# Patient Record
Sex: Female | Born: 1945 | Race: White | Hispanic: No | Marital: Married | Smoking: Never smoker
Health system: Southern US, Community
[De-identification: ages and names within clinical notes are randomized; demographics above are authoritative.]

## PROBLEM LIST (undated history)

## (undated) DIAGNOSIS — Z923 Personal history of irradiation: Secondary | ICD-10-CM

## (undated) DIAGNOSIS — Z9221 Personal history of antineoplastic chemotherapy: Secondary | ICD-10-CM

## (undated) DIAGNOSIS — Z978 Presence of other specified devices: Secondary | ICD-10-CM

## (undated) DIAGNOSIS — K219 Gastro-esophageal reflux disease without esophagitis: Secondary | ICD-10-CM

## (undated) DIAGNOSIS — T884XXA Failed or difficult intubation, initial encounter: Secondary | ICD-10-CM

## (undated) DIAGNOSIS — Z931 Gastrostomy status: Secondary | ICD-10-CM

## (undated) DIAGNOSIS — T7840XA Allergy, unspecified, initial encounter: Secondary | ICD-10-CM

## (undated) DIAGNOSIS — R42 Dizziness and giddiness: Secondary | ICD-10-CM

## (undated) DIAGNOSIS — C801 Malignant (primary) neoplasm, unspecified: Secondary | ICD-10-CM

## (undated) DIAGNOSIS — E079 Disorder of thyroid, unspecified: Secondary | ICD-10-CM

## (undated) DIAGNOSIS — I10 Essential (primary) hypertension: Secondary | ICD-10-CM

## (undated) HISTORY — PX: ABDOMINAL HYSTERECTOMY: SHX81

## (undated) HISTORY — PX: BREAST BIOPSY: SHX20

## (undated) HISTORY — DX: Allergy, unspecified, initial encounter: T78.40XA

## (undated) HISTORY — PX: OTHER SURGICAL HISTORY: SHX169

---

## 1996-07-22 DIAGNOSIS — C801 Malignant (primary) neoplasm, unspecified: Secondary | ICD-10-CM

## 1996-07-22 HISTORY — PX: RADICAL NECK DISSECTION: SHX2284

## 1996-07-22 HISTORY — PX: OTHER SURGICAL HISTORY: SHX169

## 1996-07-22 HISTORY — DX: Malignant (primary) neoplasm, unspecified: C80.1

## 1997-07-22 DIAGNOSIS — Z9221 Personal history of antineoplastic chemotherapy: Secondary | ICD-10-CM

## 1997-07-22 HISTORY — DX: Personal history of antineoplastic chemotherapy: Z92.21

## 2005-09-05 ENCOUNTER — Ambulatory Visit: Payer: Self-pay | Admitting: Family Medicine

## 2007-07-06 ENCOUNTER — Ambulatory Visit: Payer: Self-pay | Admitting: Family Medicine

## 2008-05-13 ENCOUNTER — Ambulatory Visit: Payer: Self-pay | Admitting: Family Medicine

## 2009-01-18 ENCOUNTER — Ambulatory Visit: Payer: Self-pay | Admitting: Family Medicine

## 2009-03-10 ENCOUNTER — Ambulatory Visit: Payer: Self-pay | Admitting: Otolaryngology

## 2009-03-13 ENCOUNTER — Ambulatory Visit: Payer: Self-pay | Admitting: Otolaryngology

## 2009-03-20 ENCOUNTER — Ambulatory Visit: Payer: Self-pay | Admitting: Otolaryngology

## 2009-04-04 ENCOUNTER — Ambulatory Visit: Payer: Self-pay | Admitting: Oncology

## 2009-04-21 ENCOUNTER — Ambulatory Visit: Payer: Self-pay | Admitting: Oncology

## 2009-04-25 ENCOUNTER — Ambulatory Visit: Payer: Self-pay | Admitting: Oncology

## 2009-05-01 ENCOUNTER — Ambulatory Visit: Payer: Self-pay | Admitting: Oncology

## 2009-05-22 ENCOUNTER — Ambulatory Visit: Payer: Self-pay | Admitting: Oncology

## 2009-10-20 ENCOUNTER — Ambulatory Visit: Payer: Self-pay

## 2009-11-07 ENCOUNTER — Ambulatory Visit: Payer: Self-pay

## 2009-11-19 ENCOUNTER — Ambulatory Visit: Payer: Self-pay

## 2010-04-21 ENCOUNTER — Ambulatory Visit: Payer: Self-pay | Admitting: Oncology

## 2010-05-09 ENCOUNTER — Ambulatory Visit: Payer: Self-pay | Admitting: Oncology

## 2010-05-22 ENCOUNTER — Ambulatory Visit: Payer: Self-pay | Admitting: Oncology

## 2011-03-03 ENCOUNTER — Ambulatory Visit: Payer: Self-pay

## 2011-07-17 DIAGNOSIS — M810 Age-related osteoporosis without current pathological fracture: Secondary | ICD-10-CM | POA: Insufficient documentation

## 2011-10-09 DIAGNOSIS — C050 Malignant neoplasm of hard palate: Secondary | ICD-10-CM | POA: Insufficient documentation

## 2011-10-09 DIAGNOSIS — C01 Malignant neoplasm of base of tongue: Secondary | ICD-10-CM | POA: Insufficient documentation

## 2012-10-01 ENCOUNTER — Ambulatory Visit: Payer: Self-pay | Admitting: Family Medicine

## 2014-04-27 DIAGNOSIS — E039 Hypothyroidism, unspecified: Secondary | ICD-10-CM | POA: Insufficient documentation

## 2014-05-23 DIAGNOSIS — R1312 Dysphagia, oropharyngeal phase: Secondary | ICD-10-CM | POA: Insufficient documentation

## 2014-07-12 ENCOUNTER — Ambulatory Visit: Payer: Self-pay | Admitting: Otolaryngology

## 2014-09-27 ENCOUNTER — Ambulatory Visit: Payer: Self-pay | Admitting: Physician Assistant

## 2014-12-14 DIAGNOSIS — H9190 Unspecified hearing loss, unspecified ear: Secondary | ICD-10-CM | POA: Insufficient documentation

## 2014-12-14 DIAGNOSIS — H6980 Other specified disorders of Eustachian tube, unspecified ear: Secondary | ICD-10-CM | POA: Insufficient documentation

## 2014-12-14 DIAGNOSIS — K1370 Unspecified lesions of oral mucosa: Secondary | ICD-10-CM | POA: Insufficient documentation

## 2015-02-08 ENCOUNTER — Other Ambulatory Visit: Payer: Self-pay | Admitting: Pediatrics

## 2015-02-08 DIAGNOSIS — Z1231 Encounter for screening mammogram for malignant neoplasm of breast: Secondary | ICD-10-CM

## 2015-02-09 ENCOUNTER — Ambulatory Visit
Admission: RE | Admit: 2015-02-09 | Discharge: 2015-02-09 | Disposition: A | Discharge status: Home / Self Care | Payer: Medicare PPO | Source: Ambulatory Visit | Attending: Pediatrics | Admitting: Pediatrics

## 2015-02-09 DIAGNOSIS — Z1231 Encounter for screening mammogram for malignant neoplasm of breast: Secondary | ICD-10-CM | POA: Diagnosis not present

## 2015-02-09 HISTORY — DX: Malignant (primary) neoplasm, unspecified: C80.1

## 2015-04-18 DIAGNOSIS — E079 Disorder of thyroid, unspecified: Secondary | ICD-10-CM | POA: Insufficient documentation

## 2016-02-13 DIAGNOSIS — J324 Chronic pansinusitis: Secondary | ICD-10-CM | POA: Insufficient documentation

## 2016-04-16 ENCOUNTER — Ambulatory Visit
Admission: RE | Admit: 2016-04-16 | Discharge: 2016-04-16 | Disposition: A | Discharge status: Home / Self Care | Payer: Medicare PPO | Source: Ambulatory Visit | Attending: Specialist | Admitting: Specialist

## 2016-04-16 ENCOUNTER — Other Ambulatory Visit: Payer: Self-pay | Admitting: Specialist

## 2016-04-16 DIAGNOSIS — R05 Cough: Secondary | ICD-10-CM

## 2016-04-16 DIAGNOSIS — R059 Cough, unspecified: Secondary | ICD-10-CM

## 2016-04-30 ENCOUNTER — Other Ambulatory Visit: Payer: Self-pay | Admitting: Specialist

## 2016-04-30 DIAGNOSIS — R05 Cough: Secondary | ICD-10-CM

## 2016-04-30 DIAGNOSIS — R9389 Abnormal findings on diagnostic imaging of other specified body structures: Secondary | ICD-10-CM

## 2016-04-30 DIAGNOSIS — R059 Cough, unspecified: Secondary | ICD-10-CM

## 2016-05-02 DIAGNOSIS — Z431 Encounter for attention to gastrostomy: Secondary | ICD-10-CM | POA: Insufficient documentation

## 2016-05-02 DIAGNOSIS — Z931 Gastrostomy status: Secondary | ICD-10-CM | POA: Insufficient documentation

## 2016-06-17 DIAGNOSIS — Z431 Encounter for attention to gastrostomy: Secondary | ICD-10-CM | POA: Insufficient documentation

## 2016-06-28 ENCOUNTER — Encounter: Payer: Self-pay | Admitting: Family Medicine

## 2016-06-28 ENCOUNTER — Ambulatory Visit (INDEPENDENT_AMBULATORY_CARE_PROVIDER_SITE_OTHER): Payer: Medicare PPO | Admitting: Family Medicine

## 2016-06-28 ENCOUNTER — Other Ambulatory Visit: Payer: Self-pay

## 2016-06-28 VITALS — BP 98/78 | HR 72 | Resp 16 | Ht 59.0 in | Wt 104.0 lb

## 2016-06-28 DIAGNOSIS — E039 Hypothyroidism, unspecified: Secondary | ICD-10-CM | POA: Diagnosis not present

## 2016-06-28 DIAGNOSIS — M7582 Other shoulder lesions, left shoulder: Secondary | ICD-10-CM | POA: Diagnosis not present

## 2016-06-28 DIAGNOSIS — C050 Malignant neoplasm of hard palate: Secondary | ICD-10-CM | POA: Diagnosis not present

## 2016-06-28 DIAGNOSIS — Z79899 Other long term (current) drug therapy: Secondary | ICD-10-CM | POA: Diagnosis not present

## 2016-06-28 DIAGNOSIS — C801 Malignant (primary) neoplasm, unspecified: Secondary | ICD-10-CM | POA: Insufficient documentation

## 2016-06-28 DIAGNOSIS — J309 Allergic rhinitis, unspecified: Secondary | ICD-10-CM

## 2016-06-28 DIAGNOSIS — Z931 Gastrostomy status: Secondary | ICD-10-CM | POA: Diagnosis not present

## 2016-06-28 DIAGNOSIS — I10 Essential (primary) hypertension: Secondary | ICD-10-CM | POA: Insufficient documentation

## 2016-06-28 DIAGNOSIS — E079 Disorder of thyroid, unspecified: Secondary | ICD-10-CM | POA: Insufficient documentation

## 2016-06-28 NOTE — Progress Notes (Addendum)
Date:  06/28/2016   Name:  Crystal Kent   DOB:  10/05/1945   MRN:  [id number]  PCP:  Adline Potter, MD    Chief Complaint: Establish Care and Arm Pain (left arm pain off and on x few weeks. No injury involved. ROM is different )   History of Present Illness:  This is a 70 y.o. female seen for initial visit, was seeing [facility] primary care. Sees [facility] for hx multiple oral cancers and [facility] surgery for PEG tube maintenance, on tube feedings for years. BP usually elevated at MD's office, 157/89 last month at [facility]. Uses Flonase prn allergies. TSH last month normal. On Prilosec since PEG placed. Has had flu, pneumo, and zoster imms, colonoscopy < 3 yrs ago, last mammo 01/2015. C/o anterior L shoulder pain x 3 wks after playing golf for first time in a while, slowly improving. No other new concerns.  Review of Systems:  Review of Systems  Constitutional: Negative for activity change, appetite change, fever and unexpected weight change.  Eyes: Negative for pain.  Respiratory: Negative for cough and shortness of breath.   Cardiovascular: Negative for chest pain, palpitations and leg swelling.  Gastrointestinal: Negative for abdominal pain.  Endocrine: Negative for polyuria.  Genitourinary: Negative for difficulty urinating, flank pain and pelvic pain.  Musculoskeletal: Negative for back pain and neck pain.  Neurological: Negative for dizziness, tremors and syncope.  Hematological: Negative for adenopathy.  Psychiatric/Behavioral: Negative for agitation and confusion.    Patient Active Problem List   Diagnosis Date Noted  . 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 Hypertension 06/28/2016  . Allergic rhinitis 06/28/2016  . Current use of proton pump inhibitor 06/28/2016  . Attention to G-tube ([place]) 06/17/2016  . Attention to gastrostomy tube ([place]) 05/02/2016  . 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 Chronic pansinusitis 02/13/2016  . Eustachian tube dysfunction 12/14/2014  .  Hearing loss 12/14/2014  . Lesion of buccal mucosa 12/14/2014  . Dysphagia, oropharyngeal 05/23/2014  . Hypothyroidism 04/27/2014  . 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 Post-menopausal osteoporosis 07/17/2011    Prior to Admission medications   Medication Sig Start Date End Date Taking? Authorizing Provider  amLODipine (NORVASC) 5 MG tablet TAKE ONE TABLET BY MOUTH EVERY DAY 06/25/12  Yes Historical Provider, MD  fluticasone (FLONASE) 50 MCG/ACT nasal spray 2 sprays per nostril every morning 04/27/13  Yes Historical Provider, MD  levothyroxine (SYNTHROID, LEVOTHROID) 75 MCG tablet TAKE ONE TABLET BY MOUTH EVERY DAY 06/25/12  Yes Historical Provider, MD  nystatin cream (MYCOSTATIN) once daily as needed.  05/02/16  Yes Historical Provider, MD  omeprazole (PRILOSEC) 20 MG capsule TAKE ONE CAPSULE BY MOUTH EVERY DAY 10/25/11  Yes Historical Provider, MD    No Known Allergies  Past Surgical History:  Procedure Laterality Date  . BREAST BIOPSY Left    bx/clip-neg    Social History  Substance Use Topics  . Smoking status: Never Smoker  . Smokeless tobacco: Never Used  . Alcohol use No    Family History  Problem Relation Age of Onset  . Alzheimer's disease Mother   . Heart attack Father     Medication list has been reviewed and updated.  Physical Examination: BP 98/78   Pulse 72   Resp 16   Ht 4\' 11"  (1.499 m)   Wt 104 lb (47.2 kg)   BMI 21.01 kg/m  Physical Exam  Constitutional: She is oriented to person, place, and time. She appears well-developed and well-nourished.  HENT:  S/p multiple oral surgeries B TM's with extensive scarring  Eyes: Conjunctivae and EOM are normal. Pupils are equal, round, and reactive to light. No scleral icterus.  Neck: Neck supple. No thyromegaly present.  Cardiovascular: Normal rate, regular rhythm and normal heart sounds.   Exam reveals no gallop.   No murmur heard. Pulmonary/Chest: Effort normal and breath sounds normal. She has no wheezes. She has no rales.  Abdominal: Soft. Bowel sounds are normal. She exhibits no distension and no mass. There is no tenderness.  PEG tube in place, site clean  Musculoskeletal: She exhibits no edema.  L anterior rotator cuff tenderness, increased pain with abduction > 90 degrees  Lymphadenopathy:    She has no cervical adenopathy.  Neurological: She is alert and oriented to person, place, and time. Coordination normal.  Skin: Skin is warm and dry. No rash noted.  Psychiatric: She has a normal mood and affect. Her behavior is normal.  Nursing note and vitals reviewed.   Assessment and Plan:  1. Essential hypertension Overcontrolled today but poorly controlled recently, cont current dose amlodipine - Comprehensive Metabolic Panel (CMET) - CBC  2. Acquired hypothyroidism Well controlled on current dose Synthroid - Vitamin D (25 hydroxy)  3. Malignant neoplasm of hard palate [facility]) S/p multiple resections, followed by [facility] ENT  4. Gastrostomy in place [facility]) In place for years, followed by [facility] surgery  5. Chronic allergic rhinitis, unspecified seasonality, unspecified trigger Well controlled on prn Flonase  6. Current use of proton pump inhibitor Continue Prilosec given LT PEG use - B12  7. L rotator cuff tendonitis Expect resolution as no golf over winter, consider PT referral if persists  Return in about 6 months (around 12/27/2016).  [name]. [facility]  06/28/2016

## 2016-06-29 LAB — VITAMIN D 25 HYDROXY (VIT D DEFICIENCY, FRACTURES): VIT D 25 HYDROXY: 36.8 ng/mL (ref 30.0–100.0)

## 2016-06-29 LAB — COMPREHENSIVE METABOLIC PANEL
ALBUMIN: 4.8 g/dL (ref 3.5–4.8)
ALK PHOS: 118 IU/L — AB (ref 39–117)
ALT: 27 IU/L (ref 0–32)
AST: 36 IU/L (ref 0–40)
Albumin/Globulin Ratio: 2.1 (ref 1.2–2.2)
BILIRUBIN TOTAL: 0.6 mg/dL (ref 0.0–1.2)
BUN / CREAT RATIO: 26 (ref 12–28)
BUN: 18 mg/dL (ref 8–27)
CHLORIDE: 95 mmol/L — AB (ref 96–106)
CO2: 26 mmol/L (ref 18–29)
CREATININE: 0.7 mg/dL (ref 0.57–1.00)
Calcium: 9.5 mg/dL (ref 8.7–10.3)
GFR calc non Af Amer: 87 mL/min/{1.73_m2} (ref >59)
GFR, EST AFRICAN AMERICAN: 101 mL/min/{1.73_m2} (ref >59)
GLOBULIN, TOTAL: 2.3 g/dL (ref 1.5–4.5)
Glucose: 85 mg/dL (ref 65–99)
Potassium: 4.2 mmol/L (ref 3.5–5.2)
SODIUM: 136 mmol/L (ref 134–144)
Total Protein: 7.1 g/dL (ref 6.0–8.5)

## 2016-06-29 LAB — CBC
HEMATOCRIT: 35.1 % (ref 34.0–46.6)
Hemoglobin: 12 g/dL (ref 11.1–15.9)
MCH: 30.7 pg (ref 26.6–33.0)
MCHC: 34.2 g/dL (ref 31.5–35.7)
MCV: 90 fL (ref 79–97)
Platelets: 194 10*3/uL (ref 150–379)
RBC: 3.91 x10E6/uL (ref 3.77–5.28)
RDW: 13.6 % (ref 12.3–15.4)
WBC: 3.7 10*3/uL (ref 3.4–10.8)

## 2016-06-29 LAB — VITAMIN B12: VITAMIN B 12: 1096 pg/mL (ref 232–1245)

## 2016-07-25 ENCOUNTER — Ambulatory Visit: Payer: Self-pay | Admitting: Family Medicine

## 2016-07-26 ENCOUNTER — Emergency Department: Payer: Medicare HMO

## 2016-07-26 ENCOUNTER — Encounter: Payer: Self-pay | Admitting: Emergency Medicine

## 2016-07-26 ENCOUNTER — Inpatient Hospital Stay
Admission: EM | Admit: 2016-07-26 | Discharge: 2016-07-27 | DRG: 535 | Disposition: A | Discharge status: Home / Self Care | Payer: Medicare HMO | Attending: Internal Medicine | Admitting: Internal Medicine

## 2016-07-26 DIAGNOSIS — S32602A Unspecified fracture of left ischium, initial encounter for closed fracture: Secondary | ICD-10-CM

## 2016-07-26 DIAGNOSIS — I959 Hypotension, unspecified: Secondary | ICD-10-CM | POA: Diagnosis not present

## 2016-07-26 DIAGNOSIS — J189 Pneumonia, unspecified organism: Secondary | ICD-10-CM | POA: Diagnosis present

## 2016-07-26 DIAGNOSIS — Z79899 Other long term (current) drug therapy: Secondary | ICD-10-CM

## 2016-07-26 DIAGNOSIS — E079 Disorder of thyroid, unspecified: Secondary | ICD-10-CM | POA: Diagnosis not present

## 2016-07-26 DIAGNOSIS — Z931 Gastrostomy status: Secondary | ICD-10-CM

## 2016-07-26 DIAGNOSIS — K219 Gastro-esophageal reflux disease without esophagitis: Secondary | ICD-10-CM | POA: Diagnosis present

## 2016-07-26 DIAGNOSIS — Y92009 Unspecified place in unspecified non-institutional (private) residence as the place of occurrence of the external cause: Secondary | ICD-10-CM | POA: Diagnosis not present

## 2016-07-26 DIAGNOSIS — J159 Unspecified bacterial pneumonia: Secondary | ICD-10-CM | POA: Diagnosis not present

## 2016-07-26 DIAGNOSIS — W010XXA Fall on same level from slipping, tripping and stumbling without subsequent striking against object, initial encounter: Secondary | ICD-10-CM | POA: Diagnosis present

## 2016-07-26 DIAGNOSIS — Z9221 Personal history of antineoplastic chemotherapy: Secondary | ICD-10-CM | POA: Diagnosis not present

## 2016-07-26 DIAGNOSIS — Z85810 Personal history of malignant neoplasm of tongue: Secondary | ICD-10-CM | POA: Diagnosis not present

## 2016-07-26 DIAGNOSIS — R2681 Unsteadiness on feet: Secondary | ICD-10-CM

## 2016-07-26 DIAGNOSIS — T461X5A Adverse effect of calcium-channel blockers, initial encounter: Secondary | ICD-10-CM | POA: Diagnosis present

## 2016-07-26 DIAGNOSIS — C069 Malignant neoplasm of mouth, unspecified: Secondary | ICD-10-CM | POA: Diagnosis not present

## 2016-07-26 DIAGNOSIS — Z923 Personal history of irradiation: Secondary | ICD-10-CM

## 2016-07-26 DIAGNOSIS — I10 Essential (primary) hypertension: Secondary | ICD-10-CM | POA: Diagnosis present

## 2016-07-26 DIAGNOSIS — M25552 Pain in left hip: Secondary | ICD-10-CM | POA: Diagnosis present

## 2016-07-26 DIAGNOSIS — S32512A Fracture of superior rim of left pubis, initial encounter for closed fracture: Secondary | ICD-10-CM | POA: Diagnosis not present

## 2016-07-26 DIAGNOSIS — R55 Syncope and collapse: Secondary | ICD-10-CM | POA: Diagnosis not present

## 2016-07-26 DIAGNOSIS — S32599A Other specified fracture of unspecified pubis, initial encounter for closed fracture: Secondary | ICD-10-CM | POA: Diagnosis not present

## 2016-07-26 HISTORY — DX: Disorder of thyroid, unspecified: E07.9

## 2016-07-26 HISTORY — DX: Essential (primary) hypertension: I10

## 2016-07-26 HISTORY — DX: Gastro-esophageal reflux disease without esophagitis: K21.9

## 2016-07-26 LAB — CBC
HEMATOCRIT: 35.5 % (ref 35.0–47.0)
HEMOGLOBIN: 12.3 g/dL (ref 12.0–16.0)
MCH: 31.5 pg (ref 26.0–34.0)
MCHC: 34.6 g/dL (ref 32.0–36.0)
MCV: 91.1 fL (ref 80.0–100.0)
Platelets: 249 10*3/uL (ref 150–440)
RBC: 3.9 MIL/uL (ref 3.80–5.20)
RDW: 13 % (ref 11.5–14.5)
WBC: 20.5 10*3/uL — AB (ref 3.6–11.0)

## 2016-07-26 LAB — URINALYSIS, COMPLETE (UACMP) WITH MICROSCOPIC
BACTERIA UA: NONE SEEN
BILIRUBIN URINE: NEGATIVE
Glucose, UA: NEGATIVE mg/dL
HGB URINE DIPSTICK: NEGATIVE
KETONES UR: 5 mg/dL — AB
LEUKOCYTES UA: NEGATIVE
NITRITE: NEGATIVE
Protein, ur: 30 mg/dL — AB
SPECIFIC GRAVITY, URINE: 1.019 (ref 1.005–1.030)
pH: 7 (ref 5.0–8.0)

## 2016-07-26 LAB — BASIC METABOLIC PANEL
ANION GAP: 6 (ref 5–15)
BUN: 25 mg/dL — ABNORMAL HIGH (ref 6–20)
CHLORIDE: 100 mmol/L — AB (ref 101–111)
CO2: 27 mmol/L (ref 22–32)
Calcium: 9.7 mg/dL (ref 8.9–10.3)
Creatinine, Ser: 0.96 mg/dL (ref 0.44–1.00)
GFR calc Af Amer: >60 mL/min (ref >60)
GFR, EST NON AFRICAN AMERICAN: 58 mL/min — AB (ref >60)
Glucose, Bld: 159 mg/dL — ABNORMAL HIGH (ref 65–99)
POTASSIUM: 4.5 mmol/L (ref 3.5–5.1)
SODIUM: 133 mmol/L — AB (ref 135–145)

## 2016-07-26 MED ORDER — HYDROMORPHONE HCL 1 MG/ML IJ SOLN
1.0000 mg | Freq: Once | INTRAMUSCULAR | Status: AC
  Administered 2016-07-26: 1 mg via INTRAVENOUS
  Filled 2016-07-26: qty 1

## 2016-07-26 MED ORDER — DEXTROSE 5 % IV SOLN
500.0000 mg | INTRAVENOUS | Status: DC
  Administered 2016-07-27: 500 mg via INTRAVENOUS
  Filled 2016-07-26: qty 500

## 2016-07-26 MED ORDER — LEVOTHYROXINE SODIUM 75 MCG PO TABS
75.0000 ug | ORAL_TABLET | Freq: Every day | ORAL | Status: DC
  Administered 2016-07-27: 75 ug via ORAL
  Filled 2016-07-26: qty 1

## 2016-07-26 MED ORDER — DEXTROSE 5 % IV SOLN: 1.0000 g | INTRAVENOUS | Status: DC

## 2016-07-26 MED ORDER — PANTOPRAZOLE SODIUM 40 MG PO TBEC
40.0000 mg | DELAYED_RELEASE_TABLET | Freq: Every day | ORAL | Status: DC
  Administered 2016-07-27: 40 mg via ORAL
  Filled 2016-07-26: qty 1

## 2016-07-26 MED ORDER — DOCUSATE SODIUM 100 MG PO CAPS
100.0000 mg | ORAL_CAPSULE | Freq: Two times a day (BID) | ORAL | Status: DC
  Filled 2016-07-26 (×2): qty 1

## 2016-07-26 MED ORDER — AMLODIPINE BESYLATE 5 MG PO TABS
5.0000 mg | ORAL_TABLET | Freq: Every day | ORAL | Status: DC
  Filled 2016-07-26: qty 1

## 2016-07-26 MED ORDER — ONDANSETRON HCL 4 MG/2ML IJ SOLN: 4.0000 mg | Freq: Four times a day (QID) | INTRAMUSCULAR | Status: DC | PRN

## 2016-07-26 MED ORDER — HYDROCODONE-ACETAMINOPHEN 5-325 MG PO TABS
1.0000 | ORAL_TABLET | ORAL | Status: DC | PRN
  Administered 2016-07-27 (×2): 1
  Filled 2016-07-26 (×2): qty 1

## 2016-07-26 MED ORDER — ENOXAPARIN SODIUM 40 MG/0.4ML ~~LOC~~ SOLN
40.0000 mg | SUBCUTANEOUS | Status: DC
  Administered 2016-07-26: 40 mg via SUBCUTANEOUS
  Filled 2016-07-26: qty 0.4

## 2016-07-26 MED ORDER — BISACODYL 5 MG PO TBEC: 5.0000 mg | DELAYED_RELEASE_TABLET | Freq: Every day | ORAL | Status: DC | PRN

## 2016-07-26 MED ORDER — LIDOCAINE 5 % EX PTCH
1.0000 | MEDICATED_PATCH | CUTANEOUS | Status: DC
  Administered 2016-07-26: 1 via TRANSDERMAL
  Filled 2016-07-26 (×2): qty 1

## 2016-07-26 MED ORDER — CEFTRIAXONE SODIUM-DEXTROSE 1-3.74 GM-% IV SOLR
1.0000 g | INTRAVENOUS | Status: DC
  Filled 2016-07-26: qty 50

## 2016-07-26 MED ORDER — ONDANSETRON HCL 4 MG/2ML IJ SOLN
INTRAMUSCULAR | Status: AC
  Filled 2016-07-26: qty 2

## 2016-07-26 MED ORDER — DEXTROSE 5 % IV SOLN: 1.0000 g | Freq: Once | INTRAVENOUS | Status: DC

## 2016-07-26 MED ORDER — ACETAMINOPHEN 650 MG RE SUPP: 650.0000 mg | Freq: Four times a day (QID) | RECTAL | Status: DC | PRN

## 2016-07-26 MED ORDER — ONDANSETRON HCL 4 MG PO TABS: 4.0000 mg | ORAL_TABLET | Freq: Four times a day (QID) | ORAL | Status: DC | PRN

## 2016-07-26 MED ORDER — ONDANSETRON HCL 4 MG/2ML IJ SOLN
4.0000 mg | Freq: Once | INTRAMUSCULAR | Status: AC
  Administered 2016-07-26: 4 mg via INTRAVENOUS
  Filled 2016-07-26: qty 2

## 2016-07-26 MED ORDER — ACETAMINOPHEN 325 MG PO TABS: 650.0000 mg | ORAL_TABLET | Freq: Four times a day (QID) | ORAL | Status: DC | PRN

## 2016-07-26 MED ORDER — BOOST PLUS PO LIQD
237.0000 mL | Freq: Three times a day (TID) | ORAL | Status: DC
  Administered 2016-07-26: 237 mL via ORAL
  Filled 2016-07-26 (×4): qty 237

## 2016-07-26 MED ORDER — FLUTICASONE PROPIONATE 50 MCG/ACT NA SUSP
1.0000 | Freq: Every day | NASAL | Status: DC | PRN
  Filled 2016-07-26: qty 16

## 2016-07-26 MED ORDER — CEFTRIAXONE SODIUM-DEXTROSE 1-3.74 GM-% IV SOLR
1.0000 g | Freq: Once | INTRAVENOUS | Status: AC
  Administered 2016-07-26: 1 g via INTRAVENOUS
  Filled 2016-07-26: qty 50

## 2016-07-26 MED ORDER — HYDROCODONE-ACETAMINOPHEN 5-325 MG PO TABS: 1.0000 | ORAL_TABLET | ORAL | Status: DC | PRN

## 2016-07-26 MED ORDER — ONDANSETRON HCL 4 MG/2ML IJ SOLN
4.0000 mg | Freq: Once | INTRAMUSCULAR | Status: AC
  Administered 2016-07-26: 4 mg via INTRAVENOUS

## 2016-07-26 NOTE — ED Notes (Signed)
Comfort measures given.  Pt states "i dont hurt as long as im sitting still."  Charge ns aware, will bed pt when available.

## 2016-07-26 NOTE — ED Notes (Signed)
Pt is able to move bilateral leg and feet however is not able to stand and put pressure to left leg and foot. Pulses intact to bilateral pedal pulses.

## 2016-07-26 NOTE — ED Provider Notes (Signed)
Time Seen: Approximately 1554 I have reviewed the triage notes  Chief Complaint: Loss of Consciousness and Hip Pain   History of Present Illness: Crystal Kent is a 71 y.o. female who presents after a fall this morning. Patient was on her way to the bathroom when she states she lost her balance and fell landing primarily on her left side. She denies any head trauma or loss of consciousness at that time. Her husband who is with her states that he went to get her up off the floor and she may have passed out for a brief period of time and complained of some nausea at the time. No persistent vomiting. Patient's been unable to bear weight on her left side. She denies any left shoulder pain. She denies any neck, thoracic or lumbar spine pain. She has a significant past history of malignant neoplasm of the hard palate and base of the tongue.   Past Medical History:  Diagnosis Date  . Acid reflux   . 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 Hypertension   . Thyroid disease     Patient Active Problem List   Diagnosis Date Noted  . 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 Hypertension 06/28/2016  . Allergic rhinitis 06/28/2016  . Current use of proton pump inhibitor 06/28/2016  . Attention to G-tube ([place]) 06/17/2016  . Attention to gastrostomy tube ([place]) 05/02/2016  . Gastrostomy in place [facility]) 05/02/2016  . Chronic pansinusitis 02/13/2016  . Eustachian tube dysfunction 12/14/2014  . Hearing loss 12/14/2014  . Lesion of buccal mucosa 12/14/2014  . Dysphagia, oropharyngeal 05/23/2014  . Hypothyroidism 04/27/2014  . 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 Post-menopausal osteoporosis 07/17/2011    Past Surgical History:  Procedure Laterality Date  . ABDOMINAL HYSTERECTOMY    . BREAST BIOPSY Left    bx/clip-neg  . MOUTH SURGERY     surgery after cancer     Past Surgical History:  Procedure Laterality Date  . ABDOMINAL HYSTERECTOMY    . BREAST BIOPSY Left    bx/clip-neg  . MOUTH SURGERY     surgery after cancer    Current Outpatient Rx  . Order #: [id number] Class: Historical Med  . Order #: [id number] Class: Historical Med  . Order #: [id number] Class: Historical Med  . Order #: [id number] Class: Historical Med  . Order #: [id number] Class: Historical Med    Allergies:  Patient has no known allergies.  Family History: Family History  Problem Relation Age of Onset  . Alzheimer's disease Mother   . Heart attack Father     Social History: Social History  Substance Use Topics  . Smoking status: Never Smoker  . Smokeless tobacco: Never Used  . Alcohol use No     Review of Systems:   10 point review of systems was performed and was otherwise negative:  Constitutional: No fever Eyes: No visual disturbances ENT: No sore throat, ear pain Cardiac: No chest pain Respiratory: No shortness of breath, wheezing, or stridor Abdomen: No abdominal pain, no vomiting, No diarrhea Endocrine: No weight loss, No night sweats Extremities: Pain primarily in the left hip region. Skin: No rashes, easy bruising Neurologic: No focal weakness, trouble with speech or swollowing Urologic: No dysuria, Hematuria, or urinary frequency   Physical Exam:  ED Triage Vitals  Enc Vitals Group     BP 07/26/16 1229 (!) 140/96  Pulse Rate 07/26/16 1229 (!) 110     Resp 07/26/16 1229 18     Temp 07/26/16 1229 97.4 F (36.3 C)     Temp Source 07/26/16 1229 Oral     SpO2 07/26/16 1229 92 %     Weight 07/26/16 1230 104 lb (47.2 kg)     Height 07/26/16 1230 5' (1.524 m)     Head Circumference --      Peak Flow --      Pain Score 07/26/16 1539 7     Pain Loc --      Pain Edu? --      Excl. in [place]? --     General: Awake , Alert , and Oriented times 3; GCS 15 Postoperative findings of the face and neck Head: Normal cephalic , atraumatic Eyes:  Pupils equal , round, reactive to light Nose/Throat: No nasal drainage, patent upper airway without erythema or exudate.  Neck: Supple, Full range of motion, No anterior adenopathy or palpable thyroid masses Lungs: Clear to ascultation without wheezes , rhonchi, or rales Heart: Regular rate, regular rhythm without murmurs , gallops , or rubs Abdomen: Soft, non tender without rebound, guarding , or rigidity; bowel sounds positive and symmetric in all 4 quadrants. No organomegaly .        Extremities: 2 plus symmetric pulses. No edema, clubbing or cyanosis. No shoulder, left knee or left ankle pain. Neurologic: normal ambulation, Motor symmetric without deficits, sensory intact Skin: warm, dry, no rashes Tenderness over the left pelvic region without obvious crepitus or step-off noted.  Labs:   All laboratory work was reviewed including any pertinent negatives or positives listed below:  Labs Reviewed  BASIC METABOLIC PANEL - Abnormal; Notable for the following:       Result Value   Sodium 133 (*)    Chloride 100 (*)    Glucose, Bld 159 (*)    BUN 25 (*)    GFR calc non Af Amer 58 (*)    All other components within normal limits  CBC - Abnormal; Notable for the following:    WBC 20.5 (*)    All other components within normal limits  URINALYSIS, COMPLETE (UACMP) WITH MICROSCOPIC - Abnormal; Notable for the following:    Color, Urine YELLOW (*)    APPearance CLEAR (*)    Ketones, ur 5 (*)    Protein, ur 30 (*)    Squamous Epithelial / LPF 0-5 (*)    All other components within normal limits    EKG: ED ECG REPORT I, Daymon Larsen, the attending physician, personally viewed and interpreted this ECG.  Date: 07/26/2016 EKG Time: 1228 Rate: *104 Rhythm: Sinus tachycardia with occasional PVCs QRS Axis: normal Intervals: normal ST/T Wave abnormalities: normal Conduction Disturbances: none Narrative Interpretation: unremarkable No acute ischemic changes noted   Radiology:  * "Dg Chest 2 View  Result Date: 07/26/2016 CLINICAL DATA:  Syncope EXAM: CHEST  2 VIEW COMPARISON:  04/16/2016 FINDINGS: There is hazy airspace disease in the right upper lobe. Right apical pleural thickening is stable. No pneumothorax. Normal heart size. No pleural effusion. IMPRESSION: There is airspace disease in the right upper lobe. Given the history of syncope, pulmonary contusion is not excluded. Findings may represent underlying pneumonia. Followup PA and lateral chest X-ray is recommended in 3-4 weeks following trial of antibiotic therapy to ensure resolution and exclude underlying malignancy. Electronically Signed   By: Marybelle Killings M.D.   On: 07/26/2016 16:27   Dg Hip  Unilat With Pelvis 2-3 Views Left  Result Date: 07/26/2016 CLINICAL DATA:  Fall. EXAM: DG HIP (WITH OR WITHOUT PELVIS) 2-3V LEFT COMPARISON:  No recent prior . FINDINGS: Displaced fractures of the left superior inferior pubic rami noted. No other focal abnormality identified. Diffuse osteopenia degenerative change. Peripheral vascular calcification. IMPRESSION: 1. Displaced fractures of the left superior and inferior pubic rami. 2. Peripheral vascular disease. Electronically Signed   By: Marcello Moores  Register   On: 07/26/2016 13:55  "  I personally reviewed the radiologic studies    ED Course:  Findings seen on chest x-ray were unlikely to be a pulmonary contusion based on the clinical presentation. Given the elevated white blood cell count I felt it may be early pneumonia though the patient has no complaints of chest pain, shortness of breath. Chest x-ray was mainly performed due to the elevated white blood cell count and syncopal episode. His clinical evidence of a pulmonary embolism at this time though she is at risk due to her oncologic history  Clinical Course      Assessment: Closed fracture left pelvis Brief syncopal episode possibly vasovagal Right upper lobe infiltrate possible early pneumonia  Final Clinical  Impression:  Final diagnoses:  Closed displaced fracture of left ischium, unspecified fracture morphology, initial encounter [facility])     Plan:  Inpatient            Daymon Larsen, MD 07/26/16 1658

## 2016-07-26 NOTE — H&P (Signed)
[place] [facility] [place] NAME: Crystal Kent    MR#:  [id number]  DATE OF BIRTH:  1946/03/11  DATE OF ADMISSION:  07/26/2016  PRIMARY CARE PHYSICIAN: Adline Potter, MD   REQUESTING/REFERRING PHYSICIAN: Dr. Meade Maw  CHIEF COMPLAINT: Fall, left hip pain    Chief Complaint  Patient presents with  . Loss of Consciousness  . Hip Pain    HISTORY OF PRESENT ILLNESS:  Crystal Kent  is a 71 y.o. female with a known history ofOral cancer status post facial reconstruction, PEG tube feedings all the time comes in because of episode of fall this morning around 4 AM due to balance problem and she fell on her left side after the fall patient able not able to walk having severe pain in the left side. X-rays of the left hip showed pubic ramus fracture on the left side. Patient says that she never lost consciousness. Patient also having some cough for the past 2 days, chest x-ray showed right-sided pneumonia. No shortness of breath, no fever. Patient had history of oral cancer status post chemotherapy, radiation, reconstructive surgery 20 years ago, and patient has PEG tube feedings since then.  PAST MEDICAL HISTORY:   Past Medical History:  Diagnosis Date  . Acid reflux   . 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 Hypertension   . Thyroid disease     PAST SURGICAL HISTOIRY:   Past Surgical History:  Procedure Laterality Date  . ABDOMINAL HYSTERECTOMY    . BREAST BIOPSY Left    bx/clip-neg  . MOUTH SURGERY     surgery after cancer    SOCIAL HISTORY:   Social History  Substance Use Topics  . Smoking status: Never Smoker  . Smokeless tobacco: Never Used  . Alcohol use No    FAMILY HISTORY:   Family History  Problem Relation Age of Onset  . Alzheimer's disease Mother   . Heart attack Father     DRUG ALLERGIES:  No Known Allergies  REVIEW OF SYSTEMS:  CONSTITUTIONAL: No fever, fatigue or weakness.speech  difficulty because of facial reconstruction and previous surgeries   EYES: No blurred or double vision.  EARS, NOSE, AND THROAT: No tinnitus or ear pain.  RESPIRATORY: No cough, shortness of breath, wheezing or hemoptysis.  CARDIOVASCULAR: No chest pain, orthopnea, edema.  GASTROINTESTINAL: No nausea, vomiting, diarrhea or abdominal pain.  GENITOURINARY: No dysuria, hematuria.  ENDOCRINE: No polyuria, nocturia,  HEMATOLOGY: No anemia, easy bruising or bleeding SKIN: No rash or lesion. MUSCULOSKELETAL: No joint pain or arthritis.   NEUROLOGIC: No tingling, numbness, weakness/ Peg tube in mid epigastric region.     MEDICATIONS AT HOME:   Prior to Admission medications   Medication Sig Start Date End Date Taking? Authorizing Provider  amLODipine (NORVASC) 5 MG tablet TAKE ONE TABLET BY MOUTH EVERY DAY 06/25/12  Yes Historical Provider, MD  fluticasone (FLONASE) 50 MCG/ACT nasal spray 2 sprays per nostril every morning 04/27/13  Yes Historical Provider, MD  levothyroxine (SYNTHROID, LEVOTHROID) 75 MCG tablet TAKE ONE TABLET BY MOUTH EVERY DAY 06/25/12  Yes Historical Provider, MD  nystatin cream (MYCOSTATIN) once daily as needed.  05/02/16  Yes Historical Provider, MD  omeprazole (PRILOSEC) 20 MG capsule TAKE ONE CAPSULE BY MOUTH EVERY DAY 10/25/11  Yes Historical Provider, MD      VITAL SIGNS:  Blood pressure 108/69, pulse 88, temperature 97.4 F (36.3 C), temperature source Oral, resp. rate (!) 21, height 5' (1.524 m),  weight 47.2 kg (104 lb), SpO2 91 %.  PHYSICAL EXAMINATION:  GENERAL:  71 y.o.-year-old patient lying in the bed with no acute distress. Facial deformity,difficulty  talking and understanding, EYES: Pupils equal, round, reactive to light and accommodation. No scleral icterus. Extraocular muscles intact.  HEENT: Head atraumatic, normocephalic. Oropharynx and nasopharynx clear.  NECK:  Supple, no jugular venous distention. No thyroid enlargement, no tenderness.  LUNGS:  Normal breath sounds bilaterally, no wheezing, rales,rhonchi or crepitation. No use of accessory muscles of respiration.  CARDIOVASCULAR: S1, S2 normal. No murmurs, rubs, or gallops.  ABDOMEN: Soft, nontender, nondistended. Bowel sounds present. No organomegaly or mass. Peg tube in mid epigastric area,skin under peg site clean., EXTREMITIES: No pedal edema, cyanosis, or clubbing.  NEUROLOGIC: Cranial nerves II through XII are intact. Muscle strength 5/5 in all extremities. Sensation intact. Gait not checked.  PSYCHIATRIC: The patient is alert and oriented x 3.  SKIN: No obvious rash, lesion, or ulcer.   LABORATORY PANEL:   CBC  Recent Labs Lab 07/26/16 1236  WBC 20.5*  HGB 12.3  HCT 35.5  PLT 249   ------------------------------------------------------------------------------------------------------------------  Chemistries   Recent Labs Lab 07/26/16 1236  NA 133*  K 4.5  CL 100*  CO2 27  GLUCOSE 159*  BUN 25*  CREATININE 0.96  CALCIUM 9.7   ------------------------------------------------------------------------------------------------------------------  Cardiac Enzymes No results for input(s): TROPONINI in the last 168 hours. ------------------------------------------------------------------------------------------------------------------  RADIOLOGY:  Dg Chest 2 View  Result Date: 07/26/2016 CLINICAL DATA:  Syncope EXAM: CHEST  2 VIEW COMPARISON:  04/16/2016 FINDINGS: There is hazy airspace disease in the right upper lobe. Right apical pleural thickening is stable. No pneumothorax. Normal heart size. No pleural effusion. IMPRESSION: There is airspace disease in the right upper lobe. Given the history of syncope, pulmonary contusion is not excluded. Findings may represent underlying pneumonia. Followup PA and lateral chest X-ray is recommended in 3-4 weeks following trial of antibiotic therapy to ensure resolution and exclude underlying malignancy. Electronically Signed    By: Marybelle Killings M.D.   On: 07/26/2016 16:27   Dg Hip Unilat With Pelvis 2-3 Views Left  Result Date: 07/26/2016 CLINICAL DATA:  Fall. EXAM: DG HIP (WITH OR WITHOUT PELVIS) 2-3V LEFT COMPARISON:  No recent prior . FINDINGS: Displaced fractures of the left superior inferior pubic rami noted. No other focal abnormality identified. Diffuse osteopenia degenerative change. Peripheral vascular calcification. IMPRESSION: 1. Displaced fractures of the left superior and inferior pubic rami. 2. Peripheral vascular disease. Electronically Signed   By: Marcello Moores  Register   On: 07/26/2016 13:55    EKG:   Orders placed or performed during the hospital encounter of 07/26/16  . EKG 12-Lead  . EKG 12-Lead  . ED EKG  . ED EKG   sinus tachycardia with 10 4 bpm some PVCs.   IMPRESSION AND PLAN:   #45 .71 year old female patient with history of fall, left pubic ramus fracture comminuted treatment admitted to hospitalist service, pain medicines, patient received IV Dilaudid 1 mg but it gave her a lot of nausea. Continue Percocet for the PEG tube, continue IV Zofran for nausea.  #2 community-acquired pneumonia: Started on IV antibiotics with Rocephin, Zithromax. #3 history of oral cancer status post a chemotherapy, radiation, patient had removal of palate, base of the tongue. Gets bolus feeds with PEG through the PEG.    4/HYPOTHYROIDISM #5.full code All the records are reviewed and case discussed with ED provider. Management plans discussed with the patient, family and they are in agreement.  CODE STATUS: full code  TOTAL TIME TAKING CARE OF THIS PATIENT: 55 minutes.    [name] M.D on 07/26/2016 at 5:49 PM  Between 7am to 6pm - Pager - [phone]  After 6pm go to [email or web address] - password [email or web address] [facility]  Office  [phone]  CC: Primary care physician; Adline Potter, MD  Note: This dictation was prepared with Dragon dictation along with smaller phrase technology.  Any transcriptional errors that result from this process are unintentional.

## 2016-07-26 NOTE — ED Notes (Addendum)
Pt placed on 2L [place] after O2 stat dropped to 80% on RA with good waveform. Family reports pt was sleeping at the time of the drop. Pt in NAD at this time and denies SOB. Pt is now stating 99% on 2L.

## 2016-07-26 NOTE — ED Triage Notes (Signed)
Pt to ED from home with husband c/o syncopal episode this morning when going to the bathroom.  Denies hitting head, states pain to left groin and hip, was able to bear weight this morning.  Presents to ED A&Ox4, skin warm and dry, breathing unlabored.

## 2016-07-26 NOTE — ED Notes (Signed)
Pt has HX throat cancer X20 years off and on. Pt is cancer free at this time

## 2016-07-27 LAB — BASIC METABOLIC PANEL
ANION GAP: 6 (ref 5–15)
BUN: 23 mg/dL — ABNORMAL HIGH (ref 6–20)
CHLORIDE: 101 mmol/L (ref 101–111)
CO2: 27 mmol/L (ref 22–32)
Calcium: 9 mg/dL (ref 8.9–10.3)
Creatinine, Ser: 0.74 mg/dL (ref 0.44–1.00)
GFR calc non Af Amer: >60 mL/min (ref >60)
Glucose, Bld: 114 mg/dL — ABNORMAL HIGH (ref 65–99)
Potassium: 3.9 mmol/L (ref 3.5–5.1)
Sodium: 134 mmol/L — ABNORMAL LOW (ref 135–145)

## 2016-07-27 LAB — CBC
HEMATOCRIT: 31.1 % — AB (ref 35.0–47.0)
HEMOGLOBIN: 10.8 g/dL — AB (ref 12.0–16.0)
MCH: 32 pg (ref 26.0–34.0)
MCHC: 34.8 g/dL (ref 32.0–36.0)
MCV: 91.8 fL (ref 80.0–100.0)
Platelets: 210 10*3/uL (ref 150–440)
RBC: 3.39 MIL/uL — AB (ref 3.80–5.20)
RDW: 12.9 % (ref 11.5–14.5)
WBC: 10.1 10*3/uL (ref 3.6–11.0)

## 2016-07-27 MED ORDER — JEVITY 1.5 CAL/FIBER PO LIQD: 1000.0000 mL | ORAL | Status: DC

## 2016-07-27 MED ORDER — HYDROCODONE-ACETAMINOPHEN 5-325 MG PO TABS: 1.0000 | ORAL_TABLET | ORAL | 0 refills | Status: DC | PRN

## 2016-07-27 MED ORDER — AZITHROMYCIN 500 MG PO TABS: 500.0000 mg | ORAL_TABLET | Freq: Every day | ORAL | 0 refills | Status: DC

## 2016-07-27 MED ORDER — DOCUSATE SODIUM 50 MG/5ML PO LIQD
100.0000 mg | Freq: Every day | ORAL | Status: DC | PRN
  Filled 2016-07-27: qty 10

## 2016-07-27 MED ORDER — IMPACT PO LIQD: 237.0000 mL | Freq: Three times a day (TID) | ORAL | Status: DC

## 2016-07-27 MED ORDER — AMOXICILLIN-POT CLAVULANATE 875-125 MG PO TABS: 1.0000 | ORAL_TABLET | Freq: Two times a day (BID) | ORAL | 0 refills | Status: DC

## 2016-07-27 NOTE — Evaluation (Signed)
Physical Therapy Evaluation Patient Details Name: Crystal Kent MRN: [id number] DOB: [birth year] Today's Date: 07/27/2016   History of Present Illness  Pt is a 71 y/o F s/p fall and x-rays revealed pubic ramus fx on L side.  Chest x-ray showed R sided pneumonia.  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    Clinical Impression  Pt admitted with above diagnosis. Pt currently with functional limitations due to the deficits listed below (see PT Problem List). Crystal Kent was Ind without using AD PTA.  She presents with pain with ambulation and WBing heavily through BUEs using RW, ambulating 60 ft.  She requires supervision for safety with transfers and ambulation and will have 24/7 supervision from her husband at d/c.  Anticipate that once pt's pain has resolved she will very quickly return to PLOF with no need for follow up therapy.  She was encouraged to complete therapeutic exercises as completed today and to ambulate to her tolerance at home for strength maintenance and building.      Follow Up Recommendations No PT follow up;Supervision for mobility/OOB    Equipment Recommendations  None recommended by PT    Recommendations for Other Services       Precautions / Restrictions Precautions Precautions: Fall Restrictions Weight Bearing Restrictions: No Other Position/Activity Restrictions: Spoke with Dr. Vianne Bulls prior to evaluation who reported no WB restrictions.      Mobility  Bed Mobility Overal bed mobility: Modified Independent             General bed mobility comments: Pt uses LUE to assist LLE to EOB.  No physical assist or cues needed.  Transfers Overall transfer level: Needs assistance Equipment used: Rolling walker (2 wheeled) Transfers: Sit to/from Stand Sit to Stand: Supervision         General transfer comment: Supervision for safety.  Cues to bring RW back with her when preparing to  sit.  Ambulation/Gait Ambulation/Gait assistance: Supervision Ambulation Distance (Feet): 60 Feet Assistive device: Rolling walker (2 wheeled) Gait Pattern/deviations: Step-to pattern;Step-through pattern;Decreased stride length;Decreased stance time - left;Decreased step length - right;Decreased weight shift to left;Antalgic Gait velocity: decreased Gait velocity interpretation: Below normal speed for age/gender General Gait Details: Pt WBing heavily through UEs due to pain L hip/pelvic region with step to gait pattern.  Slow but steady.  Stairs            Wheelchair Mobility    Modified Rankin (Stroke Patients Only)       Balance Overall balance assessment: Needs assistance;History of Falls Sitting-balance support: No upper extremity supported;Feet supported Sitting balance-Leahy Scale: Good     Standing balance support: No upper extremity supported;During functional activity Standing balance-Leahy Scale: Poor Standing balance comment: Pt able to stand statically without UE support but relies on RW for dynamic activities.                             Pertinent Vitals/Pain Pain Assessment: Faces Faces Pain Scale: Hurts even more Pain Location: anterior/L pelvic region Pain Descriptors / Indicators: Grimacing Pain Intervention(s): Limited activity within patient's tolerance;Monitored during session;Repositioned    Home Living Family/patient expects to be discharged to:: Private residence Living Arrangements: Spouse/significant other Available Help at Discharge: Family;Available 24 hours/day Type of Home: House Home Access: Level entry     Home Layout: One level Home Equipment: Walker - 2 wheels;Walker - 4 wheels      Prior Function Level of Independence:  Independent         Comments: Denies any additional falls in the past 6 months.  Ind without AD PTA.  Ind with all mobility and ADLs.     Hand Dominance        Extremity/Trunk Assessment    Upper Extremity Assessment Upper Extremity Assessment: LUE deficits/detail LUE Deficits / Details: scar tissue L forearm s/p face reconstruction.  Otherwise strength WNL    Lower Extremity Assessment Lower Extremity Assessment: LLE deficits/detail LLE Deficits / Details: Pt able to perform SLR but reports pain with this.  Unable to formally assess strength due to pain. LLE: Unable to fully assess due to pain       Communication   Communication: Other (comment) (difficult to understand s/p face reconstruction)  Cognition Arousal/Alertness: Awake/alert Behavior During Therapy: WFL for tasks assessed/performed Overall Cognitive Status: Within Functional Limits for tasks assessed                      General Comments      Exercises General Exercises - Lower Extremity Ankle Circles/Pumps: AROM;Both;10 reps;Supine Gluteal Sets: Strengthening;Both;10 reps;Supine Long Arc Quad: Strengthening;Both;10 reps;Seated Hip Flexion/Marching: Strengthening;Both;5 reps;Seated   Assessment/Plan    PT Assessment Patient needs continued PT services  PT Problem List Decreased strength;Decreased range of motion;Decreased activity tolerance;Decreased balance;Pain;Decreased safety awareness;Decreased knowledge of use of DME          PT Treatment Interventions DME instruction;Gait training;Functional mobility training;Therapeutic activities;Therapeutic exercise;Balance training;Neuromuscular re-education;Patient/family education;Modalities    PT Goals (Current goals can be found in the Care Plan section)  Acute Rehab PT Goals Patient Stated Goal: to go home PT Goal Formulation: With patient/family Time For Goal Achievement: 08/03/16 Potential to Achieve Goals: Good    Frequency Min 2X/week   Barriers to discharge        Co-evaluation               End of Session Equipment Utilized During Treatment: Gait belt Activity Tolerance: Patient tolerated treatment well;Patient  limited by pain Patient left: in bed;with call bell/phone within reach;with bed alarm set;with family/visitor present Nurse Communication: Mobility status         Time: [id number] PT Time Calculation (min) (ACUTE ONLY): 30 min   Charges:   PT Evaluation $PT Eval Low Complexity: 1 Procedure PT Treatments $Gait Training: 8-22 mins   PT G Codes:        Collie Siad PT, DPT 07/27/2016, 2:57 PM

## 2016-07-27 NOTE — Progress Notes (Signed)
Crystal Kent at [place] NAME: Crystal Kent    MR#:  [id number]  DATE OF BIRTH:  1946-03-23  SUBJECTIVE: Admitted because of  falland  Pneumonia. Because of pain  meds, hypotensive today.waiting for physical therapy evaluation for discharge planning.   CHIEF COMPLAINT:   Chief Complaint  Patient presents with  . Loss of Consciousness  . Hip Pain    REVIEW OF SYSTEMS:   ROS CONSTITUTIONAL: No fever, fatigue or weakness.  EYES: No blurred or double vision.  EARS, NOSE, AND THROAT: No tinnitus or ear pain.  RESPIRATORY: No cough, shortness of breath, wheezing or hemoptysis.  CARDIOVASCULAR: No chest pain, orthopnea, edema.  GASTROINTESTINAL: No nausea, vomiting, diarrhea or abdominal pain.  GENITOURINARY: No dysuria, hematuria.  ENDOCRINE: No polyuria, nocturia,  HEMATOLOGY: No anemia, easy bruising or bleeding SKIN: No rash or lesion. MUSCULOSKELETAL: lefr hip pain NEUROLOGIC: No tingling, numbness, weakness.  PSYCHIATRY: No anxiety or depression.   DRUG ALLERGIES:  No Known Allergies  VITALS:  Blood pressure (!) 87/53, pulse 81, temperature 97.4 F (36.3 C), temperature source Oral, resp. rate 18, height 5' (1.524 m), weight 45 kg (99 lb 4.8 oz), SpO2 96 %.  PHYSICAL EXAMINATION:  GENERAL:  71 y.o.-year-old patient lying in the bed with no acute distress.  EYES: Pupils equal, round, reactive to light and accommodation. No scleral icterus. Extraocular muscles intact.  HEENT: Head atraumatic, normocephalic. Oropharynx and nasopharynx clear.  NECK:  Supple, no jugular venous distention. No thyroid enlargement, no tenderness.  LUNGS: Normal breath sounds bilaterally, no wheezing, rales,rhonchi or crepitation. No use of accessory muscles of respiration.  CARDIOVASCULAR: S1, S2 normal. No murmurs, rubs, or gallops.  ABDOMEN: Soft, nontender, nondistended. Bowel sounds present. No organomegaly or mass.  EXTREMITIES: No pedal  edema, cyanosis, or clubbing.  NEUROLOGIC: Cranial nerves II through XII are intact. Muscle strength 5/5 in all extremities. Sensation intact. Gait not checked.  PSYCHIATRIC: The patient is alert and oriented x 3.  SKIN: No obvious rash, lesion, or ulcer.    LABORATORY PANEL:   CBC  Recent Labs Lab 07/27/16 0325  WBC 10.1  HGB 10.8*  HCT 31.1*  PLT 210   ------------------------------------------------------------------------------------------------------------------  Chemistries   Recent Labs Lab 07/27/16 0325  NA 134*  K 3.9  CL 101  CO2 27  GLUCOSE 114*  BUN 23*  CREATININE 0.74  CALCIUM 9.0   ------------------------------------------------------------------------------------------------------------------  Cardiac Enzymes No results for input(s): TROPONINI in the last 168 hours. ------------------------------------------------------------------------------------------------------------------  RADIOLOGY:  Dg Chest 2 View  Result Date: 07/26/2016 CLINICAL DATA:  Syncope EXAM: CHEST  2 VIEW COMPARISON:  04/16/2016 FINDINGS: There is hazy airspace disease in the right upper lobe. Right apical pleural thickening is stable. No pneumothorax. Normal heart size. No pleural effusion. IMPRESSION: There is airspace disease in the right upper lobe. Given the history of syncope, pulmonary contusion is not excluded. Findings may represent underlying pneumonia. Followup PA and lateral chest X-ray is recommended in 3-4 weeks following trial of antibiotic therapy to ensure resolution and exclude underlying malignancy. Electronically Signed   By: Marybelle Killings M.D.   On: 07/26/2016 16:27   Dg Hip Unilat With Pelvis 2-3 Views Left  Result Date: 07/26/2016 CLINICAL DATA:  Fall. EXAM: DG HIP (WITH OR WITHOUT PELVIS) 2-3V LEFT COMPARISON:  No recent prior . FINDINGS: Displaced fractures of the left superior inferior pubic rami noted. No other focal abnormality identified. Diffuse osteopenia  degenerative change. Peripheral vascular calcification. IMPRESSION: 1. Displaced  fractures of the left superior and inferior pubic rami. 2. Peripheral vascular disease. Electronically Signed   By: Marcello Moores  Register   On: 07/26/2016 13:55    EKG:   Orders placed or performed during the hospital encounter of 07/26/16  . EKG 12-Lead  . EKG 12-Lead    ASSESSMENT AND PLAN:   Left superior, inferior pubic ramus fractures; displaced; continue pain medicines, physical therapy consult.  #2 community-acquired pneumonia: Right-sided pneumonia on Rocephin, Zithromax, likely discharge tomorrow with Augmentin, Zithromax.  #3 hypotension secondary to narcotics: Patient Norvasc is held this morning because of hypotension. #4 .history of oral cancer status post surgery,  has PEG tube feedings 'continue them. D/w husband.  All the records are reviewed and case discussed with Care Management/Social Workerr. Management plans discussed with the patient, family and they are in agreement.  CODE STATUS:full  TOTAL TIME TAKING CARE OF THIS PATIENT: 35 minutes.   POSSIBLE D/C IN 1- DAY DEPENDING ON CLINICAL CONDITION.   Epifanio Kent M.D on 07/27/2016 at 8:30 AM  Between 7am to 6pm - Pager - [phone]  After 6pm go to [email or web address] - password [email or web address] [facility]  Office  [phone]  CC: Primary care physician; Adline Potter, MD   Note: This dictation was prepared with Dragon dictation along with smaller phrase technology. Any transcriptional errors that result from this process are unintentional.

## 2016-07-27 NOTE — Progress Notes (Signed)
Pt refused jevity. She said she wasn't sure it would work for her. She used boost she brought from home. Her husband administered it. Pt is alert and oriented. She sat on side of be to consume her coffee and water. Her husband assisted her. Pt ambulated to bathroom without difficulty. Pt in to see pt. States pt has no further physical therapy needs and is ready to be discharged. Dr Vianne Bulls was notified and gave order to discharge pt.

## 2016-07-30 NOTE — Discharge Summary (Signed)
Crystal Kent, is a 71 y.o. female  DOB 04/19/1946  MRN [id number].  Admission date:  07/26/2016  Admitting Physician  [name], MD  Discharge Date:  07/27/2016   Primary MD  Adline Potter, MD  Recommendations for primary care physician for things to follow:   Follow up with pmd  In one week   Admission Diagnosis  Closed displaced fracture of left ischium, unspecified fracture morphology, initial encounter (Crystal Kent) [S32.602A]   Discharge Diagnosis  Closed displaced fracture of left ischium, unspecified fracture morphology, initial encounter (Crystal Kent) [S32.602A]    Active Problems:   Pneumonia      Past Medical History:  Diagnosis Date  . Acid reflux   . 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 Hypertension   . Thyroid disease     Past Surgical History:  Procedure Laterality Date  . ABDOMINAL HYSTERECTOMY    . BREAST BIOPSY Left    bx/clip-neg  . MOUTH SURGERY     surgery after cancer       History of present illness and  Hospital Course:     Kindly see H&P for history of present illness and admission details, please review complete Labs, Consult reports and Test reports for all details in brief  HPI  from the history and physical done on the day of admission 71 year old female patient with history of oral cancer status post PEG feedings arms in because of balance problems, fall found to have left pubic  Ramus  fracture, admitted for the same.   Hospital Course   #45. 71 year old female patient with fall, with left pubic ramus fracture; And admitted to hospitalist service, denuded on pain medicines, patient seen by physical therapy, patient felt better next day with respect to pain, physical therapy did not recommend any home physical therapy, discharge home with the pain medicines #2   community-acquired pneumonia: Improved with IV Rocephin, Zithromax discharged home with augmentin and azithromycin. 3. Cardizem continue Synthyroid. History of oral cancer: Patient is on PEG tube feeding. Continue that.   Discharge Condition:stable   Follow UP      Discharge Instructions  and  Discharge Medications      Allergies as of 07/27/2016   No Known Allergies     Medication List    STOP taking these medications   amLODipine 5 MG tablet Commonly known as:  NORVASC     TAKE these medications   amoxicillin-clavulanate 875-125 MG tablet Commonly known as:  AUGMENTIN Place 1 tablet into feeding tube 2 (two) times daily.   azithromycin 500 MG tablet Commonly known as:  ZITHROMAX Place 1 tablet (500 mg total) into feeding tube daily.   fluticasone 50 MCG/ACT nasal spray Commonly known as:  FLONASE 2 sprays per nostril every morning   HYDROcodone-acetaminophen 5-325 MG tablet Commonly known as:  NORCO/VICODIN Place 1-2 tablets into feeding tube every 4 (four) hours as needed for moderate pain.   levothyroxine 75 MCG tablet Commonly known as:  SYNTHROID, LEVOTHROID TAKE ONE TABLET BY MOUTH EVERY DAY   nystatin cream Commonly known as:  MYCOSTATIN once daily as needed.   omeprazole 20 MG capsule Commonly known as:  PRILOSEC TAKE ONE CAPSULE BY MOUTH EVERY DAY         Diet and Activity recommendation: See Discharge Instructions above   Consults obtained -none   Major procedures and Radiology Reports - PLEASE review detailed and final reports for all details, in brief -      Dg Chest  2 View  Result Date: 07/26/2016 CLINICAL DATA:  Syncope EXAM: CHEST  2 VIEW COMPARISON:  04/16/2016 FINDINGS: There is hazy airspace disease in the right upper lobe. Right apical pleural thickening is stable. No pneumothorax. Normal heart size. No pleural effusion. IMPRESSION: There is airspace disease in the right upper lobe. Given the history of syncope, pulmonary  contusion is not excluded. Findings may represent underlying pneumonia. Followup PA and lateral chest X-ray is recommended in 3-4 weeks following trial of antibiotic therapy to ensure resolution and exclude underlying malignancy. Electronically Signed   By: Marybelle Killings M.D.   On: 07/26/2016 16:27   Dg Hip Unilat With Pelvis 2-3 Views Left  Result Date: 07/26/2016 CLINICAL DATA:  Fall. EXAM: DG HIP (WITH OR WITHOUT PELVIS) 2-3V LEFT COMPARISON:  No recent prior . FINDINGS: Displaced fractures of the left superior inferior pubic rami noted. No other focal abnormality identified. Diffuse osteopenia degenerative change. Peripheral vascular calcification. IMPRESSION: 1. Displaced fractures of the left superior and inferior pubic rami. 2. Peripheral vascular disease. Electronically Signed   By: Marcello Moores  Register   On: 07/26/2016 13:55    Micro Results    No results found for this or any previous visit (from the past 240 hour(s)).     Today   Subjective:   [name] today has no headache,no chest abdominal pain,no new weakness tingling or numbness, feels much better wants to go home today.  Objective:   Blood pressure (!) 87/53, pulse 97, temperature 98.1 F (36.7 C), temperature source Axillary, resp. rate 18, height 5' (1.524 m), weight 45 kg (99 lb 4.8 oz), SpO2 90 %.  No intake or output data in the 24 hours ending 07/30/16 1714  Exam Awake Alert, Oriented x 3, No new F.N deficits, Normal affect [place].AT,PERRAL Supple Neck,No JVD, No cervical lymphadenopathy appriciated.  Symmetrical Chest wall movement, Good air movement bilaterally, CTAB RRR,No Gallops,Rubs or new Murmurs, No Parasternal Heave +ve B.Sounds, Abd Soft, Non tender, No organomegaly appriciated, No rebound -guarding or rigidity. No Cyanosis, Clubbing or edema, No new Rash or bruise  Data Review   CBC w Diff:  Lab Results  Component Value Date   WBC 10.1 07/27/2016   HGB 10.8 (L) 07/27/2016   HCT 31.1 (L)  07/27/2016   HCT 35.1 06/28/2016   PLT 210 07/27/2016   PLT 194 06/28/2016    CMP:  Lab Results  Component Value Date   NA 134 (L) 07/27/2016   NA 136 06/28/2016   K 3.9 07/27/2016   CL 101 07/27/2016   CO2 27 07/27/2016   BUN 23 (H) 07/27/2016   BUN 18 06/28/2016   CREATININE 0.74 07/27/2016   PROT 7.1 06/28/2016   ALBUMIN 4.8 06/28/2016   BILITOT 0.6 06/28/2016   ALKPHOS 118 (H) 06/28/2016   AST 36 06/28/2016   ALT 27 06/28/2016  .   Total Time in preparing paper work, data evaluation and todays exam - 11 minutes  Mazella Deen M.D on 07/27/2016 at 5:14 PM    Note: This dictation was prepared with Dragon dictation along with smaller phrase technology. Any transcriptional errors that result from this process are unintentional.

## 2016-08-02 ENCOUNTER — Telehealth: Payer: Self-pay

## 2016-08-02 ENCOUNTER — Other Ambulatory Visit: Payer: Self-pay | Admitting: Family Medicine

## 2016-08-02 MED ORDER — HYDROCODONE-ACETAMINOPHEN 5-325 MG PO TABS: 1.0000 | ORAL_TABLET | ORAL | 0 refills | Status: DC | PRN

## 2016-08-02 NOTE — Telephone Encounter (Signed)
Husband came by to get refill on pain meds that hospital wrote when patient was there for broken hip.  Called and spoke to patient who states she does her own PT and they never said she needed to see  PT but did give her some things to do at home.   Instructed to make appt when they pick up Rx and with in 5 days so we can discuss PT and pain. No More Refills until seen. Husband to pick up Rx.

## 2016-08-12 ENCOUNTER — Encounter: Payer: Self-pay | Admitting: Family Medicine

## 2016-08-12 ENCOUNTER — Ambulatory Visit (INDEPENDENT_AMBULATORY_CARE_PROVIDER_SITE_OTHER): Payer: Medicare HMO | Admitting: Family Medicine

## 2016-08-12 VITALS — BP 112/68 | HR 82 | Temp 97.6°F | Ht 59.0 in | Wt 101.0 lb

## 2016-08-12 DIAGNOSIS — E039 Hypothyroidism, unspecified: Secondary | ICD-10-CM

## 2016-08-12 DIAGNOSIS — I10 Essential (primary) hypertension: Secondary | ICD-10-CM

## 2016-08-12 DIAGNOSIS — J181 Lobar pneumonia, unspecified organism: Secondary | ICD-10-CM

## 2016-08-12 DIAGNOSIS — S32602D Unspecified fracture of left ischium, subsequent encounter for fracture with routine healing: Secondary | ICD-10-CM | POA: Diagnosis not present

## 2016-08-12 DIAGNOSIS — J189 Pneumonia, unspecified organism: Secondary | ICD-10-CM

## 2016-08-12 MED ORDER — TRAMADOL HCL 50 MG PO TABS: 50.0000 mg | ORAL_TABLET | Freq: Four times a day (QID) | ORAL | 0 refills | Status: DC | PRN

## 2016-08-12 MED ORDER — AMLODIPINE BESYLATE 5 MG PO TABS: 5.0000 mg | ORAL_TABLET | Freq: Every day | ORAL | 3 refills | Status: DC

## 2016-08-13 NOTE — Progress Notes (Signed)
Date:  08/12/2016   Name:  Crystal Kent   DOB:  August 25, 1945   MRN:  [id number]  PCP:  Adline Potter, MD    Chief Complaint: Hip Injury (pt stated injured left hip bone)   History of Present Illness:  This is a 71 y.o. female seen in 6 week f/u. Fell two weeks ago sustaining L ischium fx, kept in ER overnight. Pain improved on Vicodin but only has 1-2 days left. Also dx'd with CAP, abx course complete, cough improved. No other new concerns.  Review of Systems:  Review of Systems  Constitutional: Negative for chills and fever.  Respiratory: Negative for shortness of breath and wheezing.   Cardiovascular: Negative for chest pain and leg swelling.  Neurological: Negative for syncope and light-headedness.  Psychiatric/Behavioral: Negative for agitation and confusion.    Patient Active Problem List   Diagnosis Date Noted  . Pneumonia 07/26/2016  . 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 Hypertension 06/28/2016  . Allergic rhinitis 06/28/2016  . Current use of proton pump inhibitor 06/28/2016  . 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 Chronic pansinusitis 02/13/2016  . Eustachian tube dysfunction 12/14/2014  . Hearing loss 12/14/2014  . Lesion of buccal mucosa 12/14/2014  . Dysphagia, oropharyngeal 05/23/2014  . Hypothyroidism 04/27/2014  . 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 Authorizing Provider  fluticasone (FLONASE) 50 MCG/ACT nasal spray 2 sprays per nostril every morning 04/27/13  Yes Historical Provider, MD  levothyroxine (SYNTHROID, LEVOTHROID) 75 MCG tablet TAKE ONE TABLET BY MOUTH EVERY DAY 06/25/12  Yes Historical Provider, MD  nystatin cream (MYCOSTATIN) once daily as needed.  05/02/16  Yes Historical Provider, MD  omeprazole (PRILOSEC) 20 MG capsule TAKE ONE  CAPSULE BY MOUTH EVERY DAY 10/25/11  Yes Historical Provider, MD  amLODipine (NORVASC) 5 MG tablet Take 1 tablet (5 mg total) by mouth daily. 08/12/16   Adline Potter, MD  traMADol (ULTRAM) 50 MG tablet Take 1 tablet (50 mg total) by mouth every 6 (six) hours as needed for moderate pain. 08/12/16   Adline Potter, MD    No Known Allergies  Past Surgical History:  Procedure Laterality Date  . ABDOMINAL HYSTERECTOMY    . BREAST BIOPSY Left    bx/clip-neg  . MOUTH SURGERY     surgery after cancer    Social History  Substance Use Topics  . Smoking status: Never Smoker  . Smokeless tobacco: Never Used  . Alcohol use No    Family History  Problem Relation Age of Onset  . Alzheimer's disease Mother   . Heart attack Father     Medication list has been reviewed and updated.  Physical Examination: BP 112/68   Pulse 82   Temp 97.6 F (36.4 C)   Ht 4\' 11"  (1.499 m)   Wt 101 lb (45.8 kg)   SpO2 97%   BMI 20.40 kg/m   Physical Exam  Constitutional: She is oriented to person, place, and time. She appears well-developed and well-nourished.  Cardiovascular: Normal rate, regular rhythm and normal heart sounds.   Pulmonary/Chest: Effort normal and breath sounds normal.  Musculoskeletal: She exhibits no edema.  Neurological: She is alert and oriented to person, place, and time.  Skin: Skin is warm and dry.  Psychiatric: She has a normal mood and affect. Her behavior is normal.  Nursing note and vitals reviewed.   Assessment and Plan:  1. Closed nondisplaced fracture of left ischium with routine healing, unspecified fracture morphology, subsequent encounter Healing well, change Vicodin to tramadol prn #30 only  2. Community acquired pneumonia of right upper lobe of lung ([place]) Improved s/p abxs  3. Essential hypertension Well controlled on amlodipine  4. Hypothyroidism, unspecified type Well controlled on Synthroid, TSH ok in November  Return in about 5 months (around  01/10/2017).  [name]. [facility]  08/13/2016

## 2016-08-19 ENCOUNTER — Inpatient Hospital Stay: Payer: Self-pay | Admitting: Family Medicine

## 2016-09-10 ENCOUNTER — Encounter: Payer: Self-pay | Admitting: Family Medicine

## 2016-09-10 ENCOUNTER — Ambulatory Visit (INDEPENDENT_AMBULATORY_CARE_PROVIDER_SITE_OTHER): Payer: Medicare HMO | Admitting: Family Medicine

## 2016-09-10 VITALS — BP 166/87 | HR 87 | Resp 19 | Ht 59.0 in | Wt 101.8 lb

## 2016-09-10 DIAGNOSIS — M5431 Sciatica, right side: Secondary | ICD-10-CM | POA: Diagnosis not present

## 2016-09-10 DIAGNOSIS — Z931 Gastrostomy status: Secondary | ICD-10-CM

## 2016-09-10 DIAGNOSIS — B369 Superficial mycosis, unspecified: Secondary | ICD-10-CM

## 2016-09-10 DIAGNOSIS — I10 Essential (primary) hypertension: Secondary | ICD-10-CM | POA: Diagnosis not present

## 2016-09-10 MED ORDER — NAPROXEN 500 MG PO TABS: 500.0000 mg | ORAL_TABLET | Freq: Two times a day (BID) | ORAL | 0 refills | Status: DC

## 2016-09-10 NOTE — Progress Notes (Signed)
Date:  09/10/2016   Name:  Crystal Kent   DOB:  10/26/1945   MRN:  [id number]  PCP:  Adline Potter, MD    Chief Complaint: Rectal Pain (Had swelling over Right buttock area since last Wed. Getting better now but concerned it has something to do with pekvis injury prior. )   History of Present Illness:  This is a 71 y.o. female with one week hx R sided low back and hip pain, no known injury, started after sat on hard chai, radiates to posterior thigh, getting better with ibuprofen. L sided pain from ischium fx last month has resolved. Pain worse with standing. Also c/o rash around PEG tube site, chronic, has nystatin and HC cream at home. Did not take amlodipine today.  Review of Systems:  Review of Systems  Constitutional: Negative for chills and fever.  Respiratory: Negative for cough and shortness of breath.   Cardiovascular: Negative for chest pain and leg swelling.  Gastrointestinal: Negative for abdominal pain.  Neurological: Negative for syncope and light-headedness.    Patient Active Problem List   Diagnosis Date Noted  . Pneumonia 07/26/2016  . 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 Hypertension 06/28/2016  . Allergic rhinitis 06/28/2016  . Current use of proton pump inhibitor 06/28/2016  . 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 Chronic pansinusitis 02/13/2016  . Eustachian tube dysfunction 12/14/2014  . Hearing loss 12/14/2014  . Lesion of buccal mucosa 12/14/2014  . Dysphagia, oropharyngeal 05/23/2014  . Hypothyroidism 04/27/2014  . 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 Authorizing Provider  amLODipine (NORVASC) 5 MG tablet Take 1 tablet (5 mg total) by mouth daily. 08/12/16  Yes Adline Potter, MD  fluticasone Asencion Islam) 50 MCG/ACT nasal spray 2 sprays per  nostril every morning 04/27/13  Yes Historical Provider, MD  levothyroxine (SYNTHROID, LEVOTHROID) 75 MCG tablet TAKE ONE TABLET BY MOUTH EVERY DAY 06/25/12  Yes Historical Provider, MD  nystatin cream (MYCOSTATIN) once daily as needed.  05/02/16  Yes Historical Provider, MD  omeprazole (PRILOSEC) 20 MG capsule TAKE ONE CAPSULE BY MOUTH EVERY DAY 10/25/11  Yes Historical Provider, MD  naproxen (NAPROSYN) 500 MG tablet Take 1 tablet (500 mg total) by mouth 2 (two) times daily with a meal. 09/10/16   Adline Potter, MD    No Known Allergies  Past Surgical History:  Procedure Laterality Date  . ABDOMINAL HYSTERECTOMY    . BREAST BIOPSY Left    bx/clip-neg  . MOUTH SURGERY     surgery after cancer    Social History  Substance Use Topics  . Smoking status: Never Smoker  . Smokeless tobacco: Never Used  . Alcohol use No    Family History  Problem Relation Age of Onset  . Alzheimer's disease Mother   . Heart attack Father     Medication list has been reviewed and updated.  Physical Examination: BP (!) 166/87   Pulse 87   Resp 19   Ht 4\' 11"  (1.499 m)   Wt 101 lb 12.8 oz (46.2 kg)   SpO2 97%   BMI 20.56 kg/m   Physical Exam  Constitutional: She appears well-developed and well-nourished.  Cardiovascular: Normal rate, regular rhythm and normal heart sounds.   Pulmonary/Chest: Effort normal and breath sounds normal.  Musculoskeletal: She exhibits no edema.  Positive SLR  on R, no pain with bent knee flexion or hip rotation  Neurological: She is alert.  Skin: Skin is warm and dry.  Moist erythema around gastrostomy tube site, no edema or warmth noted  Psychiatric: She has a normal mood and affect. Her behavior is normal.  Nursing note and vitals reviewed.   Assessment and Plan:  1. Sciatica of right side Naprosyn 500 mg bid x 7d only, consider XR/PT if pain persists  2. Essential hypertension Poor control today due to missed amlodipine dose/pain/ibuprofen  3. Fungal  dermatitis Nystatin prn, call if sxs worsen/persist  4. Gastrostomy in place [facility])  Return if symptoms worsen or fail to improve.  [name]. [facility]  09/10/2016

## 2016-09-20 ENCOUNTER — Telehealth: Payer: Self-pay

## 2016-09-20 NOTE — Telephone Encounter (Signed)
Husband called for referral to [facility] for issues with Feeding Tube.  Dr. Concha Pyo [phone]. Will call for fax number if ref is approved.

## 2016-09-23 ENCOUNTER — Other Ambulatory Visit: Payer: Self-pay | Admitting: Family Medicine

## 2016-09-23 ENCOUNTER — Other Ambulatory Visit: Payer: Self-pay

## 2016-09-23 DIAGNOSIS — Z931 Gastrostomy status: Secondary | ICD-10-CM

## 2016-09-23 NOTE — Telephone Encounter (Signed)
Referral OK

## 2016-09-23 NOTE — Telephone Encounter (Signed)
Patient lost Son and Yolanda Bonine Sunday night. Grandson 72 yrs old they both were [place]. Investigation pending. I gave sympathy to patient and sent card from [place].

## 2016-10-09 ENCOUNTER — Telehealth: Payer: Self-pay

## 2016-10-09 MED ORDER — LEVOTHYROXINE SODIUM 75 MCG PO TABS: 75.0000 ug | ORAL_TABLET | Freq: Every day | ORAL | 3 refills | Status: DC

## 2016-10-09 MED ORDER — OMEPRAZOLE 20 MG PO CPDR: 20.0000 mg | DELAYED_RELEASE_CAPSULE | Freq: Every day | ORAL | 3 refills | Status: DC

## 2016-10-09 MED ORDER — AMLODIPINE BESYLATE 5 MG PO TABS: 5.0000 mg | ORAL_TABLET | Freq: Every day | ORAL | 3 refills | Status: DC

## 2016-10-14 NOTE — Telephone Encounter (Signed)
error 

## 2016-10-16 DIAGNOSIS — C01 Malignant neoplasm of base of tongue: Secondary | ICD-10-CM | POA: Diagnosis not present

## 2016-10-16 DIAGNOSIS — E039 Hypothyroidism, unspecified: Secondary | ICD-10-CM | POA: Diagnosis not present

## 2016-10-16 DIAGNOSIS — C050 Malignant neoplasm of hard palate: Secondary | ICD-10-CM | POA: Diagnosis not present

## 2016-10-16 DIAGNOSIS — C069 Malignant neoplasm of mouth, unspecified: Secondary | ICD-10-CM | POA: Diagnosis not present

## 2016-10-16 DIAGNOSIS — H6980 Other specified disorders of Eustachian tube, unspecified ear: Secondary | ICD-10-CM | POA: Diagnosis not present

## 2016-10-16 DIAGNOSIS — B379 Candidiasis, unspecified: Secondary | ICD-10-CM | POA: Diagnosis not present

## 2016-10-16 DIAGNOSIS — H6591 Unspecified nonsuppurative otitis media, right ear: Secondary | ICD-10-CM | POA: Diagnosis not present

## 2016-10-22 ENCOUNTER — Other Ambulatory Visit: Payer: Self-pay | Admitting: Family Medicine

## 2016-10-22 MED ORDER — AMLODIPINE BESYLATE 5 MG PO TABS: 5.0000 mg | ORAL_TABLET | Freq: Every day | ORAL | 3 refills | Status: DC

## 2016-11-04 ENCOUNTER — Other Ambulatory Visit: Payer: Self-pay

## 2016-11-04 ENCOUNTER — Telehealth: Payer: Self-pay | Admitting: Family Medicine

## 2016-11-04 MED ORDER — AMLODIPINE BESYLATE 5 MG PO TABS: 5.0000 mg | ORAL_TABLET | Freq: Every day | ORAL | 0 refills | Status: DC

## 2016-11-04 MED ORDER — AMLODIPINE BESYLATE 5 MG PO TABS: 5.0000 mg | ORAL_TABLET | Freq: Every day | ORAL | 3 refills | Status: DC

## 2016-11-04 NOTE — Telephone Encounter (Signed)
Patient's husband states that the patient still hasn't gotten her prescription for Amlodipine to the [facility]. Also, she is out of her medication,so they were wondering if she could get something for 30 days called into the [facility] on [address]. Please advise.

## 2016-12-04 ENCOUNTER — Telehealth: Payer: Self-pay

## 2016-12-04 NOTE — Telephone Encounter (Signed)
Crystal Kent from [facility] called stating that a paper was sent to [place] asking if we were planning to order Dexa on this patient as it is recommended. I have not seen this form but is this patient going to need Dexa?

## 2016-12-04 NOTE — Telephone Encounter (Signed)
No DEXA planned, already has dx osteoporosis

## 2016-12-09 ENCOUNTER — Ambulatory Visit (INDEPENDENT_AMBULATORY_CARE_PROVIDER_SITE_OTHER): Payer: Medicare HMO

## 2016-12-09 VITALS — BP 100/62 | HR 70 | Temp 97.8°F | Ht 59.0 in | Wt 100.8 lb

## 2016-12-09 DIAGNOSIS — Z0000 Encounter for general adult medical examination without abnormal findings: Secondary | ICD-10-CM | POA: Diagnosis not present

## 2016-12-09 NOTE — Patient Instructions (Addendum)
[name] , Thank you for taking time to come for your Medicare Wellness Visit. I appreciate your ongoing commitment to your health goals. Please review the following plan we discussed and let me know if I can assist you in the future.   Screening recommendations/referrals: Colonoscopy: Completed 07/23/2011 Mammogram: Completed 02/09/2015, due 01/2017 Bone Density: Completed- diagnosis of Osteoporosis in 2012 Recommended yearly ophthalmology/optometry visit for glaucoma screening and checkup Recommended yearly dental visit for hygiene and checkup  Vaccinations: Influenza vaccine: up to date, due 04/2017 Pneumococcal vaccine: completed series Tdap vaccine: Patient reported as received Shingles vaccine: Patient reported as recieved  Advanced directives: Advance directive discussed with you today. I have provided a copy for you to complete at home and have notarized. Once this is complete please bring a copy in to our office so we can scan it into your chart.  Conditions/risks identified: Fall preventions discussed, recommend good lighting, no area rugs, avoiding stairs.  Next appointment: Follow up with Dr.Plonk on 12/27/2016 at 10:00am. Follow up in one year for your annual wellness exam   Preventive Care 65 Years and Older, Female Preventive care refers to lifestyle choices and visits with your health care provider that can promote health and wellness. What does preventive care include?  A yearly physical exam. This is also called an annual well check.  Dental exams once or twice a year.  Routine eye exams. Ask your health care provider how often you should have your eyes checked.  Personal lifestyle choices, including:  Daily care of your teeth and gums.  Regular physical activity.  Eating a healthy diet.  Avoiding tobacco and drug use.  Limiting alcohol use.  Practicing safe sex.  Taking low-dose aspirin every day.  Taking vitamin and mineral supplements as  recommended by your health care provider. What happens during an annual well check? The services and screenings done by your health care provider during your annual well check will depend on your age, overall health, lifestyle risk factors, and family history of disease. Counseling  Your health care provider may ask you questions about your:  Alcohol use.  Tobacco use.  Drug use.  Emotional well-being.  Home and relationship well-being.  Sexual activity.  Eating habits.  History of falls.  Memory and ability to understand (cognition).  Work and work Statistician.  Reproductive health. Screening  You may have the following tests or measurements:  Height, weight, and BMI.  Blood pressure.  Lipid and cholesterol levels. These may be checked every 5 years, or more frequently if you are over 52 years old.  Skin check.  Lung cancer screening. You may have this screening every year starting at age 75 if you have a 30-pack-year history of smoking and currently smoke or have quit within the past 15 years.  Fecal occult blood test (FOBT) of the stool. You may have this test every year starting at age 22.  Flexible sigmoidoscopy or colonoscopy. You may have a sigmoidoscopy every 5 years or a colonoscopy every 10 years starting at age 41.  Hepatitis C blood test.  Hepatitis B blood test.  Sexually transmitted disease (STD) testing.  Diabetes screening. This is done by checking your blood sugar (glucose) after you have not eaten for a while (fasting). You may have this done every 1-3 years.  Bone density scan. This is done to screen for osteoporosis. You may have this done starting at age 60.  Mammogram. This may be done every 1-2 years. Talk to your health care  provider about how often you should have regular mammograms. Talk with your health care provider about your test results, treatment options, and if necessary, the need for more tests. Vaccines  Your health care  provider may recommend certain vaccines, such as:  Influenza vaccine. This is recommended every year.  Tetanus, diphtheria, and acellular pertussis (Tdap, Td) vaccine. You may need a Td booster every 10 years.  Zoster vaccine. You may need this after age 25.  Pneumococcal 13-valent conjugate (PCV13) vaccine. One dose is recommended after age 60.  Pneumococcal polysaccharide (PPSV23) vaccine. One dose is recommended after age 17. Talk to your health care provider about which screenings and vaccines you need and how often you need them. This information is not intended to replace advice given to you by your health care provider. Make sure you discuss any questions you have with your health care provider. Document Released: 08/04/2015 Document Revised: 03/27/2016 Document Reviewed: 05/09/2015 Elsevier Interactive Patient Education  2017 [employer] Prevention in the Home Falls can cause injuries. They can happen to people of all ages. There are many things you can do to make your home safe and to help prevent falls. What can I do on the outside of my home?  Regularly fix the edges of walkways and driveways and fix any cracks.  Remove anything that might make you trip as you walk through a door, such as a raised step or threshold.  Trim any bushes or trees on the path to your home.  Use bright outdoor lighting.  Clear any walking paths of anything that might make someone trip, such as rocks or tools.  Regularly check to see if handrails are loose or broken. Make sure that both sides of any steps have handrails.  Any raised decks and porches should have guardrails on the edges.  Have any leaves, snow, or ice cleared regularly.  Use sand or salt on walking paths during winter.  Clean up any spills in your garage right away. This includes oil or grease spills. What can I do in the bathroom?  Use night lights.  Install grab bars by the toilet and in the tub and shower. Do  not use towel bars as grab bars.  Use non-skid mats or decals in the tub or shower.  If you need to sit down in the shower, use a plastic, non-slip stool.  Keep the floor dry. Clean up any water that spills on the floor as soon as it happens.  Remove soap buildup in the tub or shower regularly.  Attach bath mats securely with double-sided non-slip rug tape.  Do not have throw rugs and other things on the floor that can make you trip. What can I do in the bedroom?  Use night lights.  Make sure that you have a light by your bed that is easy to reach.  Do not use any sheets or blankets that are too big for your bed. They should not hang down onto the floor.  Have a firm chair that has side arms. You can use this for support while you get dressed.  Do not have throw rugs and other things on the floor that can make you trip. What can I do in the kitchen?  Clean up any spills right away.  Avoid walking on wet floors.  Keep items that you use a lot in easy-to-reach places.  If you need to reach something above you, use a strong step stool that has a grab bar.  Keep electrical cords out of the way.  Do not use floor polish or wax that makes floors slippery. If you must use wax, use non-skid floor wax.  Do not have throw rugs and other things on the floor that can make you trip. What can I do with my stairs?  Do not leave any items on the stairs.  Make sure that there are handrails on both sides of the stairs and use them. Fix handrails that are broken or loose. Make sure that handrails are as long as the stairways.  Check any carpeting to make sure that it is firmly attached to the stairs. Fix any carpet that is loose or worn.  Avoid having throw rugs at the top or bottom of the stairs. If you do have throw rugs, attach them to the floor with carpet tape.  Make sure that you have a light switch at the top of the stairs and the bottom of the stairs. If you do not have them,  ask someone to add them for you. What else can I do to help prevent falls?  Wear shoes that:  Do not have high heels.  Have rubber bottoms.  Are comfortable and fit you well.  Are closed at the toe. Do not wear sandals.  If you use a stepladder:  Make sure that it is fully opened. Do not climb a closed stepladder.  Make sure that both sides of the stepladder are locked into place.  Ask someone to hold it for you, if possible.  Clearly mark and make sure that you can see:  Any grab bars or handrails.  First and last steps.  Where the edge of each step is.  Use tools that help you move around (mobility aids) if they are needed. These include:  Canes.  Walkers.  Scooters.  Crutches.  Turn on the lights when you go into a dark area. Replace any light bulbs as soon as they burn out.  Set up your furniture so you have a clear path. Avoid moving your furniture around.  If any of your floors are uneven, fix them.  If there are any pets around you, be aware of where they are.  Review your medicines with your doctor. Some medicines can make you feel dizzy. This can increase your chance of falling. Ask your doctor what other things that you can do to help prevent falls. This information is not intended to replace advice given to you by your health care provider. Make sure you discuss any questions you have with your health care provider. Document Released: 05/04/2009 Document Revised: 12/14/2015 Document Reviewed: 08/12/2014 Elsevier Interactive Patient Education  2017 [employer].

## 2016-12-09 NOTE — Progress Notes (Addendum)
Subjective:   Crystal Kent is a 71 y.o. female who presents for an Initial Medicare Annual Wellness Visit.  Review of Systems    N/A  Cardiac Risk Factors include: advanced age (>34men, >6 women);hypertension     Objective:    Today's Vitals   12/09/16 0900  BP: 100/62  Pulse: 70  Temp: 97.8 F (36.6 C)  Weight: 100 lb 12.8 oz (45.7 kg)  Height: 4\' 11"  (1.499 m)   Body mass index is 20.36 kg/m.   Current Medications (verified) Outpatient Encounter Prescriptions as of 12/09/2016  Medication Sig  . amLODipine (NORVASC) 5 MG tablet Take 1 tablet (5 mg total) by mouth daily.  Marland Kitchen levothyroxine (SYNTHROID, LEVOTHROID) 75 MCG tablet Take 1 tablet (75 mcg total) by mouth daily.  Marland Kitchen nystatin cream (MYCOSTATIN) once daily as needed.   Marland Kitchen omeprazole (PRILOSEC) 20 MG capsule Take 1 capsule (20 mg total) by mouth daily.  . fluticasone (FLONASE) 50 MCG/ACT nasal spray 2 sprays per nostril every morning  . naproxen (NAPROSYN) 500 MG tablet Take 1 tablet (500 mg total) by mouth 2 (two) times daily with a meal. (Patient not taking: Reported on 12/09/2016)   No facility-administered encounter medications on file as of 12/09/2016.     Allergies (verified) Patient has no known allergies.   History: Past Medical History:  Diagnosis Date  . Acid reflux   . 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 Hypertension   . Thyroid disease    Past Surgical History:  Procedure Laterality Date  . ABDOMINAL HYSTERECTOMY    . BREAST BIOPSY Left    bx/clip-neg  . MOUTH SURGERY     surgery after cancer   Family History  Problem Relation Age of Onset  . Alzheimer's disease Mother   . Heart attack Father    Social History   Occupational History  . Not on file.   Social History Main Topics  . Smoking status: Never Smoker  . Smokeless tobacco: Never Used  . Alcohol use No  . Drug use: No  . Sexual activity: Not on file    Tobacco Counseling Counseling given: Not  Answered   Activities of Daily Living In your present state of health, do you have any difficulty performing the following activities: 12/09/2016 07/27/2016  Hearing? N N  Vision? N N  Difficulty concentrating or making decisions? N N  Walking or climbing stairs? N Y  Dressing or bathing? N Y  Doing errands, shopping? N Y  Conservation officer, nature and eating ? N -  Using the Toilet? N -  In the past six months, have you accidently leaked urine? N -  Do you have problems with loss of bowel control? N -  Managing your Medications? N -  Managing your Finances? N -  Housekeeping or managing your Housekeeping? N -  Some recent data might be hidden    Immunizations and Health Maintenance Immunization History  Administered Date(s) Administered  . Influenza-Unspecified 04/21/2014  . Pneumococcal Conjugate-13 07/22/2008  . Pneumococcal Polysaccharide-23 07/23/2011   There are no preventive care reminders to display for this patient.  Patient Care Team: Adline Potter, MD as PCP - General (Family Medicine)  Indicate any recent Medical Services you may have received from other than [facility] providers in the past year (date may be approximate).     Assessment:   This is a routine wellness examination for Crystal Kent.   Hearing/Vision screen Vision Screening Comments: Goes to eye Dr. Janeann Merl  Dietary issues and exercise activities discussed: Current Exercise Habits: The patient does not participate in regular exercise at present  Goals    . Prevent Falls          Fall preventions discussed, recommend good lighting, no area rugs, avoiding stairs.      Depression Screen PHQ 2/9 Scores 12/09/2016 12/09/2016 06/28/2016  PHQ - 2 Score 0 0 0  PHQ- 9 Score 0 - -    Fall Risk Fall Risk  12/09/2016 06/28/2016  Falls in the past year? Yes No  Number falls in past yr: 1 -  Injury with Fall? Yes -  Risk Factor Category  High Fall Risk -  Follow up Falls prevention discussed -    Cognitive  Function:     6CIT Screen 12/09/2016  What Year? 0 points  What month? 0 points  What time? 0 points  Count back from 20 0 points  Months in reverse 0 points  Repeat phrase 0 points  Total Score 0    Screening Tests Health Maintenance  Topic Date Due  . DEXA SCAN  12/09/2017 (Originally 02/01/2010)  . TETANUS/TDAP  12/09/2017 (Originally 02/02/1964)  . Hepatitis C Screening  12/09/2017 (Originally 09-Apr-1946)  . MAMMOGRAM  02/08/2017  . INFLUENZA VACCINE  02/19/2017  . COLONOSCOPY  07/22/2021  . PNA vac Low Risk Adult  Completed      Plan:    I have personally reviewed and addressed the Medicare Annual Wellness questionnaire and have noted the following in the patient's chart:  A. Medical and social history B. Use of alcohol, tobacco or illicit drugs  C. Current medications and supplements D. Functional ability and status E.  Nutritional status F.  Physical activity G. Advance directives H. List of other physicians I.  Hospitalizations, surgeries, and ER visits in previous 12 months J.  [place] such as hearing and vision if needed, cognitive and depression L. Referrals and appointments - Appt with Dr. Vicente Masson on 12/27/2016 at 10am  In addition, I have reviewed and discussed with patient certain preventive protocols, quality metrics, and best practice recommendations. A written personalized care plan for preventive services as well as general preventive health recommendations were provided to patient.   Signed,  Tyler Aas, LPN Nurse Health Advisor   MD Recommendations: Pt not fasting today, will have labs drawn at visit on 6/8 visit.  I have reviewed the health advisor's note, was available for consultation, and agree with documentation and plans.  [name]. Plonk MD

## 2016-12-10 ENCOUNTER — Telehealth: Payer: Self-pay | Admitting: Family Medicine

## 2016-12-10 NOTE — Telephone Encounter (Signed)
Nurse Remo Lipps from [facility] called about a bone density test -to close medicare gap by July 5. She can be reached at [phone]. Please advise

## 2016-12-17 NOTE — Telephone Encounter (Signed)
Advised NO NEED for this

## 2016-12-18 ENCOUNTER — Telehealth: Payer: Self-pay

## 2016-12-18 NOTE — Telephone Encounter (Signed)
Called patient to discuss options for PEG tube. Patient states in AWV that she was having a hard time keeping tube out of the way. Spoke with [facility]OC[facility] team, option for tube attachment device given to patient. She would not like to do this at this time due to having to change it frequently. Informed her she can try using paper tape to secure tube up, but discussed importance of not pulling tube to tight and away from insertion site. She will try this, will follow up with patient in 2-3 weeks.

## 2016-12-27 ENCOUNTER — Encounter: Payer: Self-pay | Admitting: Family Medicine

## 2016-12-27 ENCOUNTER — Ambulatory Visit (INDEPENDENT_AMBULATORY_CARE_PROVIDER_SITE_OTHER): Payer: Medicare HMO | Admitting: Family Medicine

## 2016-12-27 VITALS — BP 122/78 | HR 62 | Temp 97.3°F | Resp 16 | Ht 59.0 in | Wt 101.3 lb

## 2016-12-27 DIAGNOSIS — Z931 Gastrostomy status: Secondary | ICD-10-CM

## 2016-12-27 DIAGNOSIS — E039 Hypothyroidism, unspecified: Secondary | ICD-10-CM | POA: Diagnosis not present

## 2016-12-27 DIAGNOSIS — J40 Bronchitis, not specified as acute or chronic: Secondary | ICD-10-CM

## 2016-12-27 DIAGNOSIS — I10 Essential (primary) hypertension: Secondary | ICD-10-CM

## 2016-12-27 MED ORDER — AZITHROMYCIN 250 MG PO TABS: ORAL_TABLET | ORAL | 0 refills | Status: DC

## 2016-12-27 MED ORDER — LEVOTHYROXINE SODIUM 75 MCG PO TABS: 75.0000 ug | ORAL_TABLET | Freq: Every day | ORAL | 3 refills | Status: DC

## 2016-12-27 MED ORDER — AMLODIPINE BESYLATE 5 MG PO TABS: 5.0000 mg | ORAL_TABLET | Freq: Every day | ORAL | 3 refills | Status: DC

## 2016-12-27 MED ORDER — OMEPRAZOLE 20 MG PO CPDR: 20.0000 mg | DELAYED_RELEASE_CAPSULE | Freq: Every day | ORAL | 3 refills | Status: DC

## 2016-12-27 NOTE — Progress Notes (Signed)
Date:  12/27/2016   Name:  Crystal Kent   DOB:  18-Aug-1945   MRN:  [id number]  PCP:  Adline Potter, MD    Chief Complaint: Hypertension and Sinus Problem (Nasal drainage and cough with headache and some chills x 1 month. )   History of Present Illness:  This is a 71 y.o. female with one month hx NP cough, sinus congestion, occ chills. Sciatica resolved with Naprosyn. TSH ok in Nov at [name]. Has concerns re: feeding tube, buying replacements at cost on [name], wants to know if insurance would cover.  Saw ENT in May, all ok. D/B12 levels ok in December.  Review of Systems:  Review of Systems  Constitutional: Negative for fever.  HENT: Negative for ear pain.   Respiratory: Negative for shortness of breath.   Cardiovascular: Negative for chest pain and leg swelling.  Genitourinary: Negative for difficulty urinating.  Neurological: Negative for syncope and light-headedness.    Patient Active Problem List   Diagnosis Date Noted  . Pneumonia 07/26/2016  . 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 Hypertension 06/28/2016  . Allergic rhinitis 06/28/2016  . Current use of proton pump inhibitor 06/28/2016  . 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 Chronic pansinusitis 02/13/2016  . Eustachian tube dysfunction 12/14/2014  . Hearing loss 12/14/2014  . Lesion of buccal mucosa 12/14/2014  . Dysphagia, oropharyngeal 05/23/2014  . Hypothyroidism 04/27/2014  . 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 Authorizing Provider  amLODipine (NORVASC) 5 MG tablet Take 1 tablet (5 mg total) by mouth daily. 12/27/16  Yes Dimitria Ketchum, Gwyndolyn Saxon, MD  fluticasone Asencion Islam) 50 MCG/ACT nasal spray 2 sprays per nostril every morning 04/27/13  Yes [provider]  levothyroxine (SYNTHROID, LEVOTHROID) 75 MCG  tablet Take 1 tablet (75 mcg total) by mouth daily. 12/27/16  Yes Nidal Rivet, Gwyndolyn Saxon, MD  nystatin cream (MYCOSTATIN) once daily as needed.  05/02/16  Yes [provider]  omeprazole (PRILOSEC) 20 MG capsule Take 1 capsule (20 mg total) by mouth daily. 12/27/16  Yes Tryphena Perkovich, Gwyndolyn Saxon, MD  azithromycin (ZITHROMAX) 250 MG tablet Take two tablets today then one tablet daily for 4 days 12/27/16   Adline Potter, MD    No Known Allergies  Past Surgical History:  Procedure Laterality Date  . ABDOMINAL HYSTERECTOMY    . BREAST BIOPSY Left    bx/clip-neg  . MOUTH SURGERY     surgery after cancer    Social History  Substance Use Topics  . Smoking status: Never Smoker  . Smokeless tobacco: Never Used  . Alcohol use No    Family History  Problem Relation Age of Onset  . Alzheimer's disease Mother   . Heart attack Father     Medication list has been reviewed and updated.  Physical Examination: BP 122/78   Pulse 62   Temp 97.3 F (36.3 C) (Oral)   Resp 16   Ht 4\' 11"  (1.499 m)   Wt 101 lb 4.8 oz (45.9 kg)   SpO2 98%   BMI 20.46 kg/m   Physical Exam  Constitutional: She appears well-developed and well-nourished.  Cardiovascular: Normal rate, regular rhythm and normal heart sounds.   Pulmonary/Chest: Effort normal.  Scattered rhonchi  Musculoskeletal: She exhibits no edema.  Neurological: She is alert.  Skin: Skin is warm and dry.  Psychiatric: She  has a normal mood and affect. Her behavior is normal.  Nursing note and vitals reviewed.   Assessment and Plan:  1. Bronchitis/sinusitis Zpak as directed, call if sxs worsen/persist  2. Hypothyroidism, unspecified type Well controlled, refill Synthroid  3. Gastrostomy in place [name]) [facility] looked into pricing, cheaper to continue ordering through [name], refill Prilosec  4. Essential hypertension Well controlled, refill amlodipine  Return in about 3 months (around 03/29/2017).  [name]. [name]  12/27/2016

## 2017-01-02 ENCOUNTER — Telehealth: Payer: Self-pay

## 2017-01-02 NOTE — Telephone Encounter (Signed)
Thanks

## 2017-01-02 NOTE — Telephone Encounter (Signed)
Called pt's husband in response to the nurse call- pt finished ZPack 2 days ago, diarrhea has subsided but she continues to have a cough without a color to the production. She uses a "suction machine at home". I explained that the cough is usually the last thing to go and as long as there was no color, the infection was probably cleared up. Told him I would run past you and call them back

## 2017-01-02 NOTE — Telephone Encounter (Signed)
Spoke to husband- they will call if symptoms get worse/ told them the cough could go on for a couple more weeks. Pt is NOT running a fever at this time

## 2017-01-02 NOTE — Telephone Encounter (Signed)
Agree, abx stays in system for several days and cough is last symptom to resolve, call if sxs worsen or persist x 1 month

## 2017-01-17 ENCOUNTER — Encounter: Payer: Self-pay | Admitting: Family Medicine

## 2017-01-17 ENCOUNTER — Ambulatory Visit (INDEPENDENT_AMBULATORY_CARE_PROVIDER_SITE_OTHER): Payer: Medicare HMO | Admitting: Family Medicine

## 2017-01-17 VITALS — BP 104/80 | HR 78 | Temp 97.8°F | Resp 16 | Ht 59.0 in | Wt 102.0 lb

## 2017-01-17 DIAGNOSIS — J181 Lobar pneumonia, unspecified organism: Secondary | ICD-10-CM

## 2017-01-17 DIAGNOSIS — E039 Hypothyroidism, unspecified: Secondary | ICD-10-CM | POA: Diagnosis not present

## 2017-01-17 DIAGNOSIS — I10 Essential (primary) hypertension: Secondary | ICD-10-CM | POA: Diagnosis not present

## 2017-01-17 DIAGNOSIS — R1312 Dysphagia, oropharyngeal phase: Secondary | ICD-10-CM

## 2017-01-17 DIAGNOSIS — J189 Pneumonia, unspecified organism: Secondary | ICD-10-CM

## 2017-01-17 MED ORDER — LEVOFLOXACIN 750 MG PO TABS: 750.0000 mg | ORAL_TABLET | Freq: Every day | ORAL | 0 refills | Status: DC

## 2017-01-17 NOTE — Progress Notes (Signed)
Date:  01/17/2017   Name:  Crystal Kent   DOB:  January 19, 1946   MRN:  [id number]  PCP:  Adline Potter, MD    Chief Complaint: Cough (2 weeks of severe cough had to leave beach early); Headache (2 weeks); and Neck Pain (2-3 days)   History of Present Illness:  This is a 71 y.o. female seen for acute visit. Seen three weeks ago for bronchitis, placed on Zpak without improvement, cough now worse, still NP, also c/o sore throat, rhinorrhea, and frontal HA. Possible fever last night.  Review of Systems:  Review of Systems  Constitutional: Negative for chills.  HENT: Negative for ear pain.   Respiratory: Negative for shortness of breath.   Cardiovascular: Negative for chest pain and leg swelling.  Gastrointestinal: Negative for nausea and vomiting.  Genitourinary: Negative for difficulty urinating.  Neurological: Negative for syncope and light-headedness.    Patient Active Problem List   Diagnosis Date Noted  . Pneumonia 07/26/2016  . 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 Hypertension 06/28/2016  . Allergic rhinitis 06/28/2016  . Current use of proton pump inhibitor 06/28/2016  . Gastrostomy in place [facility]) 05/02/2016  . Chronic pansinusitis 02/13/2016  . Eustachian tube dysfunction 12/14/2014  . Hearing loss 12/14/2014  . Lesion of buccal mucosa 12/14/2014  . Dysphagia, oropharyngeal 05/23/2014  . Hypothyroidism 04/27/2014  . 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 Malignant neoplasm of base of tongue ([place]) 10/09/2011  . 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 Authorizing Provider  amLODipine (NORVASC) 5 MG tablet Take 1 tablet (5 mg total) by mouth daily. 12/27/16  Yes Shoshanna Mcquitty, Gwyndolyn Saxon, MD  fluticasone (FLONASE) 50 MCG/ACT nasal spray Place 2 sprays into both nostrils daily as needed. 04/27/13  Yes [provider]  levothyroxine (SYNTHROID, LEVOTHROID) 75 MCG tablet  Take 1 tablet (75 mcg total) by mouth daily. 12/27/16  Yes Shalisha Clausing, Gwyndolyn Saxon, MD  nystatin cream (MYCOSTATIN) once daily as needed.  05/02/16  Yes [provider]  omeprazole (PRILOSEC) 20 MG capsule Take 1 capsule (20 mg total) by mouth daily. 12/27/16  Yes Amaad Byers, Gwyndolyn Saxon, MD  levofloxacin (LEVAQUIN) 750 MG tablet Take 1 tablet (750 mg total) by mouth daily. 01/17/17   Adline Potter, MD    No Known Allergies  Past Surgical History:  Procedure Laterality Date  . ABDOMINAL HYSTERECTOMY    . BREAST BIOPSY Left    bx/clip-neg  . MOUTH SURGERY     surgery after cancer    Social History  Substance Use Topics  . Smoking status: Never Smoker  . Smokeless tobacco: Never Used  . Alcohol use No    Family History  Problem Relation Age of Onset  . Alzheimer's disease Mother   . Heart attack Father     Medication list has been reviewed and updated.  Physical Examination: BP 104/80   Pulse 78   Temp 97.8 F (36.6 C) (Oral)   Resp 16   Ht 4\' 11"  (1.499 m)   Wt 102 lb (46.3 kg)   SpO2 96%   BMI 20.60 kg/m   Physical Exam  Constitutional: She appears well-developed and well-nourished. No distress.  Cardiovascular: Normal rate, regular rhythm and normal heart sounds.   Pulmonary/Chest: Effort normal.  Rales RLL  Musculoskeletal: She exhibits no edema.  Neurological: She is alert.  Skin: Skin is warm and dry. She is not diaphoretic.  Psychiatric: She has a normal mood  and affect. Her behavior is normal.  Nursing note and vitals reviewed.   Assessment and Plan:  1. Pneumonia of right lower lobe due to infectious organism ([place]) Levaquin 750 mg daily x 5d, call if sxs unimproved 48h, to urgent care or ED if sxs worsen  2. Hypothyroidism, unspecified type Well controlled on Synthroid (TSH ok in Nov)  3. Dysphagia, oropharyngeal G-tube in place, followed by [facility] ENT  4. Essential hypertension Overcontrolled today, consider d/c amlodipine next visit  Return in about 2  months (around 03/19/2017).  [name]. [facility]  01/17/2017

## 2017-01-23 ENCOUNTER — Ambulatory Visit (INDEPENDENT_AMBULATORY_CARE_PROVIDER_SITE_OTHER): Payer: Medicare HMO | Admitting: Family Medicine

## 2017-01-23 ENCOUNTER — Encounter: Payer: Self-pay | Admitting: Family Medicine

## 2017-01-23 VITALS — BP 112/78 | HR 84 | Temp 98.1°F | Resp 16 | Ht 59.0 in | Wt 102.0 lb

## 2017-01-23 DIAGNOSIS — F5105 Insomnia due to other mental disorder: Secondary | ICD-10-CM | POA: Diagnosis not present

## 2017-01-23 DIAGNOSIS — J181 Lobar pneumonia, unspecified organism: Secondary | ICD-10-CM | POA: Diagnosis not present

## 2017-01-23 DIAGNOSIS — R0981 Nasal congestion: Secondary | ICD-10-CM

## 2017-01-23 DIAGNOSIS — R0989 Other specified symptoms and signs involving the circulatory and respiratory systems: Secondary | ICD-10-CM

## 2017-01-23 DIAGNOSIS — J189 Pneumonia, unspecified organism: Secondary | ICD-10-CM

## 2017-01-23 DIAGNOSIS — I10 Essential (primary) hypertension: Secondary | ICD-10-CM | POA: Diagnosis not present

## 2017-01-23 MED ORDER — MIRTAZAPINE 15 MG PO TABS: 15.0000 mg | ORAL_TABLET | Freq: Every day | ORAL | 2 refills | Status: DC

## 2017-01-23 NOTE — Progress Notes (Signed)
Date:  01/23/2017   Name:  Crystal Kent   DOB:  May 11, 1946   MRN:  [id number]  PCP:  Adline Potter, MD    Chief Complaint: Cough (Still has non productive cough and has had for few weeks now. Feels like it is in throat and head and sinus cavities not in chest. )   History of Present Illness:  This is a 71 y.o. female seen for 6d f/u from RLL pneumonia, completed Levaquin course, feeling better overall but still c/o nasal congestion, R ear fullness, phlegm in throat she can't mobilize. Also c/o insomnia, tearful about death of sons in past.  Review of Systems:  Review of Systems  Constitutional: Negative for chills and fever.  HENT: Negative for rhinorrhea.   Respiratory: Negative for shortness of breath.   Cardiovascular: Negative for chest pain.  Neurological: Negative for syncope and light-headedness.    Patient Active Problem List   Diagnosis Date Noted  . Pneumonia 07/26/2016  . 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 Hypertension 06/28/2016  . Allergic rhinitis 06/28/2016  . Current use of proton pump inhibitor 06/28/2016  . Gastrostomy in place [facility]) 05/02/2016  . Chronic pansinusitis 02/13/2016  . Eustachian tube dysfunction 12/14/2014  . Hearing loss 12/14/2014  . Lesion of buccal mucosa 12/14/2014  . Dysphagia, oropharyngeal 05/23/2014  . Hypothyroidism 04/27/2014  . 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 Authorizing Provider  amLODipine (NORVASC) 5 MG tablet Take 1 tablet (5 mg total) by mouth daily. 12/27/16  Yes Phyliss Hulick, Gwyndolyn Saxon, MD  fluticasone (FLONASE) 50 MCG/ACT nasal spray Place 2 sprays into both nostrils daily as needed. 04/27/13  Yes [provider]  levothyroxine (SYNTHROID, LEVOTHROID) 75 MCG tablet Take 1 tablet (75 mcg total) by mouth daily.  12/27/16  Yes Delsa Walder, Gwyndolyn Saxon, MD  nystatin cream (MYCOSTATIN) once daily as needed.  05/02/16  Yes [provider]  omeprazole (PRILOSEC) 20 MG capsule Take 1 capsule (20 mg total) by mouth daily. 12/27/16  Yes Ima Hafner, Gwyndolyn Saxon, MD  mirtazapine (REMERON) 15 MG tablet Take 1 tablet (15 mg total) by mouth at bedtime. 01/23/17   Adline Potter, MD    No Known Allergies  Past Surgical History:  Procedure Laterality Date  . ABDOMINAL HYSTERECTOMY    . BREAST BIOPSY Left    bx/clip-neg  . MOUTH SURGERY     surgery after cancer    Social History  Substance Use Topics  . Smoking status: Never Smoker  . Smokeless tobacco: Never Used  . Alcohol use No    Family History  Problem Relation Age of Onset  . Alzheimer's disease Mother   . Heart attack Father     Medication list has been reviewed and updated.  Physical Examination: BP 112/78   Pulse 84   Temp 98.1 F (36.7 C) (Oral)   Resp 16   Ht 4\' 11"  (1.499 m)   Wt 102 lb (46.3 kg)   SpO2 99%   BMI 20.60 kg/m   Physical Exam  Constitutional: She appears well-developed and well-nourished.  HENT:  OP postsurgical with thick phlegm No sinus tenderness  Eyes:  TMs clear  Neck: Neck supple. No thyromegaly present.  Cardiovascular: Normal rate, regular rhythm and normal heart sounds.   Pulmonary/Chest: Effort normal. She has no wheezes.  Rales R base  Musculoskeletal: She  exhibits no edema.  Lymphadenopathy:    She has no cervical adenopathy.  Neurological: She is alert.  Skin: Skin is warm and dry.  Psychiatric: She has a normal mood and affect. Her behavior is normal.  Nursing note and vitals reviewed.   Assessment and Plan:  1. Community acquired pneumonia of right lower lobe of lung ([place]) Improved s/p Levaquin  2. Sinus congestion Afrin NS prn  3. Phlegm in throat Robitussin prn  4. Insomnia due to mental condition Begin Remeron qhs  5. Essential hypertension Well controlled on amlodipine  Return if  symptoms worsen or fail to improve.  [name]. [facility]  01/23/2017

## 2017-01-23 NOTE — Patient Instructions (Addendum)
Obtain OTC Robitussin and Afrin nasal spray (generic ok) as use as directed to thin secretions and decrease sinus congestion. Pneumonia appears improved.

## 2017-02-20 ENCOUNTER — Encounter: Payer: Self-pay | Admitting: *Deleted

## 2017-02-25 NOTE — Discharge Instructions (Signed)
[facility] °DISCHARGE INSTRUCTIONS FOR MYRINGOTOMY AND TUBE INSERTION ° °[facility] °PAUL JUENGEL, M.D. °CHAPMAN T. MCQUEEN, M.D. °SCOTT BENNETT, M.D. °CREIGHTON VAUGHT, M.D. ° °Diet:   After surgery, the patient should take only liquids and foods as tolerated.  The patient may then have a regular diet after the effects of anesthesia have worn off, usually about four to six hours after surgery. ° °Activities:   The patient should rest until the effects of anesthesia have worn off.  After this, there are no restrictions on the normal daily activities. ° °Medications:   You will be given antibiotic drops to be used in the ears postoperatively.  It is recommended to use 3 drops 3 times a day for 3 days, then the drops should be saved for possible future use. ° °The tubes should not cause any discomfort to the patient, but if there is any question, Tylenol should be given according to the instructions for the age of the patient. ° °Other medications should be continued normally. ° °Precautions:   Should there be recurrent drainage after the tubes are placed, the drops should be used for approximately 3-4 days.  If it does not clear, you should call the ENT office. ° °Earplugs:   Earplugs are only needed for those who are going to be submerged under water.  When taking a bath or shower and using a cup or showerhead to rinse hair, it is not necessary to wear earplugs.  These come in a variety of fashions, all of which can be obtained at our office.  However, if one is not able to come by the office, then silicone plugs can be found at most pharmacies.  It is not advised to stick anything in the ear that is not approved as an earplug.  Silly putty is not to be used as an earplug.  Swimming is allowed in patients after ear tubes are inserted, however, they must wear earplugs if they are going to be submerged under water.  For those children who are going to be swimming a lot, it is  recommended to use a fitted ear mold, which can be made by our audiologist.  If discharge is noticed from the ears, this most likely represents an ear infection.  We would recommend getting your eardrops and using them as indicated above.  If it does not clear, then you should call the ENT office.  For follow up, the patient should return to the ENT office three weeks postoperatively and then every six months as required by the doctor. ° ° °General Anesthesia, Adult, Care After °These instructions provide you with information about caring for yourself after your procedure. Your health care provider may also give you more specific instructions. Your treatment has been planned according to current medical practices, but problems sometimes occur. Call your health care provider if you have any problems or questions after your procedure. °What can I expect after the procedure? °After the procedure, it is common to have: °· Vomiting. °· A sore throat. °· Mental slowness. ° °It is common to feel: °· Nauseous. °· Cold or shivery. °· Sleepy. °· Tired. °· Sore or achy, even in parts of your body where you did not have surgery. ° °Follow these instructions at home: °For at least 24 hours after the procedure: °· Do not: °? Participate in activities where you could fall or become injured. °? Drive. °? Use heavy machinery. °? Drink alcohol. °? Take sleeping pills or   medicines that cause drowsiness. °? Make important decisions or sign legal documents. °? Take care of children on your own. °· Rest. °Eating and drinking °· If you vomit, drink water, juice, or soup when you can drink without vomiting. °· Drink enough fluid to keep your urine clear or pale yellow. °· Make sure you have little or no nausea before eating solid foods. °· Follow the diet recommended by your health care provider. °General instructions °· Have a responsible adult stay with you until you are awake and alert. °· Return to your normal activities as told by  your health care provider. Ask your health care provider what activities are safe for you. °· Take over-the-counter and prescription medicines only as told by your health care provider. °· If you smoke, do not smoke without supervision. °· Keep all follow-up visits as told by your health care provider. This is important. °Contact a health care provider if: °· You continue to have nausea or vomiting at home, and medicines are not helpful. °· You cannot drink fluids or start eating again. °· You cannot urinate after 8-12 hours. °· You develop a skin rash. °· You have fever. °· You have increasing redness at the site of your procedure. °Get help right away if: °· You have difficulty breathing. °· You have chest pain. °· You have unexpected bleeding. °· You feel that you are having a life-threatening or urgent problem. °This information is not intended to replace advice given to you by your health care provider. Make sure you discuss any questions you have with your health care provider. °Document Released: 10/14/2000 Document Revised: 12/11/2015 Document Reviewed: 06/22/2015 °Elsevier Interactive Patient Education © 2018 [employer] ° °

## 2017-02-27 ENCOUNTER — Encounter
Admission: RE | Disposition: A | Discharge status: Home / Self Care | Payer: Self-pay | Source: Ambulatory Visit | Attending: Otolaryngology

## 2017-02-27 ENCOUNTER — Ambulatory Visit: Payer: Medicare HMO | Admitting: Anesthesiology

## 2017-02-27 ENCOUNTER — Encounter: Payer: Self-pay | Admitting: Otolaryngology

## 2017-02-27 ENCOUNTER — Ambulatory Visit
Admission: RE | Admit: 2017-02-27 | Discharge: 2017-02-27 | Disposition: A | Discharge status: Home / Self Care | Payer: Medicare HMO | Source: Ambulatory Visit | Attending: Otolaryngology | Admitting: Otolaryngology

## 2017-02-27 DIAGNOSIS — K219 Gastro-esophageal reflux disease without esophagitis: Secondary | ICD-10-CM | POA: Insufficient documentation

## 2017-02-27 DIAGNOSIS — E039 Hypothyroidism, unspecified: Secondary | ICD-10-CM | POA: Diagnosis not present

## 2017-02-27 DIAGNOSIS — H6521 Chronic serous otitis media, right ear: Secondary | ICD-10-CM | POA: Insufficient documentation

## 2017-02-27 DIAGNOSIS — H6991 Unspecified Eustachian tube disorder, right ear: Secondary | ICD-10-CM | POA: Insufficient documentation

## 2017-02-27 DIAGNOSIS — Z85818 Personal history of malignant neoplasm of other sites of lip, oral cavity, and pharynx: Secondary | ICD-10-CM | POA: Insufficient documentation

## 2017-02-27 DIAGNOSIS — I10 Essential (primary) hypertension: Secondary | ICD-10-CM | POA: Insufficient documentation

## 2017-02-27 HISTORY — DX: Presence of other specified devices: Z97.8

## 2017-02-27 HISTORY — PX: MYRINGOTOMY WITH TUBE PLACEMENT: SHX5663

## 2017-02-27 HISTORY — DX: Failed or difficult intubation, initial encounter: T88.4XXA

## 2017-02-27 SURGERY — MYRINGOTOMY WITH TUBE PLACEMENT
Anesthesia: General | Laterality: Right | Wound class: Clean Contaminated

## 2017-02-27 MED ORDER — GLYCOPYRROLATE 0.2 MG/ML IJ SOLN
INTRAMUSCULAR | Status: DC | PRN
  Administered 2017-02-27: 0.1 mg via INTRAVENOUS

## 2017-02-27 MED ORDER — FENTANYL CITRATE (PF) 100 MCG/2ML IJ SOLN: 25.0000 ug | INTRAMUSCULAR | Status: DC | PRN

## 2017-02-27 MED ORDER — MIDAZOLAM HCL 5 MG/5ML IJ SOLN
INTRAMUSCULAR | Status: DC | PRN
  Administered 2017-02-27: 1 mg via INTRAVENOUS

## 2017-02-27 MED ORDER — OXYCODONE HCL 5 MG/5ML PO SOLN: 5.0000 mg | Freq: Once | ORAL | Status: DC | PRN

## 2017-02-27 MED ORDER — LACTATED RINGERS IV SOLN: INTRAVENOUS | Status: DC

## 2017-02-27 MED ORDER — LACTATED RINGERS IV SOLN
INTRAVENOUS | Status: DC
  Administered 2017-02-27: 11:00:00 via INTRAVENOUS

## 2017-02-27 MED ORDER — OXYCODONE HCL 5 MG PO TABS: 5.0000 mg | ORAL_TABLET | Freq: Once | ORAL | Status: DC | PRN

## 2017-02-27 MED ORDER — PROPOFOL 10 MG/ML IV BOLUS
INTRAVENOUS | Status: DC | PRN
  Administered 2017-02-27: 20 mg via INTRAVENOUS
  Administered 2017-02-27: 60 mg via INTRAVENOUS

## 2017-02-27 MED ORDER — ONDANSETRON HCL 4 MG/2ML IJ SOLN
4.0000 mg | Freq: Once | INTRAMUSCULAR | Status: AC | PRN
  Administered 2017-02-27: 4 mg via INTRAVENOUS

## 2017-02-27 MED ORDER — LIDOCAINE HCL (CARDIAC) 20 MG/ML IV SOLN
INTRAVENOUS | Status: DC | PRN
  Administered 2017-02-27: 40 mg via INTRAVENOUS

## 2017-02-27 MED ORDER — CIPROFLOXACIN-DEXAMETHASONE 0.3-0.1 % OT SUSP
OTIC | Status: DC | PRN
  Administered 2017-02-27: 4 [drp] via OTIC

## 2017-02-27 SURGICAL SUPPLY — 12 items
BLADE MYR LANCE NRW W/HDL (BLADE) ×3 IMPLANT
CANISTER SUCT 1200ML W/VALVE (MISCELLANEOUS) ×3 IMPLANT
COTTONBALL LRG STERILE PKG (GAUZE/BANDAGES/DRESSINGS) ×3 IMPLANT
GLOVE PI ULTRA LF STRL 7.5 (GLOVE) ×1 IMPLANT
GLOVE PI ULTRA NON LATEX 7.5 (GLOVE) ×2
STRAP BODY AND KNEE 60X3 (MISCELLANEOUS) ×3 IMPLANT
TOWEL OR 17X26 4PK STRL BLUE (TOWEL DISPOSABLE) ×3 IMPLANT
TUBE EAR ARMSTRONG FL 1.14X4.5 (OTOLOGIC RELATED) ×6 IMPLANT
TUBE EAR T 1.27X4.5 GO LF (OTOLOGIC RELATED) IMPLANT
TUBE EAR T 1.27X5.3 BFLY (OTOLOGIC RELATED) IMPLANT
TUBING CONN 6MMX3.1M (TUBING) ×2
TUBING SUCTION CONN 0.25 STRL (TUBING) ×1 IMPLANT

## 2017-02-27 NOTE — Anesthesia Postprocedure Evaluation (Signed)
Anesthesia Post Note  Patient: Crystal Kent  Procedure(s) Performed: Procedure(s) (LRB): MYRINGOTOMY WITH TUBE PLACEMENT Right ear. (Right)  Patient location during evaluation: PACU Anesthesia Type: General Level of consciousness: awake and alert, oriented and patient cooperative Pain management: pain level controlled Vital Signs Assessment: post-procedure vital signs reviewed and stable Respiratory status: spontaneous breathing, nonlabored ventilation and respiratory function stable Cardiovascular status: blood pressure returned to baseline and stable Postop Assessment: adequate PO intake Anesthetic complications: no    Darrin Nipper

## 2017-02-27 NOTE — Anesthesia Preprocedure Evaluation (Signed)
Anesthesia Evaluation  Patient identified by MRN, date of birth, ID band Patient awake    Reviewed: Allergy & Precautions, NPO status , Patient's Chart, lab work & pertinent test results  History of Anesthesia Complications (+) DIFFICULT AIRWAY and history of anesthetic complications  Airway Mallampati: IV  TM Distance: <3 FB Neck ROM: Full  Mouth opening: Limited Mouth Opening  Dental  (+) Upper Dentures   Pulmonary neg pulmonary ROS,    Pulmonary exam normal breath sounds clear to auscultation       Cardiovascular Exercise Tolerance: Good hypertension, Normal cardiovascular exam Rhythm:Regular Rate:Normal  ECG 07/26/16: Sinus tachycardia (HR 104) with occasional PVCs, otherwise normal   Neuro/Psych negative neurological ROS     GI/Hepatic GERD  ,  Endo/Other  Hypothyroidism   Renal/GU negative Renal ROS     Musculoskeletal   Abdominal   Peds  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   Anesthesia Other Findings   Reproductive/Obstetrics                             Anesthesia Physical Anesthesia Plan  ASA: III  Anesthesia Plan: General   Post-op Pain Management:    Induction: Intravenous  PONV Risk Score and Plan: 2 and Ondansetron and Propofol infusion  Airway Management Planned: Mask  Additional Equipment:   Intra-op Plan:   Post-operative Plan:   Informed Consent: I have reviewed the patients History and Physical, chart, labs and discussed the procedure including the risks, benefits and alternatives for the proposed anesthesia with the patient or authorized representative who has indicated his/her understanding and acceptance.     Plan Discussed with: CRNA  Anesthesia Plan Comments:         Anesthesia Quick Evaluation

## 2017-02-27 NOTE — Transfer of Care (Signed)
Immediate Anesthesia Transfer of Care Note  Patient: Crystal Kent  Procedure(s) Performed: Procedure(s): MYRINGOTOMY WITH TUBE PLACEMENT Right ear. (Right)  Patient Location: PACU  Anesthesia Type: General  Level of Consciousness: awake, alert  and patient cooperative  Airway and Oxygen Therapy: Patient Spontanous Breathing and Patient connected to supplemental oxygen  Post-op Assessment: Post-op Vital signs reviewed, Patient's Cardiovascular Status Stable, Respiratory Function Stable, Patent Airway and No signs of Nausea or vomiting  Post-op Vital Signs: Reviewed and stable  Complications: No apparent anesthesia complications

## 2017-02-27 NOTE — H&P (Signed)
H&P has been reviewed and pt reevaluated, and no changes necessary. To be downloaded later.  

## 2017-02-27 NOTE — Op Note (Signed)
02/27/2017  11:16 AM    Crystal Kent  [id number]   Pre-Op Dx:  Right eustachian tube dysfunction secondary to previous cancer surgery, chronic right serous otitis media  Post-op Dx: Same  Proc: Right myringotomy and placement of butterfly tube   Surg:  [name]  Anes:  GOT  EBL:  None  Comp:  None  Findings:  Yellow serous fluid behind the eardrum her right side.  Procedure: The patient was brought to the operating suite and placed in a supine position. She was given general anesthesia by mask. The right ear canal was visualized high-power microscope. The eardrum was thin and translucent and the middle ear space was mostly filled with a yellow serous fluid with 1 bubble near the middle. A radial incision was created in the anterior inferior quadrant and the fluid was suctioned from behind the eardrum. The middle ear space was clear with no signs of acute inflammation. A butterfly tube was then placed into the myringotomy hole. The sat in good position. Ciprodex drops were then placed into the ear canal to coat this. The left ear canal was then visualized time are microscope and the ear canal was clear and the eardrum was thin and translucent with a clear middle ear space.  Patient tolerated procedure well. She was awakened taken to the recovery room in satisfactory condition. There were no operative complications.  Dispo:   To PACU to be discharged home.  Plan:  To follow-up in the office in a couple weeks with an audiogram. She'll keep water out of her ear. She'll use Ciprodex drops for 3 days as prevention.  [name]  02/27/2017 11:16 AM

## 2017-03-01 ENCOUNTER — Encounter: Payer: Self-pay | Admitting: Otolaryngology

## 2017-04-23 ENCOUNTER — Ambulatory Visit (INDEPENDENT_AMBULATORY_CARE_PROVIDER_SITE_OTHER): Payer: Medicare HMO | Admitting: Family Medicine

## 2017-04-23 ENCOUNTER — Encounter: Payer: Self-pay | Admitting: Family Medicine

## 2017-04-23 VITALS — BP 138/80 | HR 84 | Temp 97.6°F | Resp 16 | Ht 59.0 in | Wt 104.1 lb

## 2017-04-23 DIAGNOSIS — G4700 Insomnia, unspecified: Secondary | ICD-10-CM

## 2017-04-23 DIAGNOSIS — C050 Malignant neoplasm of hard palate: Secondary | ICD-10-CM

## 2017-04-23 DIAGNOSIS — L723 Sebaceous cyst: Secondary | ICD-10-CM | POA: Diagnosis not present

## 2017-04-23 DIAGNOSIS — I10 Essential (primary) hypertension: Secondary | ICD-10-CM | POA: Diagnosis not present

## 2017-04-23 DIAGNOSIS — Z23 Encounter for immunization: Secondary | ICD-10-CM | POA: Diagnosis not present

## 2017-04-23 NOTE — Patient Instructions (Addendum)
Influenza (Flu) Vaccine (Inactivated or Recombinant): What You Need to Know 1. Why get vaccinated? Influenza ("flu") is a contagious disease that spreads around the [place] every year, usually between October and May. Flu is caused by influenza viruses, and is spread mainly by coughing, sneezing, and close contact. Anyone can get flu. Flu strikes suddenly and can last several days. Symptoms vary by age, but can include:  fever/chills  sore throat  muscle aches  fatigue  cough  headache  runny or stuffy nose  Flu can also lead to pneumonia and blood infections, and cause diarrhea and seizures in children. If you have a medical condition, such as heart or lung disease, flu can make it worse. Flu is more dangerous for some people. Infants and young children, people 44 years of age and older, pregnant women, and people with certain health conditions or a weakened immune system are at greatest risk. Each year thousands of people in the [place] die from flu, and many more are hospitalized. Flu vaccine can:  keep you from getting flu,  make flu less severe if you do get it, and  keep you from spreading flu to your family and other people. 2. Inactivated and recombinant flu vaccines A dose of flu vaccine is recommended every flu season. Children 6 months through 81 years of age may need two doses during the same flu season. Everyone else needs only one dose each flu season. Some inactivated flu vaccines contain a very small amount of a mercury-based preservative called thimerosal. Studies have not shown thimerosal in vaccines to be harmful, but flu vaccines that do not contain thimerosal are available. There is no live flu virus in flu shots. They cannot cause the flu. There are many flu viruses, and they are always changing. Each year a new flu vaccine is made to protect against three or four viruses that are likely to cause disease in the upcoming flu season. But even when  the vaccine doesn't exactly match these viruses, it may still provide some protection. Flu vaccine cannot prevent:  flu that is caused by a virus not covered by the vaccine, or  illnesses that look like flu but are not.  It takes about 2 weeks for protection to develop after vaccination, and protection lasts through the flu season. 3. Some people should not get this vaccine Tell the person who is giving you the vaccine:  If you have any severe, life-threatening allergies. If you ever had a life-threatening allergic reaction after a dose of flu vaccine, or have a severe allergy to any part of this vaccine, you may be advised not to get vaccinated. Most, but not all, types of flu vaccine contain a small amount of egg protein.  If you ever had Guillain-Barr Syndrome (also called GBS). Some people with a history of GBS should not get this vaccine. This should be discussed with your doctor.  If you are not feeling well. It is usually okay to get flu vaccine when you have a mild illness, but you might be asked to come back when you feel better.  4. Risks of a vaccine reaction With any medicine, including vaccines, there is a chance of reactions. These are usually mild and go away on their own, but serious reactions are also possible. Most people who get a flu shot do not have any problems with it. Minor problems following a flu shot include:  soreness, redness, or swelling where the shot was given  hoarseness  red or itchy eyes  cough  fever  aches  headache  itching  fatigue  If these problems occur, they usually begin soon after the shot and last 1 or 2 days. More serious problems following a flu shot can include the following:  There may be a small increased risk of Guillain-Barre Syndrome (GBS) after inactivated flu vaccine. This risk has been estimated at 1 or 2 additional cases per million people vaccinated. This is much lower than the risk of severe complications from  flu, which can be prevented by flu vaccine.  Young children who get the flu shot along with pneumococcal vaccine (PCV13) and/or DTaP vaccine at the same time might be slightly more likely to have a seizure caused by fever. Ask your doctor for more information. Tell your doctor if a child who is getting flu vaccine has ever had a seizure.  Problems that could happen after any injected vaccine:  People sometimes faint after a medical procedure, including vaccination. Sitting or lying down for about 15 minutes can help prevent fainting, and injuries caused by a fall. Tell your doctor if you feel dizzy, or have vision changes or ringing in the ears.  Some people get severe pain in the shoulder and have difficulty moving the arm where a shot was given. This happens very rarely.  Any medication can cause a severe allergic reaction. Such reactions from a vaccine are very rare, estimated at about 1 in a million doses, and would happen within a few minutes to a few hours after the vaccination. As with any medicine, there is a very remote chance of a vaccine causing a serious injury or death. The safety of vaccines is always being monitored. For more information, visit: www.cdc.gov/vaccinesafety/ 5. What if there is a serious reaction? What should I look for? Look for anything that concerns you, such as signs of a severe allergic reaction, very high fever, or unusual behavior. Signs of a severe allergic reaction can include hives, swelling of the face and throat, difficulty breathing, a fast heartbeat, dizziness, and weakness. These would start a few minutes to a few hours after the vaccination. What should I do?  If you think it is a severe allergic reaction or other emergency that can't wait, call 9-1-1 and get the person to the nearest hospital. Otherwise, call your doctor.  Reactions should be reported to the Vaccine Adverse Event Reporting System (VAERS). Your doctor should file this report, or you  can do it yourself through the VAERS web site at www.vaers.hhs.gov, or by calling 1-800-822-7967. ? VAERS does not give medical advice. 6. The National Vaccine Injury Compensation Program The National Vaccine Injury Compensation Program (VICP) is a federal program that was created to compensate people who may have been injured by certain vaccines. Persons who believe they may have been injured by a vaccine can learn about the program and about filing a claim by calling 1-800-338-2382 or visiting the VICP website at www.hrsa.gov/vaccinecompensation. There is a time limit to file a claim for compensation. 7. How can I learn more?  Ask your healthcare provider. He or she can give you the vaccine package insert or suggest other sources of information.  Call your local or state health department.  Contact the Centers for Disease Control and Prevention (CDC): ? Call 1-800-232-4636 (1-800-CDC-INFO) or ? Visit CDC's website at www.cdc.gov/flu Vaccine Information Statement, Inactivated Influenza Vaccine (02/25/2014) This information is not intended to replace advice given to you by your health care provider. Make sure   sure you discuss any questions you have with your health care provider. Document Released: 05/02/2006 Document Revised: 03/28/2016 Document Reviewed: 03/28/2016 Elsevier Interactive Patient Education  2017 [employer] DTaP Vaccine (Diphtheria, Tetanus, and Pertussis): What You Need to Know 1. Why get vaccinated? Diphtheria, tetanus, and pertussis are serious diseases caused by bacteria. Diphtheria and pertussis are spread from person to person. Tetanus enters the body through cuts or wounds. DIPHTHERIA causes a thick covering in the back of the throat.  It can lead to breathing problems, paralysis, heart failure, and even death.  TETANUS (Lockjaw) causes painful tightening of the muscles, usually all over the body.  It can lead to "locking" of the jaw so the victim cannot open his  mouth or swallow. Tetanus leads to death in up to 2 out of 10 cases.  PERTUSSIS (Whooping Cough) causes coughing spells so bad that it is hard for infants to eat, drink, or breathe. These spells can last for weeks.  It can lead to pneumonia, seizures (jerking and staring spells), brain damage, and death.  Diphtheria, tetanus, and pertussis vaccine (DTaP) can help prevent these diseases. Most children who are vaccinated with DTaP will be protected throughout childhood. Many more children would get these diseases if we stopped vaccinating. DTaP is a safer version of an older vaccine called DTP. DTP is no longer used in the [place]. 2. Who should get DTaP vaccine and when? Children should get 5 doses of DTaP vaccine, one dose at each of the following ages:  2 months  4 months  6 months  15-18 months  4-6 years  DTaP may be given at the same time as other vaccines. 3. Some children should not get DTaP vaccine or should wait  Children with minor illnesses, such as a cold, may be vaccinated. But children who are moderately or severely ill should usually wait until they recover before getting DTaP vaccine.  Any child who had a life-threatening allergic reaction after a dose of DTaP should not get another dose.  Any child who suffered a brain or nervous system disease within 7 days after a dose of DTaP should not get another dose.  Talk with your doctor if your child: ? had a seizure or collapsed after a dose of DTaP, ? cried non-stop for 3 hours or more after a dose of DTaP, ? had a fever over 105F after a dose of DTaP. Ask your doctor for more information. Some of these children should not get another dose of pertussis vaccine, but may get a vaccine without pertussis, called DT. 4. Older children and adults DTaP is not licensed for adolescents, adults, or children 5 years of age and older. But older people still need protection. A vaccine called Tdap is similar to DTaP. A single  dose of Tdap is recommended for people 11 through 72 years of age. Another vaccine, called Td, protects against tetanus and diphtheria, but not pertussis. It is recommended every 10 years. There are separate Vaccine Information Statements for these vaccines. 5. What are the risks from DTaP vaccine? Getting diphtheria, tetanus, or pertussis disease is much riskier than getting DTaP vaccine. However, a vaccine, like any medicine, is capable of causing serious problems, such as severe allergic reactions. The risk of DTaP vaccine causing serious harm, or death, is extremely small. Mild problems (common)  Fever (up to about 1 child in 4)  Redness or swelling where the shot was given (up to about 1 child in 4)  Soreness or  tenderness where the shot was given (up to about 1 child in 4) These problems occur more often after the 4th and 5th doses of the DTaP series than after earlier doses. Sometimes the 4th or 5th dose of DTaP vaccine is followed by swelling of the entire arm or leg in which the shot was given, lasting 1-7 days (up to about 1 child in 24). Other mild problems include:  Fussiness (up to about 1 child in 3)  Tiredness or poor appetite (up to about 1 child in 10)  Vomiting (up to about 1 child in 45) These problems generally occur 1-3 days after the shot. Moderate problems (uncommon)  Seizure (jerking or staring) (about 1 child out of 14,000)  Non-stop crying, for 3 hours or more (up to about 1 child out of 1,000)  High fever, over 105F (about 1 child out of 16,000) Severe problems (very rare)  Serious allergic reaction (less than 1 out of a million doses)  Several other severe problems have been reported after DTaP vaccine. These include: ? Long-term seizures, coma, or lowered consciousness ? Permanent brain damage. These are so rare it is hard to tell if they are caused by the vaccine. Controlling fever is especially important for children who have had seizures, for any  reason. It is also important if another family member has had seizures. You can reduce fever and pain by giving your child an aspirin-free pain reliever when the shot is given, and for the next 24 hours, following the package instructions. 6. What if there is a serious reaction? What should I look for? Look for anything that concerns you, such as signs of a severe allergic reaction, very high fever, or behavior changes. Signs of a severe allergic reaction can include hives, swelling of the face and throat, difficulty breathing, a fast heartbeat, dizziness, and weakness. These would start a few minutes to a few hours after the vaccination. What should I do?  If you think it is a severe allergic reaction or other emergency that can't wait, call 9-1-1 or get the person to the nearest hospital. Otherwise, call your doctor.  Afterward, the reaction should be reported to the Vaccine Adverse Event Reporting System (VAERS). Your doctor might file this report, or you can do it yourself through the VAERS web site at www.vaers.SamedayNews.es, or by calling (630) 368-5699. ? VAERS is only for reporting reactions. They do not give medical advice. 7. The National Vaccine Injury Compensation Program The [employer] Vaccine Injury Compensation Program (VICP) is a federal program that was created to compensate people who may have been injured by certain vaccines. Persons who believe they may have been injured by a vaccine can learn about the program and about filing a claim by calling 580-836-3323 or visiting the [employer] website at GoldCloset.com.ee. 8. How can I learn more?  Ask your doctor.  Call your local or state health department.  Contact the Centers for Disease Control and Prevention (CDC): ? Call (765)656-7088 (1-800-CDC-INFO) or ? Visit CDC's website at http://hunter.com/ CDC DTaP Vaccine (Diphtheria, Tetanus, and Pertussis) VIS (12/05/05) This information is not intended to replace advice  given to you by your health care provider. Make sure you discuss any questions you have with your health care provider. Document Released: 05/05/2006 Document Revised: 03/28/2016 Document Reviewed: 03/28/2016 Elsevier Interactive Patient Education  2017 [employer].

## 2017-04-23 NOTE — Progress Notes (Signed)
Date:  04/23/2017   Name:  Crystal Kent   DOB:  1946-03-28   MRN:  [id number]  PCP:  Adline Potter, MD    Chief Complaint: Mass (Knot or bump on forehead x 2 months that is growing in size. Hurts only when touched. )   History of Present Illness:  This is a 71 y.o. female seen for three month f/u. Pneumonia sxs resolved, sleeping better on Remeron. Has lesion over R eye past two months getting bigger. Requests flu/Tdap.  Review of Systems:  Review of Systems  Constitutional: Negative for chills and fever.  Respiratory: Negative for cough and shortness of breath.   Cardiovascular: Negative for chest pain and leg swelling.  Genitourinary: Negative for difficulty urinating.  Neurological: Negative for syncope and light-headedness.    Patient Active Problem List   Diagnosis Date Noted  . 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 Hypertension 06/28/2016  . Allergic rhinitis 06/28/2016  . Current use of proton pump inhibitor 06/28/2016  . 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 Chronic pansinusitis 02/13/2016  . Eustachian tube dysfunction 12/14/2014  . Hearing loss 12/14/2014  . Lesion of buccal mucosa 12/14/2014  . Dysphagia, oropharyngeal 05/23/2014  . Hypothyroidism 04/27/2014  . 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 Authorizing Provider  amLODipine (NORVASC) 5 MG tablet Take 1 tablet (5 mg total) by mouth daily. 12/27/16  Yes Minas Bonser, Gwyndolyn Saxon, MD  fluticasone (FLONASE) 50 MCG/ACT nasal spray Place 2 sprays into both nostrils daily as needed. 04/27/13  Yes [provider]  levothyroxine (SYNTHROID, LEVOTHROID) 75 MCG tablet Take 1 tablet (75 mcg total) by mouth daily. 12/27/16  Yes Adolph Clutter, Gwyndolyn Saxon, MD  mirtazapine (REMERON) 15 MG tablet Take 1 tablet (15 mg total) by  mouth at bedtime. 01/23/17  Yes Trammell Bowden, Gwyndolyn Saxon, MD  nystatin cream (MYCOSTATIN) once daily as needed.  05/02/16  Yes [provider]  omeprazole (PRILOSEC) 20 MG capsule Take 1 capsule (20 mg total) by mouth daily. 12/27/16  Yes Taima Rada, Gwyndolyn Saxon, MD    No Known Allergies  Past Surgical History:  Procedure Laterality Date  . ABDOMINAL HYSTERECTOMY    . BREAST BIOPSY Left    bx/clip-neg  . MOUTH SURGERY     surgery after cancer  . MYRINGOTOMY WITH TUBE PLACEMENT Right 02/27/2017   Procedure: MYRINGOTOMY WITH TUBE PLACEMENT Right ear.;  Surgeon: Margaretha Sheffield, MD;  Location: [place];  Service: ENT;  Laterality: Right;    Social History  Substance Use Topics  . Smoking status: Never Smoker  . Smokeless tobacco: Never Used  . Alcohol use No    Family History  Problem Relation Age of Onset  . Alzheimer's disease Mother   . Heart attack Father     Medication list has been reviewed and updated.  Physical Examination: BP 138/80   Pulse 84   Temp 97.6 F (36.4 C)   Resp 16   Ht 4\' 11"  (1.499 m)   Wt 104 lb 1.6 oz (47.2 kg)   SpO2 96%   BMI 21.03 kg/m   Physical Exam  Constitutional: She appears well-developed and well-nourished.  Cardiovascular: Normal rate, regular rhythm and normal heart sounds.   Pulmonary/Chest: Effort normal.  Scattered rhonchi  Musculoskeletal: She exhibits no edema.  Neurological: She is alert.  Skin: Skin is dry.  Symmetric  mobile cystic lesion over R eye  Psychiatric: She has a normal mood and affect. Her behavior is normal.  Nursing note and vitals reviewed.   Assessment and Plan:  1. Malignant neoplasm of hard palate (HCC) Followed by ENT  2. Sebaceous cyst Likely benign, pt can ask derm or ENT to remove at next visit  3. Essential hypertension Well controlled on amlodipine  4. Insomnia, unspecified type Improved on Remeron, avoid Tylenol PM  5. Need for diphtheria-tetanus-pertussis (Tdap) vaccine - Tdap vaccine  greater than or equal to 7yo IM  6. Need for influenza vaccination - Flu vaccine HIGH DOSE PF (Fluzone High dose)  Return in about 3 months (around 07/24/2017).  Crystal Kent. Crystal Kent, [facility]  04/23/2017

## 2017-04-24 ENCOUNTER — Ambulatory Visit: Payer: Medicare HMO | Admitting: Family Medicine

## 2017-04-25 ENCOUNTER — Telehealth: Payer: Self-pay | Admitting: Family Medicine

## 2017-04-25 ENCOUNTER — Other Ambulatory Visit: Payer: Self-pay | Admitting: Family Medicine

## 2017-04-25 DIAGNOSIS — Z931 Gastrostomy status: Secondary | ICD-10-CM

## 2017-04-25 NOTE — Telephone Encounter (Signed)
OK referral sent

## 2017-04-25 NOTE — Telephone Encounter (Signed)
Patient seen with [place] since getting tube but does the switching at home themselves 20 years. They want to see GI in [place] to help with the tubing.

## 2017-04-25 NOTE — Telephone Encounter (Signed)
Husband called and would like to be referred to a Dr Lucilla Lame in reference to the tube.

## 2017-05-19 ENCOUNTER — Other Ambulatory Visit: Payer: Self-pay

## 2017-05-19 ENCOUNTER — Ambulatory Visit (INDEPENDENT_AMBULATORY_CARE_PROVIDER_SITE_OTHER): Payer: Medicare HMO | Admitting: Gastroenterology

## 2017-05-19 ENCOUNTER — Encounter: Payer: Self-pay | Admitting: Gastroenterology

## 2017-05-19 DIAGNOSIS — K9423 Gastrostomy malfunction: Secondary | ICD-10-CM

## 2017-05-19 NOTE — Progress Notes (Signed)
Gastroenterology Consultation  Referring Provider:     Adline Potter, MD Primary Care Physician:  Adline Potter, MD Primary Gastroenterologist:  Dr. Allen Norris     Reason for Consultation:     PEG tube dysfunction        HPI:   Crystal Kent is a 71 y.o. y/o female referred for consultation & management of PEG tube dysfunction by Dr. Vicente Masson, Gwyndolyn Saxon, MD.  This patient comes today with a history of having a PEG tube for the last 20 years due to mouth cancer.  The patient's husband states that the patient had a PEG tube that he thought was sewn in and lasted for many years but when he had it changed at [facility] they put in a balloon-type replacement PEG and he reports that it only lasts approximately 3 months and then he has to change it.  The patient has board PEG tubes over the Internet and has been changing them himself.  The patient would like a low profile PEG replacement and is now here for evaluation.  Past Medical History:  Diagnosis Date  . Acid reflux   . Cancer ([place]) 1998   mouth ca-chemo/rad  . Difficult intubation    Pt reports "small airway"  . Hypertension   . Thyroid disease   . Uses prosthesis    plate to cover hole in roof of mouth s/p cancer surgery    Past Surgical History:  Procedure Laterality Date  . ABDOMINAL HYSTERECTOMY    . BREAST BIOPSY Left    bx/clip-neg  . MOUTH SURGERY     surgery after cancer  . MYRINGOTOMY WITH TUBE PLACEMENT Right 02/27/2017   Procedure: MYRINGOTOMY WITH TUBE PLACEMENT Right ear.;  Surgeon: Margaretha Sheffield, MD;  Location: [place];  Service: ENT;  Laterality: Right;    Prior to Admission medications   Medication Sig Start Date End Date Taking? Authorizing Provider  amLODipine (NORVASC) 5 MG tablet Take 1 tablet (5 mg total) by mouth daily. 12/27/16   Plonk, Gwyndolyn Saxon, MD  fluticasone (FLONASE) 50 MCG/ACT nasal spray Place 2 sprays into both nostrils daily as needed. 04/27/13   [provider]  levothyroxine  (SYNTHROID, LEVOTHROID) 75 MCG tablet Take 1 tablet (75 mcg total) by mouth daily. 12/27/16   Plonk, Gwyndolyn Saxon, MD  mirtazapine (REMERON) 15 MG tablet Take 1 tablet (15 mg total) by mouth at bedtime. 01/23/17   Plonk, Gwyndolyn Saxon, MD  nystatin cream (MYCOSTATIN) once daily as needed.  05/02/16   [provider]  omeprazole (PRILOSEC) 20 MG capsule Take 1 capsule (20 mg total) by mouth daily. 12/27/16   Adline Potter, MD    Family History  Problem Relation Age of Onset  . Alzheimer's disease Mother   . Heart attack Father      Social History  Substance Use Topics  . Smoking status: Never Smoker  . Smokeless tobacco: Never Used  . Alcohol use No    Allergies as of 05/19/2017  . (No Known Allergies)    Review of Systems:    All systems reviewed and negative except where noted in HPI.   Physical Exam:  There were no vitals taken for this visit. No LMP recorded. Patient has had a hysterectomy. Psych:  Alert and cooperative. Normal mood and affect. General:   Alert,  Well-developed, well-nourished, pleasant and cooperative in NAD Head:  Normocephalic and atraumatic. Eyes:  Sclera clear, no icterus.   Conjunctiva pink. Ears:  Normal auditory acuity. Nose:  No deformity, discharge,  or lesions. Mouth:  Scars in the right side of face from resection of her mouth cancer. Neck:  Supple; no masses or thyromegaly. Abdomen:  Soft nontender nondistended with a PEG tube in place. RNeurologic:  Alert and oriented x3;  grossly normal neurologically. Skin:  Intact without significant lesions or rashes.  No jaundice. Psych:  Alert and cooperative. Normal mood and affect.  Imaging Studies: No results found.  Assessment and Plan:   Crystal Kent is a 71 y.o. y/o female Who has a history of mouth cancer and is fed through a PEG tube.  The patient's PEG tube appears warn and needs to be replaced.  The patient will be set up for replacement PEG tube with a Mic-Key low profile PEG tube.   The patient has been explained the plan and agrees with it.  Lucilla Lame, MD. Marval Regal   Note: This dictation was prepared with Dragon dictation along with smaller phrase technology. Any transcriptional errors that result from this process are unintentional.

## 2017-05-26 ENCOUNTER — Encounter: Payer: Self-pay | Admitting: *Deleted

## 2017-05-27 ENCOUNTER — Ambulatory Visit
Admission: RE | Admit: 2017-05-27 | Discharge: 2017-05-27 | Disposition: A | Discharge status: Home / Self Care | Payer: Medicare HMO | Source: Ambulatory Visit | Attending: Gastroenterology | Admitting: Gastroenterology

## 2017-05-27 ENCOUNTER — Encounter: Payer: Self-pay | Admitting: Anesthesiology

## 2017-05-27 ENCOUNTER — Encounter
Admission: RE | Disposition: A | Discharge status: Home / Self Care | Payer: Self-pay | Source: Ambulatory Visit | Attending: Gastroenterology

## 2017-05-27 DIAGNOSIS — Z79899 Other long term (current) drug therapy: Secondary | ICD-10-CM | POA: Insufficient documentation

## 2017-05-27 DIAGNOSIS — Y833 Surgical operation with formation of external stoma as the cause of abnormal reaction of the patient, or of later complication, without mention of misadventure at the time of the procedure: Secondary | ICD-10-CM | POA: Insufficient documentation

## 2017-05-27 DIAGNOSIS — K219 Gastro-esophageal reflux disease without esophagitis: Secondary | ICD-10-CM | POA: Insufficient documentation

## 2017-05-27 DIAGNOSIS — Z79890 Hormone replacement therapy (postmenopausal): Secondary | ICD-10-CM | POA: Insufficient documentation

## 2017-05-27 DIAGNOSIS — Z9221 Personal history of antineoplastic chemotherapy: Secondary | ICD-10-CM | POA: Insufficient documentation

## 2017-05-27 DIAGNOSIS — K9423 Gastrostomy malfunction: Secondary | ICD-10-CM | POA: Diagnosis not present

## 2017-05-27 DIAGNOSIS — Z7951 Long term (current) use of inhaled steroids: Secondary | ICD-10-CM | POA: Diagnosis not present

## 2017-05-27 DIAGNOSIS — Z923 Personal history of irradiation: Secondary | ICD-10-CM | POA: Diagnosis not present

## 2017-05-27 DIAGNOSIS — E079 Disorder of thyroid, unspecified: Secondary | ICD-10-CM | POA: Insufficient documentation

## 2017-05-27 DIAGNOSIS — Z8589 Personal history of malignant neoplasm of other organs and systems: Secondary | ICD-10-CM | POA: Diagnosis not present

## 2017-05-27 DIAGNOSIS — I10 Essential (primary) hypertension: Secondary | ICD-10-CM | POA: Insufficient documentation

## 2017-05-27 HISTORY — PX: PEG PLACEMENT: SHX5437

## 2017-05-27 SURGERY — INSERTION, PEG TUBE

## 2017-05-27 NOTE — Procedures (Signed)
The patient came in for a dysfunctioning tube. The tube was removed and a low profile 20 French 2 cm replacement was inserted.`

## 2017-05-27 NOTE — H&P (Signed)
Crystal Lame, MD [facility] [address] [place], [place] [address] Phone:[phone] Fax : [phone]  Primary Care Physician:  Adline Potter, MD Primary Gastroenterologist:  Dr. Allen Norris  Pre-Procedure History & Physical: HPI:  Crystal Kent is a 71 y.o. female is here for an bedside PEG change.   Past Medical History:  Diagnosis Date  . Acid reflux   . 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 Difficult intubation    Pt reports "small airway"  . Hypertension   . Thyroid disease   . Uses prosthesis    plate to cover hole in roof of mouth s/p cancer surgery    Past Surgical History:  Procedure Laterality Date  . ABDOMINAL HYSTERECTOMY    . BREAST BIOPSY Left    bx/clip-neg  . MOUTH SURGERY     surgery after cancer    Prior to Admission medications   Medication Sig Start Date End Date Taking? Authorizing Provider  amLODipine (NORVASC) 5 MG tablet Take 1 tablet (5 mg total) by mouth daily. 12/27/16   Plonk, Gwyndolyn Saxon, MD  fluticasone (FLONASE) 50 MCG/ACT nasal spray Place 2 sprays into both nostrils daily as needed. 04/27/13   [provider]  levothyroxine (SYNTHROID, LEVOTHROID) 75 MCG tablet Take 1 tablet (75 mcg total) by mouth daily. 12/27/16   Plonk, Gwyndolyn Saxon, MD  mirtazapine (REMERON) 15 MG tablet Take 1 tablet (15 mg total) by mouth at bedtime. 01/23/17   Plonk, Gwyndolyn Saxon, MD  nystatin cream (MYCOSTATIN) once daily as needed.  05/02/16   [provider]  omeprazole (PRILOSEC) 20 MG capsule Take 1 capsule (20 mg total) by mouth daily. 12/27/16   Adline Potter, MD    Allergies as of 05/20/2017  . (No Known Allergies)    Family History  Problem Relation Age of Onset  . Alzheimer's disease Mother   . Heart attack Father     Social History   Socioeconomic History  . Marital status: Married    Spouse name: Not on file  . Number of children: Not on file  . Years of education: Not on file  . Highest education level: Not on file    Social Needs  . Financial resource strain: Not on file  . Food insecurity - worry: Not on file  . Food insecurity - inability: Not on file  . Transportation needs - medical: Not on file  . Transportation needs - non-medical: Not on file  Occupational History  . Not on file  Tobacco Use  . Smoking status: Never Smoker  . Smokeless tobacco: Never Used  Substance and Sexual Activity  . Alcohol use: No  . Drug use: No  . Sexual activity: Not on file  Other Topics Concern  . Not on file  Social History Narrative  . Not on file    Review of Systems: See HPI, otherwise negative ROS  Physical Exam: There were no vitals taken for this visit. General:   Alert,  pleasant and cooperative in NAD Head:  Normocephalic and atraumatic. Neck:  Supple; no masses or thyromegaly. Lungs:  Clear throughout to auscultation.    Heart:  Regular rate and rhythm. Abdomen:  Soft, nontender and nondistended. Normal bowel sounds, without guarding, and without rebound.   Neurologic:  Alert and  oriented x4;  grossly normal neurologically.  Impression/Plan: Crystal Kent is here for an bedside PEG change to be performed for dysfunctional PEG  Risks, benefits, limitations, and alternatives regarding  bedside PEG changeave been reviewed with  the patient.  Questions have been answered.  All parties agreeable.   Crystal Lame, MD  05/27/2017, 11:28 AM

## 2017-05-28 ENCOUNTER — Encounter: Payer: Self-pay | Admitting: Gastroenterology

## 2017-06-24 ENCOUNTER — Ambulatory Visit: Payer: Medicare HMO | Admitting: Family Medicine

## 2017-06-24 ENCOUNTER — Encounter: Payer: Self-pay | Admitting: Family Medicine

## 2017-06-24 VITALS — BP 122/82 | HR 80 | Temp 98.7°F | Resp 16 | Ht 59.0 in | Wt 102.0 lb

## 2017-06-24 DIAGNOSIS — G4700 Insomnia, unspecified: Secondary | ICD-10-CM | POA: Diagnosis not present

## 2017-06-24 DIAGNOSIS — J40 Bronchitis, not specified as acute or chronic: Secondary | ICD-10-CM

## 2017-06-24 DIAGNOSIS — C050 Malignant neoplasm of hard palate: Secondary | ICD-10-CM | POA: Diagnosis not present

## 2017-06-24 DIAGNOSIS — I10 Essential (primary) hypertension: Secondary | ICD-10-CM

## 2017-06-24 DIAGNOSIS — Z931 Gastrostomy status: Secondary | ICD-10-CM | POA: Diagnosis not present

## 2017-06-24 DIAGNOSIS — E039 Hypothyroidism, unspecified: Secondary | ICD-10-CM

## 2017-06-24 MED ORDER — DOXYCYCLINE HYCLATE 100 MG PO TABS: 100.0000 mg | ORAL_TABLET | Freq: Two times a day (BID) | ORAL | 0 refills | Status: DC

## 2017-06-24 NOTE — Progress Notes (Signed)
Date:  06/24/2017   Name:  Crystal Kent   DOB:  1946/04/04   MRN:  [id number]  PCP:  Adline Potter, MD    Chief Complaint: Cough (cough, post nasal drip,headache, and chills 4 weeks. )   History of Present Illness:  This is a 71 y.o. female seen for one month hx of NP cough, postnasal drip, and nocturnal chills, not improving. Feels Zpak ineffective in past. HTN controlled on amlopdipine, insomnia improved on Remeron. Saw GI for PEG tube replacement.   Review of Systems:  Review of Systems  Constitutional: Negative for fever.  Respiratory: Negative for shortness of breath.   Cardiovascular: Negative for chest pain and leg swelling.  Gastrointestinal: Negative for abdominal pain.  Genitourinary: Negative for difficulty urinating.  Neurological: Negative for syncope and light-headedness.    Patient Active Problem List   Diagnosis Date Noted  . Insomnia 04/23/2017  . 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 Hypertension 06/28/2016  . Allergic rhinitis 06/28/2016  . Current use of proton pump inhibitor 06/28/2016  . 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 Chronic pansinusitis 02/13/2016  . Eustachian tube dysfunction 12/14/2014  . Hearing loss 12/14/2014  . Lesion of buccal mucosa 12/14/2014  . Dysphagia, oropharyngeal 05/23/2014  . Hypothyroidism 04/27/2014  . 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 Authorizing Provider  acetaminophen (TYLENOL) 325 MG tablet Take 650 mg by mouth every 6 (six) hours as needed.   Yes [provider]  amLODipine (NORVASC) 5 MG tablet Take 1 tablet (5 mg total) by mouth daily. 12/27/16  Yes Myrakle Wingler, Gwyndolyn Saxon, MD  DM-Doxylamine-Acetaminophen (NYQUIL COLD & FLU PO) Take by mouth.   Yes [provider]  fluticasone (FLONASE) 50  MCG/ACT nasal spray Place 2 sprays into both nostrils daily as needed. 04/27/13  Yes [provider]  levothyroxine (SYNTHROID, LEVOTHROID) 75 MCG tablet Take 1 tablet (75 mcg total) by mouth daily. 12/27/16  Yes Lariya Kinzie, Gwyndolyn Saxon, MD  mirtazapine (REMERON) 15 MG tablet Take 1 tablet (15 mg total) by mouth at bedtime. 01/23/17  Yes Jaunice Mirza, Gwyndolyn Saxon, MD  nystatin cream (MYCOSTATIN) once daily as needed.  05/02/16  Yes [provider]  omeprazole (PRILOSEC) 20 MG capsule Take 1 capsule (20 mg total) by mouth daily. 12/27/16  Yes Kaseem Vastine, Gwyndolyn Saxon, MD  doxycycline (VIBRA-TABS) 100 MG tablet Take 1 tablet (100 mg total) by mouth 2 (two) times daily. 06/24/17   Adline Potter, MD    No Known Allergies  Past Surgical History:  Procedure Laterality Date  . ABDOMINAL HYSTERECTOMY    . BREAST BIOPSY Left    bx/clip-neg  . MOUTH SURGERY     surgery after cancer  . MYRINGOTOMY WITH TUBE PLACEMENT Right 02/27/2017   Procedure: MYRINGOTOMY WITH TUBE PLACEMENT Right ear.;  Surgeon: Margaretha Sheffield, MD;  Location: [place];  Service: ENT;  Laterality: Right;  . PEG PLACEMENT N/A 05/27/2017   Procedure: PERCUTANEOUS ENDOSCOPIC GASTROSTOMY (PEG) PLACEMENT;  Surgeon: Lucilla Lame, MD;  Location: [facility] ENDOSCOPY;  Service: Endoscopy;  Laterality: N/A;    Social History   Tobacco Use  . Smoking status: Never Smoker  . Smokeless tobacco: Never Used  Substance Use Topics  . Alcohol use: No  . Drug use: No    Family History  Problem Relation Age of Onset  . Alzheimer's disease Mother   .  Heart attack Father     Medication list has been reviewed and updated.  Physical Examination: BP 122/82   Pulse 80   Temp 98.7 F (37.1 C)   Resp 16   Ht 4\' 11"  (1.499 m)   Wt 102 lb (46.3 kg)   SpO2 96%   BMI 20.60 kg/m   Physical Exam  Constitutional: She appears well-developed and well-nourished.  HENT:  Right Ear: External ear normal.  Left Ear: External ear normal.  Nose: Nose normal.   Mouth/Throat: Oropharynx is clear and moist.  TMs clear Mild B max sinus tenderness  Cardiovascular: Normal rate, regular rhythm and normal heart sounds.  Pulmonary/Chest: Effort normal.  Scattered rhonchi  Musculoskeletal: She exhibits no edema.  Lymphadenopathy:    She has no cervical adenopathy.  Neurological: She is alert.  Skin: Skin is warm and dry.  Psychiatric: She has a normal mood and affect. Her behavior is normal.  Nursing note and vitals reviewed.   Assessment and Plan:  1. Bronchitis, persistent Doxy x 7d  2. Essential hypertension Well controlled on amlodipine  3. Malignant neoplasm of hard palate (HCC) Followed by ENT  4. Gastrostomy in place [facility]) Followed by GI  5. Insomnia, unspecified type Improved on Remeron  6. Hypothyroidism, unspecified type Well controlled on Synthroid last year, consider TSH next visit  Return if symptoms worsen or fail to improve.  [name]. [facility]  06/24/2017

## 2017-06-27 ENCOUNTER — Telehealth: Payer: Self-pay

## 2017-06-27 NOTE — Telephone Encounter (Signed)
Husband called stating Abx not working. Advised that it has not been long enough and that they can work 10 days after finished. If wheezing or fever can go to [place] or ER but as of now fluids and rest and finish ALL abx. Will check on when we open again.  Explained this Plonk is on call and they can reach him by calling nurse line as well.

## 2017-06-27 NOTE — Telephone Encounter (Signed)
OK consider change to Levaquin if no improvement by Monday

## 2017-07-24 ENCOUNTER — Encounter: Payer: Self-pay | Admitting: Family Medicine

## 2017-07-24 ENCOUNTER — Ambulatory Visit (INDEPENDENT_AMBULATORY_CARE_PROVIDER_SITE_OTHER): Payer: Medicare HMO | Admitting: Family Medicine

## 2017-07-24 VITALS — BP 97/78 | HR 77 | Temp 98.4°F | Resp 16 | Ht 59.0 in | Wt 101.8 lb

## 2017-07-24 DIAGNOSIS — M810 Age-related osteoporosis without current pathological fracture: Secondary | ICD-10-CM | POA: Diagnosis not present

## 2017-07-24 DIAGNOSIS — Z79899 Other long term (current) drug therapy: Secondary | ICD-10-CM

## 2017-07-24 DIAGNOSIS — E039 Hypothyroidism, unspecified: Secondary | ICD-10-CM

## 2017-07-24 DIAGNOSIS — I10 Essential (primary) hypertension: Secondary | ICD-10-CM

## 2017-07-24 DIAGNOSIS — J0141 Acute recurrent pansinusitis: Secondary | ICD-10-CM | POA: Diagnosis not present

## 2017-07-24 DIAGNOSIS — C01 Malignant neoplasm of base of tongue: Secondary | ICD-10-CM | POA: Diagnosis not present

## 2017-07-24 MED ORDER — AMOXICILLIN-POT CLAVULANATE 875-125 MG PO TABS
1.0000 | ORAL_TABLET | Freq: Two times a day (BID) | ORAL | 0 refills | Status: DC
Start: 2017-07-24 — End: 2017-11-11

## 2017-07-24 NOTE — Progress Notes (Signed)
Date:  07/24/2017   Name:  Crystal Kent   DOB:  1945-12-25   MRN:  [id number]  PCP:  Adline Potter, MD    Chief Complaint: Sinus Problem (headache and sinus pressure has been going on since last OV. bodyache and chills also. )   History of Present Illness:  This is a 72 y.o. female seen in one month f/u. Rx'd with doxy for bronchitis last visit, did not help, now c/o frontal and max sinus pain with chills over past two weeks, clear rhinorrhea, some malaise and L sided neck pain, minimal cough. Due for blood work this visit.  Review of Systems:  Review of Systems  Constitutional: Negative for fever.  HENT: Negative for ear pain.   Eyes: Negative for pain.  Respiratory: Negative for shortness of breath.   Cardiovascular: Negative for chest pain and leg swelling.  Genitourinary: Negative for difficulty urinating.  Neurological: Negative for syncope and light-headedness.  Hematological: Negative for adenopathy.    Patient Active Problem List   Diagnosis Date Noted  . Insomnia 04/23/2017  . 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 Hypertension 06/28/2016  . Allergic rhinitis 06/28/2016  . Current use of proton pump inhibitor 06/28/2016  . 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 Chronic pansinusitis 02/13/2016  . Eustachian tube dysfunction 12/14/2014  . Hearing loss 12/14/2014  . Lesion of buccal mucosa 12/14/2014  . Dysphagia, oropharyngeal 05/23/2014  . Hypothyroidism 04/27/2014  . 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 Authorizing Provider  acetaminophen (TYLENOL) 325 MG tablet Take 650 mg by mouth every 6 (six) hours as needed.   Yes [provider]  amLODipine (NORVASC) 5 MG tablet Take 1 tablet (5 mg total) by mouth daily. 12/27/16  Yes Infant Zink, Gwyndolyn Saxon, MD   fluticasone (FLONASE) 50 MCG/ACT nasal spray Place 2 sprays into both nostrils daily as needed. 04/27/13  Yes [provider]  levothyroxine (SYNTHROID, LEVOTHROID) 75 MCG tablet Take 1 tablet (75 mcg total) by mouth daily. 12/27/16  Yes Abryanna Musolino, Gwyndolyn Saxon, MD  nystatin cream (MYCOSTATIN) once daily as needed.  05/02/16  Yes [provider]  omeprazole (PRILOSEC) 20 MG capsule Take 1 capsule (20 mg total) by mouth daily. 12/27/16  Yes Beaux Wedemeyer, Gwyndolyn Saxon, MD  amoxicillin-clavulanate (AUGMENTIN) 875-125 MG tablet Take 1 tablet by mouth 2 (two) times daily. 07/24/17   Adline Potter, MD    No Known Allergies  Past Surgical History:  Procedure Laterality Date  . ABDOMINAL HYSTERECTOMY    . BREAST BIOPSY Left    bx/clip-neg  . MOUTH SURGERY     surgery after cancer  . MYRINGOTOMY WITH TUBE PLACEMENT Right 02/27/2017   Procedure: MYRINGOTOMY WITH TUBE PLACEMENT Right ear.;  Surgeon: Margaretha Sheffield, MD;  Location: [place];  Service: ENT;  Laterality: Right;  . PEG PLACEMENT N/A 05/27/2017   Procedure: PERCUTANEOUS ENDOSCOPIC GASTROSTOMY (PEG) PLACEMENT;  Surgeon: Lucilla Lame, MD;  Location: [facility] END[facility];  Service: Endoscopy;  Laterality: N/A;    Social History   Tobacco Use  . Smoking status: Never Smoker  . Smokeless tobacco: Never Used  Substance Use Topics  . Alcohol use: No  . Drug use: No    Family History  Problem Relation Age of Onset  . Alzheimer's disease Mother   . Heart attack Father     Medication list  has been reviewed and updated.  Physical Examination: BP 97/78   Pulse 77   Temp 98.4 F (36.9 C)   Resp 16   Ht 4\' 11"  (1.499 m)   Wt 101 lb 12.8 oz (46.2 kg)   SpO2 94%   BMI 20.56 kg/m   Physical Exam  Constitutional: She appears well-developed and well-nourished.  HENT:  Left Ear: External ear normal.  Mouth/Throat: Oropharynx is clear and moist.  Cerumen R ear B frontal and max sinus tenderness  Neck: Neck supple. No thyromegaly  present.  Cardiovascular: Normal rate, regular rhythm and normal heart sounds.  Pulmonary/Chest: Effort normal and breath sounds normal.  Musculoskeletal: She exhibits no edema.  Lymphadenopathy:    She has no cervical adenopathy.  Neurological: She is alert.  Skin: Skin is warm and dry.  Psychiatric: She has a normal mood and affect. Her behavior is normal.  Nursing note and vitals reviewed.   Assessment and Plan:  1. Acute recurrent pansinusitis Augmentin 875 mg bid x 10d, call if sxs worsen/persist  2. Malignant neoplasm of base of tongue ([place]) Followed by [facility] ENT  3. Essential hypertension Overcontrolled today, well controlled last visit - Comprehensive Metabolic Panel (CMET) - CBC  4. Hypothyroidism, unspecified type On Synthroid - TSH  5. Osteoporosis, post-menopausal - Vitamin D (25 hydroxy)  6. Current use of proton pump inhibitor - B12  Return in about 6 months (around 01/21/2018).  [name]. [facility]  07/24/2017

## 2017-07-25 LAB — COMPREHENSIVE METABOLIC PANEL
A/G RATIO: 1.7 (ref 1.2–2.2)
ALT: 20 IU/L (ref 0–32)
AST: 26 IU/L (ref 0–40)
Albumin: 4.6 g/dL (ref 3.5–4.8)
Alkaline Phosphatase: 115 IU/L (ref 39–117)
BUN/Creatinine Ratio: 26 (ref 12–28)
BUN: 18 mg/dL (ref 8–27)
Bilirubin Total: 0.4 mg/dL (ref 0.0–1.2)
CALCIUM: 10.1 mg/dL (ref 8.7–10.3)
CO2: 25 mmol/L (ref 20–29)
CREATININE: 0.68 mg/dL (ref 0.57–1.00)
Chloride: 94 mmol/L — ABNORMAL LOW (ref 96–106)
GFR calc Af Amer: 101 mL/min/{1.73_m2} (ref >59)
GFR, EST NON AFRICAN AMERICAN: 88 mL/min/{1.73_m2} (ref >59)
Globulin, Total: 2.7 g/dL (ref 1.5–4.5)
Glucose: 96 mg/dL (ref 65–99)
Potassium: 4.8 mmol/L (ref 3.5–5.2)
Sodium: 135 mmol/L (ref 134–144)
TOTAL PROTEIN: 7.3 g/dL (ref 6.0–8.5)

## 2017-07-25 LAB — CBC
HEMATOCRIT: 36.6 % (ref 34.0–46.6)
HEMOGLOBIN: 12.5 g/dL (ref 11.1–15.9)
MCH: 31.3 pg (ref 26.6–33.0)
MCHC: 34.2 g/dL (ref 31.5–35.7)
MCV: 92 fL (ref 79–97)
Platelets: 212 10*3/uL (ref 150–379)
RBC: 4 x10E6/uL (ref 3.77–5.28)
RDW: 12.6 % (ref 12.3–15.4)
WBC: 3.3 10*3/uL — ABNORMAL LOW (ref 3.4–10.8)

## 2017-07-25 LAB — VITAMIN B12: Vitamin B-12: 834 pg/mL (ref 232–1245)

## 2017-07-25 LAB — VITAMIN D 25 HYDROXY (VIT D DEFICIENCY, FRACTURES): Vit D, 25-Hydroxy: 38.5 ng/mL (ref 30.0–100.0)

## 2017-07-25 LAB — TSH: TSH: 2.81 u[IU]/mL (ref 0.450–4.500)

## 2017-07-28 ENCOUNTER — Ambulatory Visit: Payer: Self-pay | Admitting: Family Medicine

## 2017-10-15 ENCOUNTER — Other Ambulatory Visit: Payer: Self-pay | Admitting: Family Medicine

## 2017-11-11 ENCOUNTER — Encounter: Payer: Self-pay | Admitting: Family Medicine

## 2017-11-11 ENCOUNTER — Ambulatory Visit: Payer: Medicare HMO | Admitting: Family Medicine

## 2017-11-11 VITALS — BP 114/79 | HR 79 | Temp 97.9°F | Resp 16 | Ht 59.0 in | Wt 101.4 lb

## 2017-11-11 DIAGNOSIS — E039 Hypothyroidism, unspecified: Secondary | ICD-10-CM | POA: Diagnosis not present

## 2017-11-11 DIAGNOSIS — J0101 Acute recurrent maxillary sinusitis: Secondary | ICD-10-CM | POA: Diagnosis not present

## 2017-11-11 DIAGNOSIS — I10 Essential (primary) hypertension: Secondary | ICD-10-CM | POA: Diagnosis not present

## 2017-11-11 DIAGNOSIS — C050 Malignant neoplasm of hard palate: Secondary | ICD-10-CM

## 2017-11-11 DIAGNOSIS — J329 Chronic sinusitis, unspecified: Secondary | ICD-10-CM | POA: Insufficient documentation

## 2017-11-11 MED ORDER — AMOXICILLIN-POT CLAVULANATE 875-125 MG PO TABS: 1.0000 | ORAL_TABLET | Freq: Two times a day (BID) | ORAL | 0 refills | Status: DC

## 2017-11-11 NOTE — Progress Notes (Signed)
Date:  11/11/2017   Name:  Crystal Kent   DOB:  07-14-1946   MRN:  [id number]  PCP:  Adline Potter, MD    Chief Complaint: Cough (cough went away a few weeks ago and came back and now she lost her voice...)   History of Present Illness:  This is a 73 y.o. female seen same day for 3d hx sinus congestion and worsened cough. Hx recurrent sinusitis/bronchits, SCC hard palate, saw [facility] ENT earlier this month, treated for thrush. Otherwise doing well.  Review of Systems:  Review of Systems  Constitutional: Negative for chills and fever.  Respiratory: Negative for shortness of breath.   Cardiovascular: Negative for chest pain and leg swelling.  Genitourinary: Negative for difficulty urinating.  Neurological: Negative for syncope and light-headedness.    Patient Active Problem List   Diagnosis Date Noted  . Insomnia 04/23/2017  . 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 Hypertension 06/28/2016  . Allergic rhinitis 06/28/2016  . Current use of proton pump inhibitor 06/28/2016  . 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 Chronic pansinusitis 02/13/2016  . Eustachian tube dysfunction 12/14/2014  . Hearing loss 12/14/2014  . Lesion of buccal mucosa 12/14/2014  . Dysphagia, oropharyngeal 05/23/2014  . Hypothyroidism 04/27/2014  . 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 Authorizing Provider  acetaminophen (TYLENOL) 325 MG tablet Take 650 mg by mouth every 6 (six) hours as needed.   Yes [provider]  amLODipine (NORVASC) 5 MG tablet TAKE 1 TABLET EVERY DAY 10/16/17  Yes Karolyn Messing, Gwyndolyn Saxon, MD  fluticasone (FLONASE) 50 MCG/ACT nasal spray Place 2 sprays into both nostrils daily as needed. 04/27/13  Yes [provider]  levothyroxine (SYNTHROID, LEVOTHROID) 75 MCG tablet TAKE  1 TABLET EVERY DAY 10/16/17  Yes Trajan Grove, Gwyndolyn Saxon, MD  nystatin cream (MYCOSTATIN) once daily as needed.  05/02/16  Yes [provider]  omeprazole (PRILOSEC) 20 MG capsule Take 1 capsule (20 mg total) by mouth daily. 12/27/16  Yes Davin Archuletta, Gwyndolyn Saxon, MD  amoxicillin-clavulanate (AUGMENTIN) 875-125 MG tablet Take 1 tablet by mouth 2 (two) times daily. 11/11/17   Adline Potter, MD    No Known Allergies  Past Surgical History:  Procedure Laterality Date  . ABDOMINAL HYSTERECTOMY    . BREAST BIOPSY Left    bx/clip-neg  . MOUTH SURGERY     surgery after cancer  . MYRINGOTOMY WITH TUBE PLACEMENT Right 02/27/2017   Procedure: MYRINGOTOMY WITH TUBE PLACEMENT Right ear.;  Surgeon: Margaretha Sheffield, MD;  Location: [place];  Service: ENT;  Laterality: Right;  . PEG PLACEMENT N/A 05/27/2017   Procedure: PERCUTANEOUS ENDOSCOPIC GASTROSTOMY (PEG) PLACEMENT;  Surgeon: Lucilla Lame, MD;  Location: [facility] ENDOS[facility];  Service: Endoscopy;  Laterality: N/A;    Social History   Tobacco Use  . Smoking status: Never Smoker  . Smokeless tobacco: Never Used  Substance Use Topics  . Alcohol use: No  . Drug use: No    Family History  Problem Relation Age of Onset  . Alzheimer's disease Mother   . Heart attack Father     Medication list has been reviewed and updated.  Physical Examination: BP 114/79   Pulse 79   Temp 97.9 F (36.6 C) (Oral)   Resp 16   Ht 4\' 11"  (1.499 m)   Wt 101 lb 6.4 oz (  46 kg)   SpO2 97%   BMI 20.48 kg/m   Physical Exam  Constitutional: She appears well-developed and well-nourished.  Cardiovascular: Normal rate, regular rhythm and normal heart sounds.  Pulmonary/Chest: Effort normal.  Exp rhonchi R base  Musculoskeletal: She exhibits no edema.  Neurological: She is alert.  Skin: Skin is warm and dry.  Psychiatric: She has a normal mood and affect. Her behavior is normal.  Nursing note and vitals reviewed.   Assessment and Plan:  1. Acute recurrent  maxillary sinusitis Augmentin 875 bid x 10d, call if sxs worsen/persist  2. Malignant neoplasm of hard palate (HCC) Stable, followed by ENT  3. Essential hypertension Well controlled on amlodipine  4. Hypothyroidism, unspecified type Well controlled on Synthroid  Return in about 3 months (around 02/10/2018).  [name]. [facility]  11/11/2017

## 2017-11-18 ENCOUNTER — Encounter: Payer: Self-pay | Admitting: Internal Medicine

## 2017-11-18 ENCOUNTER — Ambulatory Visit (INDEPENDENT_AMBULATORY_CARE_PROVIDER_SITE_OTHER): Payer: Medicare HMO | Admitting: Internal Medicine

## 2017-11-18 ENCOUNTER — Ambulatory Visit
Admission: RE | Admit: 2017-11-18 | Discharge: 2017-11-18 | Disposition: A | Discharge status: Home / Self Care | Payer: Medicare HMO | Source: Ambulatory Visit | Attending: Internal Medicine | Admitting: Internal Medicine

## 2017-11-18 VITALS — BP 122/82 | HR 87 | Temp 98.7°F | Resp 16 | Ht 59.0 in | Wt 102.8 lb

## 2017-11-18 DIAGNOSIS — R918 Other nonspecific abnormal finding of lung field: Secondary | ICD-10-CM | POA: Diagnosis not present

## 2017-11-18 DIAGNOSIS — J181 Lobar pneumonia, unspecified organism: Secondary | ICD-10-CM

## 2017-11-18 DIAGNOSIS — R05 Cough: Secondary | ICD-10-CM | POA: Diagnosis not present

## 2017-11-18 DIAGNOSIS — J189 Pneumonia, unspecified organism: Secondary | ICD-10-CM

## 2017-11-18 DIAGNOSIS — I10 Essential (primary) hypertension: Secondary | ICD-10-CM | POA: Diagnosis not present

## 2017-11-18 DIAGNOSIS — Z931 Gastrostomy status: Secondary | ICD-10-CM | POA: Diagnosis not present

## 2017-11-18 MED ORDER — LEVOFLOXACIN 500 MG PO TABS: 500.0000 mg | ORAL_TABLET | Freq: Every day | ORAL | 0 refills | Status: DC

## 2017-11-18 MED ORDER — ALBUTEROL SULFATE HFA 108 (90 BASE) MCG/ACT IN AERS: 2.0000 | INHALATION_SPRAY | Freq: Four times a day (QID) | RESPIRATORY_TRACT | 0 refills | Status: DC | PRN

## 2017-11-18 NOTE — Patient Instructions (Addendum)
Get over the counter syrup -- Robitussin plain - take as instructed on the bottle

## 2017-11-18 NOTE — Progress Notes (Signed)
Date:  11/18/2017   Name:  Crystal Kent   DOB:  [birth year]   MRN:  [id number]   Chief Complaint: Fatigue and Cough (Has had cough and congest) Cough  This is a new problem. The current episode started 1 to 4 weeks ago. The problem has been gradually worsening. The problem occurs every few minutes. Associated symptoms include chills, shortness of breath and wheezing. Pertinent negatives include no chest pain, fever or headaches.  Seen last week for sinusitis and prescribed Augmentin.  She is still coughing with some wheezing now, along with chills.  No fevers.  Some diarrhea the last 2 days.  Taking Nyquil at night to help with sleep.     Review of Systems  Constitutional: Positive for chills. Negative for fever.  Respiratory: Positive for cough, shortness of breath and wheezing. Negative for chest tightness.   Cardiovascular: Negative for chest pain and palpitations.  Gastrointestinal: Positive for diarrhea. Negative for abdominal pain and vomiting.  Neurological: Negative for dizziness and headaches.    Patient Active Problem List   Diagnosis Date Noted  . Insomnia 04/23/2017  . 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 Hypertension 06/28/2016  . Allergic rhinitis 06/28/2016  . Current use of proton pump inhibitor 06/28/2016  . 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 Chronic pansinusitis 02/13/2016  . Eustachian tube dysfunction 12/14/2014  . Hearing loss 12/14/2014  . Lesion of buccal mucosa 12/14/2014  . Dysphagia, oropharyngeal 05/23/2014  . Hypothyroidism 04/27/2014  . 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 Authorizing Provider  acetaminophen (TYLENOL) 325 MG tablet Take 650 mg by mouth every 6 (six) hours as needed.   Yes [provider]  amLODipine  (NORVASC) 5 MG tablet TAKE 1 TABLET EVERY DAY 10/16/17  Yes Plonk, Gwyndolyn Saxon, MD  amoxicillin-clavulanate (AUGMENTIN) 875-125 MG tablet Take 1 tablet by mouth 2 (two) times daily. 11/11/17  Yes Plonk, Gwyndolyn Saxon, MD  fluticasone (FLONASE) 50 MCG/ACT nasal spray Place 2 sprays into both nostrils daily as needed. 04/27/13  Yes [provider]  levothyroxine (SYNTHROID, LEVOTHROID) 75 MCG tablet TAKE 1 TABLET EVERY DAY 10/16/17  Yes Plonk, Gwyndolyn Saxon, MD  nystatin cream (MYCOSTATIN) once daily as needed.  05/02/16  Yes [provider]  omeprazole (PRILOSEC) 20 MG capsule Take 1 capsule (20 mg total) by mouth daily. 12/27/16  Yes Plonk, Gwyndolyn Saxon, MD    No Known Allergies  Past Surgical History:  Procedure Laterality Date  . ABDOMINAL HYSTERECTOMY    . BREAST BIOPSY Left    bx/clip-neg  . hemimandibulectomy Right   . MYRINGOTOMY WITH TUBE PLACEMENT Right 02/27/2017   Procedure: MYRINGOTOMY WITH TUBE PLACEMENT Right ear.;  Surgeon: Margaretha Sheffield, MD;  Location: [place];  Service: ENT;  Laterality: Right;  . partial maxillectomy Right   . PEG PLACEMENT N/A 05/27/2017   Procedure: PERCUTANEOUS ENDOSCOPIC GASTROSTOMY (PEG) PLACEMENT;  Surgeon: Lucilla Lame, MD;  Location: [facility] ENDOS[facility];  Service: Endoscopy;  Laterality: N/A;  . 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 Smoking status: Never Smoker  . Smokeless tobacco: Never Used  Substance Use Topics  . Alcohol use: No  . Drug use: No     Medication list has been reviewed and updated.  PHQ 2/9 Scores  12/09/2016 12/09/2016 06/28/2016  PHQ - 2 Score 0 0 0  PHQ- 9 Score 0 - -    Physical Exam  Constitutional: She is oriented to person, place, and time. She appears well-developed. No distress.  HENT:  Head: Normocephalic.  Right Ear: Ear canal normal. Tympanic membrane is perforated (tube in place).  Left Ear: Tympanic membrane and ear canal normal.  Appliance in place,  right maxilla absent from previous surgery OP not examined - just seen by Oncology last week  Neck: Normal range of motion.  S/p right radical neck dissection  Cardiovascular: Normal rate, regular rhythm and normal heart sounds.  Pulmonary/Chest: Effort normal. No respiratory distress. She has wheezes in the right middle field and the right lower field. She has no rhonchi.  Musculoskeletal: Normal range of motion.  Neurological: She is alert and oriented to person, place, and time.  Skin: Skin is warm and dry. No rash noted.  Psychiatric: She has a normal mood and affect. Her behavior is normal. Thought content normal.    BP 122/82   Pulse 87   Temp 98.7 F (37.1 C)   Resp 16   Ht 4\' 11"  (1.499 m)   Wt 102 lb 12.8 oz (46.6 kg)   SpO2 97%   BMI 20.76 kg/m   Assessment and Plan: 1. Community acquired pneumonia of right middle lobe of lung ([place]) Change therapy, add robitussin q 6hours and albuterol MDI - levofloxacin (LEVAQUIN) 500 MG tablet; Take 1 tablet (500 mg total) by mouth daily.  Dispense: 7 tablet; Refill: 0 - albuterol (PROVENTIL HFA;VENTOLIN HFA) 108 (90 Base) MCG/ACT inhaler; Inhale 2 puffs into the lungs every 6 (six) hours as needed for wheezing or shortness of breath.  Dispense: 1 Inhaler; Refill: 0 - DG Chest 2 View; Future  2. Gastrostomy in place [facility]) Doing well - can crush Levaquin and flush down tube  3. Essential hypertension controlled   Meds ordered this encounter  Medications  . levofloxacin (LEVAQUIN) 500 MG tablet    Sig: Take 1 tablet (500 mg total) by mouth daily.    Dispense:  7 tablet    Refill:  0  . albuterol (PROVENTIL HFA;VENTOLIN HFA) 108 (90 Base) MCG/ACT inhaler    Sig: Inhale 2 puffs into the lungs every 6 (six) hours as needed for wheezing or shortness of breath.    Dispense:  1 Inhaler    Refill:  0    Partially dictated using Editor, commissioning. Any errors are unintentional.  Halina Maidens, MD [facility]  11/18/2017

## 2017-12-04 ENCOUNTER — Telehealth: Payer: Self-pay | Admitting: Internal Medicine

## 2017-12-04 NOTE — Telephone Encounter (Signed)
Called to schedule Medicare Annual Wellness Visit with Nurse Health Advisor. If patient returns call, please note: their last AWV was on 5/ 21/18 please schedule AWV with NHA any date after Dec 09 2017  Thank you! For any questions please contact: Jill Alexanders [phone]  Skype [email or web address]

## 2017-12-10 ENCOUNTER — Ambulatory Visit (INDEPENDENT_AMBULATORY_CARE_PROVIDER_SITE_OTHER): Payer: Medicare HMO

## 2017-12-10 VITALS — BP 124/62 | HR 84 | Temp 97.4°F | Ht 59.0 in | Wt 102.2 lb

## 2017-12-10 DIAGNOSIS — Z1239 Encounter for other screening for malignant neoplasm of breast: Secondary | ICD-10-CM

## 2017-12-10 DIAGNOSIS — E2839 Other primary ovarian failure: Secondary | ICD-10-CM | POA: Diagnosis not present

## 2017-12-10 DIAGNOSIS — Z1231 Encounter for screening mammogram for malignant neoplasm of breast: Secondary | ICD-10-CM

## 2017-12-10 DIAGNOSIS — Z1159 Encounter for screening for other viral diseases: Secondary | ICD-10-CM | POA: Diagnosis not present

## 2017-12-10 DIAGNOSIS — Z0000 Encounter for general adult medical examination without abnormal findings: Secondary | ICD-10-CM | POA: Diagnosis not present

## 2017-12-10 NOTE — Patient Instructions (Signed)
Crystal Kent , Thank you for taking time to come for your Medicare Wellness Visit. I appreciate your ongoing commitment to your health goals. Please review the following plan we discussed and let me know if I can assist you in the future.   Screening recommendations/referrals: Colorectal Screening: Completed 07/23/11. Repeat every 10 years Mammogram: Completed 02/09/15. Repeat every year. Ordered today. You will receive a call from our office regarding your appointment Bone Density: Ordered today. You will receive a call from our office regarding your appointment  Vision and Dental Exams: Recommended annual ophthalmology exams for early detection of glaucoma and other disorders of the eye Recommended annual dental exams for proper oral hygiene  Vaccinations: Influenza vaccine: Up to date Pneumococcal vaccine: Completed series Tdap vaccine: Up to date Shingles vaccine: Please call your insurance company to determine your out of pocket expense for the Shingrix vaccine. You may also receive this vaccine at your local pharmacy or Health Dept.  Advanced directives: Advance directive discussed with you today. I have provided a copy for you to complete at home and have notarized. Once this is complete please bring a copy in to our office so we can scan it into your chart.  Conditions/risks identified: Recommend to drink at least 6-8 8oz glasses of water per day.  Next appointment: Please schedule your Annual Wellness Visit with your Nurse Health Advisor in one year.  Preventive Care 21 Years and Older, Female Preventive care refers to lifestyle choices and visits with your health care provider that can promote health and wellness. What does preventive care include?  A yearly physical exam. This is also called an annual well check.  Dental exams once or twice a year.  Routine eye exams. Ask your health care provider how often you should have your eyes checked.  Personal lifestyle choices,  including:  Daily care of your teeth and gums.  Regular physical activity.  Eating a healthy diet.  Avoiding tobacco and drug use.  Limiting alcohol use.  Practicing safe sex.  Taking low-dose aspirin every day.  Taking vitamin and mineral supplements as recommended by your health care provider. What happens during an annual well check? The services and screenings done by your health care provider during your annual well check will depend on your age, overall health, lifestyle risk factors, and family history of disease. Counseling  Your health care provider may ask you questions about your:  Alcohol use.  Tobacco use.  Drug use.  Emotional well-being.  Home and relationship well-being.  Sexual activity.  Eating habits.  History of falls.  Memory and ability to understand (cognition).  Work and work Statistician.  Reproductive health. Screening  You may have the following tests or measurements:  Height, weight, and BMI.  Blood pressure.  Lipid and cholesterol levels. These may be checked every 5 years, or more frequently if you are over 36 years old.  Skin check.  Lung cancer screening. You may have this screening every year starting at age 16 if you have a 30-pack-year history of smoking and currently smoke or have quit within the past 15 years.  Fecal occult blood test (FOBT) of the stool. You may have this test every year starting at age 52.  Flexible sigmoidoscopy or colonoscopy. You may have a sigmoidoscopy every 5 years or a colonoscopy every 10 years starting at age 37.  Hepatitis C blood test.  Hepatitis B blood test.  Sexually transmitted disease (STD) testing.  Diabetes screening. This is done by checking  your blood sugar (glucose) after you have not eaten for a while (fasting). You may have this done every 1-3 years.  Bone density scan. This is done to screen for osteoporosis. You may have this done starting at age 86.  Mammogram. This  may be done every 1-2 years. Talk to your health care provider about how often you should have regular mammograms. Talk with your health care provider about your test results, treatment options, and if necessary, the need for more tests. Vaccines  Your health care provider may recommend certain vaccines, such as:  Influenza vaccine. This is recommended every year.  Tetanus, diphtheria, and acellular pertussis (Tdap, Td) vaccine. You may need a Td booster every 10 years.  Zoster vaccine. You may need this after age 70.  Pneumococcal 13-valent conjugate (PCV13) vaccine. One dose is recommended after age 61.  Pneumococcal polysaccharide (PPSV23) vaccine. One dose is recommended after age 56. Talk to your health care provider about which screenings and vaccines you need and how often you need them. This information is not intended to replace advice given to you by your health care provider. Make sure you discuss any questions you have with your health care provider. Document Released: 08/04/2015 Document Revised: 03/27/2016 Document Reviewed: 05/09/2015 Elsevier Interactive Patient Education  2017 [place] Prevention in the Home Falls can cause injuries. They can happen to people of all ages. There are many things you can do to make your home safe and to help prevent falls. What can I do on the outside of my home?  Regularly fix the edges of walkways and driveways and fix any cracks.  Remove anything that might make you trip as you walk through a door, such as a raised step or threshold.  Trim any bushes or trees on the path to your home.  Use bright outdoor lighting.  Clear any walking paths of anything that might make someone trip, such as rocks or tools.  Regularly check to see if handrails are loose or broken. Make sure that both sides of any steps have handrails.  Any raised decks and porches should have guardrails on the edges.  Have any leaves, snow, or ice cleared  regularly.  Use sand or salt on walking paths during winter.  Clean up any spills in your garage right away. This includes oil or grease spills. What can I do in the bathroom?  Use night lights.  Install grab bars by the toilet and in the tub and shower. Do not use towel bars as grab bars.  Use non-skid mats or decals in the tub or shower.  If you need to sit down in the shower, use a plastic, non-slip stool.  Keep the floor dry. Clean up any water that spills on the floor as soon as it happens.  Remove soap buildup in the tub or shower regularly.  Attach bath mats securely with double-sided non-slip rug tape.  Do not have throw rugs and other things on the floor that can make you trip. What can I do in the bedroom?  Use night lights.  Make sure that you have a light by your bed that is easy to reach.  Do not use any sheets or blankets that are too big for your bed. They should not hang down onto the floor.  Have a firm chair that has side arms. You can use this for support while you get dressed.  Do not have throw rugs and other things on the floor  that can make you trip. What can I do in the kitchen?  Clean up any spills right away.  Avoid walking on wet floors.  Keep items that you use a lot in easy-to-reach places.  If you need to reach something above you, use a strong step stool that has a grab bar.  Keep electrical cords out of the way.  Do not use floor polish or wax that makes floors slippery. If you must use wax, use non-skid floor wax.  Do not have throw rugs and other things on the floor that can make you trip. What can I do with my stairs?  Do not leave any items on the stairs.  Make sure that there are handrails on both sides of the stairs and use them. Fix handrails that are broken or loose. Make sure that handrails are as long as the stairways.  Check any carpeting to make sure that it is firmly attached to the stairs. Fix any carpet that is loose  or worn.  Avoid having throw rugs at the top or bottom of the stairs. If you do have throw rugs, attach them to the floor with carpet tape.  Make sure that you have a light switch at the top of the stairs and the bottom of the stairs. If you do not have them, ask someone to add them for you. What else can I do to help prevent falls?  Wear shoes that:  Do not have high heels.  Have rubber bottoms.  Are comfortable and fit you well.  Are closed at the toe. Do not wear sandals.  If you use a stepladder:  Make sure that it is fully opened. Do not climb a closed stepladder.  Make sure that both sides of the stepladder are locked into place.  Ask someone to hold it for you, if possible.  Clearly mark and make sure that you can see:  Any grab bars or handrails.  First and last steps.  Where the edge of each step is.  Use tools that help you move around (mobility aids) if they are needed. These include:  Canes.  Walkers.  Scooters.  Crutches.  Turn on the lights when you go into a dark area. Replace any light bulbs as soon as they burn out.  Set up your furniture so you have a clear path. Avoid moving your furniture around.  If any of your floors are uneven, fix them.  If there are any pets around you, be aware of where they are.  Review your medicines with your doctor. Some medicines can make you feel dizzy. This can increase your chance of falling. Ask your doctor what other things that you can do to help prevent falls. This information is not intended to replace advice given to you by your health care provider. Make sure you discuss any questions you have with your health care provider. Document Released: 05/04/2009 Document Revised: 12/14/2015 Document Reviewed: 08/12/2014 Elsevier Interactive Patient Education  2017 [employer].

## 2017-12-10 NOTE — Progress Notes (Signed)
Subjective:   Crystal Kent is a 72 y.o. female who presents for Medicare Annual (Subsequent) preventive examination.  Review of Systems:  N/A Cardiac Risk Factors include: advanced age (>50men, >93 women);hypertension;sedentary lifestyle     Objective:     Vitals: BP 124/62 (BP Location: Right Arm, Patient Position: Sitting, Cuff Size: Normal)   Pulse 84   Temp (!) 97.4 F (36.3 C) (Axillary)   Ht 4\' 11"  (1.499 m)   Wt 102 lb 3.2 oz (46.4 kg)   SpO2 96%   BMI 20.64 kg/m   Body mass index is 20.64 kg/m.  Advanced Directives 12/10/2017 04/23/2017 02/27/2017 12/09/2016 07/27/2016 07/26/2016 06/28/2016  Does Patient Have a Medical Advance Directive? No No No No No No No  Would patient like information on creating a medical advance directive? Yes (MAU/Ambulatory/Procedural Areas - Information given) - No - Patient declined No - Patient declined No - Patient declined - -    Tobacco Social History   Tobacco Use  Smoking Status Never Smoker  Smokeless Tobacco Never Used  Tobacco Comment   Smoking cessation materials not required     Counseling given: No Comment: Smoking cessation materials not required  Clinical Intake:  Pre-visit preparation completed: Yes  Pain : No/denies pain   BMI - recorded: 20.64 Nutritional Status: BMI of 19-24  Normal Nutritional Risks: None Diabetes: No  How often do you need to have someone help you when you read instructions, pamphlets, or other written materials from your doctor or pharmacy?: 1 - Never  Interpreter Needed?: No  Information entered by :: AEversole, LPN  Past Medical History:  Diagnosis Date  . Acid reflux   . 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 Difficult intubation    Pt reports "small airway"  . Hypertension   . Thyroid disease   . 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 ABDOMINAL HYSTERECTOMY    . BREAST  BIOPSY Left    bx/clip-neg  . hemimandibulectomy Right 1998  . MYRINGOTOMY WITH TUBE PLACEMENT Right 02/27/2017   Procedure: MYRINGOTOMY WITH TUBE PLACEMENT Right ear.;  Surgeon: Margaretha Sheffield, MD;  Location: [place] [facility];  Service: ENT;  Laterality: Right;  . partial maxillectomy Right   . PEG PLACEMENT N/A 05/27/2017   Procedure: PERCUTANEOUS ENDOSCOPIC GASTROSTOMY (PEG) PLACEMENT;  Surgeon: Lucilla Lame, MD;  Location: [facility] END[facility];  Service: Endoscopy;  Laterality: N/A;  . 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 Alzheimer's disease Mother   . Heart attack Father   . Healthy Son   . Healthy Son    Social History   Socioeconomic History  . Marital status: Married    Spouse name: Not on file  . Number of children: 3  . Years of education: Not on file  . Highest education level: 12th grade  Occupational History  . Occupation: Retired  Scientific laboratory technician  . Financial resource strain: Not hard at all  . Food insecurity:    Worry: Never true    Inability: Never true  . Transportation needs:    Medical: No    Non-medical: No  Tobacco Use  . Smoking status: Never Smoker  . Smokeless tobacco: Never Used  . Tobacco comment: Smoking cessation materials not required  Substance and Sexual Activity  . Alcohol use: No  .  Drug use: No  . Sexual activity: Not Currently  Lifestyle  . Physical activity:    Days per week: 0 days    Minutes per session: 0 min  . Stress: Not at all  Relationships  . Social connections:    Talks on phone: Patient refused    Gets together: Patient refused    Attends religious service: Patient refused    Active member of club or organization: Patient refused    Attends meetings of clubs or organizations: Patient refused    Relationship status: Married  Other Topics Concern  . Not on file  Social History Narrative  . Not on file    Outpatient Encounter Medications as of  12/10/2017  Medication Sig  . acetaminophen (TYLENOL) 325 MG tablet Take 650 mg by mouth every 6 (six) hours as needed.  Marland Kitchen albuterol (PROVENTIL HFA;VENTOLIN HFA) 108 (90 Base) MCG/ACT inhaler Inhale 2 puffs into the lungs every 6 (six) hours as needed for wheezing or shortness of breath.  Marland Kitchen amLODipine (NORVASC) 5 MG tablet TAKE 1 TABLET EVERY DAY  . fluticasone (FLONASE) 50 MCG/ACT nasal spray Place 2 sprays into both nostrils daily as needed.  Marland Kitchen levothyroxine (SYNTHROID, LEVOTHROID) 75 MCG tablet TAKE 1 TABLET EVERY DAY  . nystatin cream (MYCOSTATIN) once daily as needed.   Marland Kitchen omeprazole (PRILOSEC) 20 MG capsule Take 1 capsule (20 mg total) by mouth daily.  Marland Kitchen amoxicillin-clavulanate (AUGMENTIN) 875-125 MG tablet Take 1 tablet by mouth 2 (two) times daily.  Marland Kitchen levofloxacin (LEVAQUIN) 500 MG tablet Take 1 tablet (500 mg total) by mouth daily.   No facility-administered encounter medications on file as of 12/10/2017.     Activities of Daily Living In your present state of health, do you have any difficulty performing the following activities: 12/10/2017 02/27/2017  Hearing? Y N  Comment denies hearing aids; hearing loss R ear -  Vision? N N  Comment wears eyeglasses -  Difficulty concentrating or making decisions? N N  Walking or climbing stairs? N N  Dressing or bathing? N N  Doing errands, shopping? N -  Preparing Food and eating ? N -  Comment denies dentures -  Using the Toilet? N -  In the past six months, have you accidently leaked urine? N -  Do you have problems with loss of bowel control? N -  Managing your Medications? N -  Managing your Finances? N -  Housekeeping or managing your Housekeeping? N -  Some recent data might be hidden    Patient Care Team: Glean Hess, MD as PCP - General (Internal Medicine) Loletta Parish, MD as Referring Physician (Hematology and Oncology) Margaretha Sheffield, MD as Consulting Physician (Otolaryngology)    Assessment:   This is a routine  wellness examination for Crystal Kent.  Exercise Activities and Dietary recommendations Current Exercise Habits: The patient does not participate in regular exercise at present, Exercise limited by: None identified  Goals    . DIET - INCREASE WATER INTAKE     Recommend to drink at least 6-8 8oz glasses of water per day.       Fall Risk Fall Risk  12/10/2017 12/09/2016 06/28/2016  Falls in the past year? No Yes No  Number falls in past yr: - 1 -  Injury with Fall? - Yes -  Risk Factor Category  - High Fall Risk -  Risk for fall due to : History of fall(s);Impaired vision - -  Risk for fall due to: Comment wears eyeglasses - -  Follow up - Falls prevention discussed -   FALL RISK PREVENTION PERTAINING TO HOME: Is your home free of loose throw rugs in walkways, pet beds, electrical cords, etc? Yes Is there adequate lighting in your home to reduce risk of falls?  Yes Are there stairs in or around your home WITH handrails? Yes  ASSISTIVE DEVICES UTILIZED TO PREVENT FALLS: Use of a cane, walker or w/c? No Grab bars in the bathroom? No  Shower chair or a place to sit while bathing? No An elevated toilet seat or a handicapped toilet? No  Timed Get Up and Go Performed: Yes. Pt ambulated 10 feet within 8 sec. Gait stead-fast and without the use of an assistive device. No intervention required at this time. Fall risk prevention has been discussed.  Community Resource Referral:  Pt declined my offer to send [employer] Referral to Care Guide for installation of grab bars in the shower, shower chair or an elevated toilet seat.  Depression Screen PHQ 2/9 Scores 12/10/2017 12/09/2016 12/09/2016 06/28/2016  PHQ - 2 Score 0 0 0 0  PHQ- 9 Score 0 0 - -     Cognitive Function     6CIT Screen 12/10/2017 12/09/2016  What Year? 0 points 0 points  What month? 0 points 0 points  What time? 0 points 0 points  Count back from 20 0 points 0 points  Months in reverse 0 points 0 points  Repeat  phrase 0 points 0 points  Total Score 0 0    Immunization History  Administered Date(s) Administered  . Influenza, High Dose Seasonal PF 04/23/2017  . Influenza-Unspecified 04/21/2014  . Pneumococcal Conjugate-13 07/22/2008  . Pneumococcal Polysaccharide-23 07/23/2011  . Tdap 04/23/2017    Qualifies for Shingles Vaccine? Yes. Due for Shingrix. Education has been provided regarding the importance of this vaccine. Pt has been advised to call her insurance company to determine her out of pocket expense. Advised she may also receive this vaccine at her local pharmacy or Health Dept. Verbalized acceptance and understanding.  Screening Tests Health Maintenance  Topic Date Due  . Hepatitis C Screening  [birth year]  . DEXA SCAN  02/01/2010  . MAMMOGRAM  02/09/2016  . INFLUENZA VACCINE  02/19/2018  . COLONOSCOPY  07/22/2021  . TETANUS/TDAP  04/24/2027  . PNA vac Low Risk Adult  Completed    Cancer Screenings: Lung: Low Dose CT Chest recommended if Age 28-80 years, 30 pack-year currently smoking OR have quit w/in 15years. Patient does not qualify. Breast:  Up to date on Mammogram? No. Completed 02/09/15. Ordered today. Pt aware that she will receive a call from our office re: her appt.   Up to date of Bone Density/Dexa? No. Ordered today. Pt aware that she will receive a call from our office re: her appt. Colorectal: Completed 07/23/11. Repeat every 10 years.  Additional Screenings: Hepatitis C Screening: Ordered today. Lab requisition given to pt for completion.     Plan:  I have personally reviewed and addressed the Medicare Annual Wellness questionnaire and have noted the following in the patient's chart:  A. Medical and social history B. Use of alcohol, tobacco or illicit drugs  C. Current medications and supplements D. Functional ability and status E.  Nutritional status F.  Physical activity G. Advance directives H. List of other physicians I.  Hospitalizations, surgeries, and  ER visits in previous 12 months J.  [place] such as hearing and vision if needed, cognitive and depression L. Referrals and appointments  In addition, I have reviewed and discussed with patient certain preventive protocols, quality metrics, and best practice recommendations. A written personalized care plan for preventive services as well as general preventive health recommendations were provided to patient.  Signed,  Aleatha Borer, LPN Nurse Health Advisor  MD Recommendations: Due for Shingrix. Education has been provided regarding the importance of this vaccine. Pt has been advised to call her insurance company to determine her out of pocket expense. Advised she may also receive this vaccine at her local pharmacy or Health Dept. Verbalized acceptance and understanding.  Mammogram: Completed 02/09/15. Ordered today. Pt aware that she will receive a call from our office re: her appt.    Bone Density/Dexa: Ordered today. Pt aware that she will receive a call from our office re: her appt.  Hepatitis C Screening: Ordered today. Lab requisition given to pt for completion.

## 2017-12-11 LAB — HEPATITIS C ANTIBODY: HEP C VIRUS AB: 0.2 {s_co_ratio} (ref 0.0–0.9)

## 2017-12-25 ENCOUNTER — Ambulatory Visit
Admission: RE | Admit: 2017-12-25 | Discharge: 2017-12-25 | Disposition: A | Discharge status: Home / Self Care | Payer: Medicare HMO | Source: Ambulatory Visit | Attending: Internal Medicine | Admitting: Internal Medicine

## 2017-12-25 ENCOUNTER — Other Ambulatory Visit: Payer: Self-pay | Admitting: Internal Medicine

## 2017-12-25 DIAGNOSIS — Z1231 Encounter for screening mammogram for malignant neoplasm of breast: Secondary | ICD-10-CM | POA: Insufficient documentation

## 2017-12-25 DIAGNOSIS — E2839 Other primary ovarian failure: Secondary | ICD-10-CM | POA: Insufficient documentation

## 2017-12-25 DIAGNOSIS — Z1239 Encounter for other screening for malignant neoplasm of breast: Secondary | ICD-10-CM

## 2017-12-25 HISTORY — DX: Personal history of irradiation: Z92.3

## 2017-12-25 HISTORY — DX: Personal history of antineoplastic chemotherapy: Z92.21

## 2017-12-29 ENCOUNTER — Encounter: Payer: Self-pay | Admitting: Internal Medicine

## 2017-12-29 ENCOUNTER — Ambulatory Visit (INDEPENDENT_AMBULATORY_CARE_PROVIDER_SITE_OTHER): Payer: Medicare HMO | Admitting: Internal Medicine

## 2017-12-29 ENCOUNTER — Other Ambulatory Visit: Payer: Self-pay | Admitting: Internal Medicine

## 2017-12-29 ENCOUNTER — Ambulatory Visit
Admission: RE | Admit: 2017-12-29 | Discharge: 2017-12-29 | Disposition: A | Discharge status: Home / Self Care | Payer: Medicare HMO | Source: Ambulatory Visit | Attending: Internal Medicine | Admitting: Internal Medicine

## 2017-12-29 ENCOUNTER — Other Ambulatory Visit: Payer: Self-pay | Admitting: Family Medicine

## 2017-12-29 VITALS — BP 108/72 | HR 86 | Resp 16 | Ht 59.0 in | Wt 103.0 lb

## 2017-12-29 DIAGNOSIS — Z8701 Personal history of pneumonia (recurrent): Secondary | ICD-10-CM | POA: Insufficient documentation

## 2017-12-29 DIAGNOSIS — J181 Lobar pneumonia, unspecified organism: Secondary | ICD-10-CM | POA: Diagnosis not present

## 2017-12-29 DIAGNOSIS — Z09 Encounter for follow-up examination after completed treatment for conditions other than malignant neoplasm: Secondary | ICD-10-CM | POA: Insufficient documentation

## 2017-12-29 DIAGNOSIS — B354 Tinea corporis: Secondary | ICD-10-CM | POA: Diagnosis not present

## 2017-12-29 DIAGNOSIS — J9811 Atelectasis: Secondary | ICD-10-CM | POA: Insufficient documentation

## 2017-12-29 DIAGNOSIS — M810 Age-related osteoporosis without current pathological fracture: Secondary | ICD-10-CM | POA: Diagnosis not present

## 2017-12-29 DIAGNOSIS — J189 Pneumonia, unspecified organism: Secondary | ICD-10-CM

## 2017-12-29 NOTE — Progress Notes (Signed)
Date:  12/29/2017   Name:  Crystal Kent   DOB:  Nov 30, 1945   MRN:  [id number]   Chief Complaint: Osteoporosis OP - significant OP noted on DEXA.  Pt here to discuss options. 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  She is interested in treatment to reduce risk of fracture.  Cough  The problem has been rapidly improving. The problem occurs every few hours. The cough is non-productive. Associated symptoms include postnasal drip and a rash. Pertinent negatives include no chest pain, chills, fever, headaches or wheezing. Treatments tried: completed course of levaquin for RLL pneumonia.  Rash  This is a chronic problem. The affected locations include the abdomen. Associated symptoms include coughing. Pertinent negatives include no fever. (Tinea around g-tube site) The treatment provided significant relief.      Review of Systems  Constitutional: Negative for chills and fever.  HENT: Positive for postnasal drip.   Eyes: Negative for visual disturbance.  Respiratory: Positive for cough. Negative for chest tightness and wheezing.   Cardiovascular: Negative for chest pain and palpitations.  Skin: Positive for rash.  Neurological: Negative for dizziness and headaches.    Patient Active Problem List   Diagnosis Date Noted  . Insomnia 04/23/2017  . 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 Hypertension 06/28/2016  . Allergic rhinitis 06/28/2016  . Current use of proton pump inhibitor 06/28/2016  . Gastrostomy in place [facility]) 05/02/2016  . Chronic pansinusitis 02/13/2016  . Eustachian tube dysfunction 12/14/2014  . Hearing loss 12/14/2014  . Lesion of buccal mucosa 12/14/2014  . Dysphagia, oropharyngeal 05/23/2014  . Hypothyroidism 04/27/2014  . 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 Authorizing Provider  acetaminophen (TYLENOL) 325 MG tablet Take 650 mg by mouth every 6 (six) hours as needed.   Yes [provider]  albuterol (PROVENTIL HFA;VENTOLIN HFA) 108 (90 Base) MCG/ACT inhaler Inhale 2 puffs into the lungs every 6 (six) hours as needed for wheezing or shortness of breath. 11/18/17  Yes Glean Hess, MD  amLODipine (NORVASC) 5 MG tablet TAKE 1 TABLET EVERY DAY 10/16/17  Yes Plonk, Gwyndolyn Saxon, MD  fluticasone (FLONASE) 50 MCG/ACT nasal spray Place 2 sprays into both nostrils daily as needed. 04/27/13  Yes [provider]  levothyroxine (SYNTHROID, LEVOTHROID) 75 MCG tablet TAKE 1 TABLET EVERY DAY 10/16/17  Yes Plonk, Gwyndolyn Saxon, MD  omeprazole (PRILOSEC) 20 MG capsule Take 1 capsule (20 mg total) by mouth daily. 12/27/16  Yes Plonk, Gwyndolyn Saxon, MD    No Known Allergies  Past Surgical History:  Procedure Laterality Date  . ABDOMINAL HYSTERECTOMY    . BREAST BIOPSY Left    bx/clip-neg  . hemimandibulectomy Right 1998  . MYRINGOTOMY WITH TUBE PLACEMENT Right 02/27/2017   Procedure: MYRINGOTOMY WITH TUBE PLACEMENT Right ear.;  Surgeon: Margaretha Sheffield, MD;  Location: [place];  Service: ENT;  Laterality: Right;  . partial maxillectomy Right   . PEG PLACEMENT N/A 05/27/2017   Procedure: PERCUTANEOUS ENDOSCOPIC GASTROSTOMY (PEG) PLACEMENT;  Surgeon: Lucilla Lame, MD;  Location: [facility];  Service: Endoscopy;  Laterality: N/A;  . RADICAL NECK DISSECTION Right 1998   multiple oral cancers    Social History   Tobacco Use  . Smoking status: Never Smoker  . Smokeless tobacco: Never Used  . Tobacco comment: Smoking  cessation materials not required  Substance Use Topics  . Alcohol use: No  . Drug use: No     Medication list has been reviewed and updated.  Current Meds  Medication Sig  . acetaminophen (TYLENOL) 325 MG tablet Take 650 mg by mouth every 6 (six) hours as needed.  Marland Kitchen albuterol (PROVENTIL  HFA;VENTOLIN HFA) 108 (90 Base) MCG/ACT inhaler Inhale 2 puffs into the lungs every 6 (six) hours as needed for wheezing or shortness of breath.  Marland Kitchen amLODipine (NORVASC) 5 MG tablet TAKE 1 TABLET EVERY DAY  . fluticasone (FLONASE) 50 MCG/ACT nasal spray Place 2 sprays into both nostrils daily as needed.  Marland Kitchen levothyroxine (SYNTHROID, LEVOTHROID) 75 MCG tablet TAKE 1 TABLET EVERY DAY  . omeprazole (PRILOSEC) 20 MG capsule Take 1 capsule (20 mg total) by mouth daily.    PHQ 2/9 Scores 12/10/2017 12/09/2016 12/09/2016 06/28/2016  PHQ - 2 Score 0 0 0 0  PHQ- 9 Score 0 0 - -    Physical Exam  Constitutional: She is oriented to person, place, and time. She appears well-developed. No distress.  HENT:  Post surgical changes to right jaw stable  Cardiovascular: Normal rate, regular rhythm and normal heart sounds.  Pulmonary/Chest: Effort normal. No respiratory distress. She has rales (few coarse BS at both bases).  Musculoskeletal: Normal range of motion.  Neurological: She is alert and oriented to person, place, and time.  Skin: Skin is warm and dry. No rash noted.  Psychiatric: She has a normal mood and affect. Her behavior is normal. Thought content normal.  Nursing note and vitals reviewed.   BP 108/72   Pulse 86   Resp 16   Ht 4\' 11"  (1.499 m)   Wt 103 lb (46.7 kg)   SpO2 96%   BMI 20.80 kg/m   Assessment and Plan: 1. Osteoporosis, post-menopausal Refer to Rheum to discuss and begin appropriate therapy - Ambulatory referral to Rheumatology  2. Community acquired pneumonia of right lower lobe of lung ([place]) - DG Chest 2 View; Future  3. Tinea corporis Continue nystatin cream   No orders of the defined types were placed in this encounter.   Partially dictated using Editor, commissioning. Any errors are unintentional.  Halina Maidens, MD [facility]  12/29/2017   There are no diagnoses linked to this encounter.

## 2017-12-31 ENCOUNTER — Other Ambulatory Visit: Payer: Self-pay | Admitting: Internal Medicine

## 2017-12-31 DIAGNOSIS — M810 Age-related osteoporosis without current pathological fracture: Secondary | ICD-10-CM

## 2018-01-21 ENCOUNTER — Ambulatory Visit: Payer: Self-pay | Admitting: Family Medicine

## 2018-01-21 ENCOUNTER — Ambulatory Visit: Payer: Self-pay | Admitting: Internal Medicine

## 2018-01-23 ENCOUNTER — Ambulatory Visit: Payer: Self-pay | Admitting: Internal Medicine

## 2018-02-23 ENCOUNTER — Other Ambulatory Visit: Payer: Self-pay

## 2018-02-23 ENCOUNTER — Encounter: Payer: Self-pay | Admitting: Emergency Medicine

## 2018-02-23 ENCOUNTER — Ambulatory Visit
Admission: EM | Admit: 2018-02-23 | Discharge: 2018-02-23 | Disposition: A | Discharge status: Home / Self Care | Payer: Medicare HMO | Attending: Family Medicine | Admitting: Family Medicine

## 2018-02-23 DIAGNOSIS — J40 Bronchitis, not specified as acute or chronic: Secondary | ICD-10-CM

## 2018-02-23 DIAGNOSIS — J0100 Acute maxillary sinusitis, unspecified: Secondary | ICD-10-CM

## 2018-02-23 MED ORDER — AMOXICILLIN-POT CLAVULANATE 875-125 MG PO TABS: 1.0000 | ORAL_TABLET | Freq: Two times a day (BID) | ORAL | 0 refills | Status: DC

## 2018-02-23 NOTE — ED Triage Notes (Signed)
Patient c/o sinus pain and pressure, congestion that started yesterday.

## 2018-02-23 NOTE — ED Provider Notes (Signed)
[facility]    CSN: [id number] Arrival date & time: 02/23/18  1320     History   Chief Complaint Chief Complaint  Patient presents with  . Facial Pain  . URI    HPI Crystal Kent is a 72 y.o. female.   The history is provided by the patient.  URI  Presenting symptoms: congestion, facial pain, fatigue and fever   Severity:  Moderate Onset quality:  Sudden Duration:  5 days Timing:  Constant Progression:  Worsening Chronicity:  New Relieved by:  Nothing Ineffective treatments:  Inhaler Associated symptoms: sinus pain   Risk factors: being elderly   Risk factors comment:  Oral cancer s/p hemimandibulectomy with radical neck dissection   Past Medical History:  Diagnosis Date  . Acid reflux   . 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 Difficult intubation    Pt reports "small airway"  . Hypertension   . Personal history of chemotherapy   . Personal history of radiation therapy   . Thyroid disease   . Uses prosthesis    plate to cover hole in roof of mouth s/p cancer surgery    Patient Active Problem List   Diagnosis Date Noted  . Tinea corporis 12/29/2017  . Insomnia 04/23/2017  . 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 Hypertension 06/28/2016  . Allergic rhinitis 06/28/2016  . Current use of proton pump inhibitor 06/28/2016  . Gastrostomy in place [facility]) 05/02/2016  . Chronic pansinusitis 02/13/2016  . Eustachian tube dysfunction 12/14/2014  . Hearing loss 12/14/2014  . Lesion of buccal mucosa 12/14/2014  . Dysphagia, oropharyngeal 05/23/2014  . Hypothyroidism 04/27/2014  . 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 ABDOMINAL HYSTERECTOMY    . BREAST BIOPSY Left    bx/clip-neg  . hemimandibulectomy Right 1998  . MYRINGOTOMY WITH TUBE PLACEMENT Right 02/27/2017     Procedure: MYRINGOTOMY WITH TUBE PLACEMENT Right ear.;  Surgeon: Margaretha Sheffield, MD;  Location: [place];  Service: ENT;  Laterality: Right;  . partial maxillectomy Right   . PEG PLACEMENT N/A 05/27/2017   Procedure: PERCUTANEOUS ENDOSCOPIC GASTROSTOMY (PEG) PLACEMENT;  Surgeon: Lucilla Lame, MD;  Location: [facility];  Service: Endoscopy;  Laterality: N/A;  . RADICAL NECK DISSECTION Right 1998   multiple oral cancers    OB History   None      Home Medications    Prior to Admission medications   Medication Sig Start Date End Date Taking? Authorizing Provider  acetaminophen (TYLENOL) 325 MG tablet Take 650 mg by mouth every 6 (six) hours as needed.   Yes [provider]  albuterol (PROVENTIL HFA;VENTOLIN HFA) 108 (90 Base) MCG/ACT inhaler Inhale 2 puffs into the lungs every 6 (six) hours as needed for wheezing or shortness of breath. 11/18/17  Yes Glean Hess, MD  amLODipine (NORVASC) 5 MG tablet TAKE 1 TABLET EVERY DAY 10/16/17  Yes Plonk, Gwyndolyn Saxon, MD  fluticasone (FLONASE) 50 MCG/ACT nasal spray Place 2 sprays into both nostrils daily as needed. 04/27/13  Yes [provider]  levothyroxine (SYNTHROID, LEVOTHROID) 75 MCG tablet TAKE 1 TABLET EVERY DAY 10/16/17  Yes Plonk, Gwyndolyn Saxon, MD  omeprazole (PRILOSEC) 20 MG capsule TAKE 1 CAPSULE EVERY DAY 12/30/17  Yes Glean Hess, MD  amoxicillin-clavulanate (AUGMENTIN) 875-125 MG tablet Take 1 tablet by mouth 2 (two) times daily.  02/23/18   Norval Gable, MD    Family History Family History  Problem Relation Age of Onset  . Alzheimer's disease Mother   . Heart attack Father   . Healthy Son   . Healthy Son   . Breast cancer Neg Hx     Social History Social History   Tobacco Use  . Smoking status: Never Smoker  . Smokeless tobacco: Never Used  . Tobacco comment: Smoking cessation materials not required  Substance Use Topics  . Alcohol use: No  . Drug use: No     Allergies   Patient has  no known allergies.   Review of Systems Review of Systems  Constitutional: Positive for fatigue and fever.  HENT: Positive for congestion and sinus pain.      Physical Exam Triage Vital Signs ED Triage Vitals  Enc Vitals Group     BP 02/23/18 1334 131/83     Pulse Rate 02/23/18 1334 89     Resp 02/23/18 1334 16     Temp 02/23/18 1334 98 F (36.7 C)     Temp Source 02/23/18 1334 Oral     SpO2 02/23/18 1334 98 %     Weight 02/23/18 1332 103 lb (46.7 kg)     Height 02/23/18 1332 4\' 11"  (1.499 m)     Head Circumference --      Peak Flow --      Pain Score 02/23/18 1332 5     Pain Loc --      Pain Edu? --      Excl. in [place]? --    No data found.  Updated Vital Signs BP 131/83 (BP Location: Right Arm)   Pulse 89   Temp 98 F (36.7 C) (Oral)   Resp 16   Ht 4\' 11"  (1.499 m)   Wt 103 lb (46.7 kg)   SpO2 98%   BMI 20.80 kg/m   Visual Acuity Right Eye Distance:   Left Eye Distance:   Bilateral Distance:    Right Eye Near:   Left Eye Near:    Bilateral Near:     Physical Exam  Constitutional: She appears well-developed and well-nourished. No distress.  HENT:  Head: Normocephalic and atraumatic.  Right Ear: Tympanic membrane, external ear and ear canal normal.  Left Ear: Tympanic membrane, external ear and ear canal normal.  Nose: Mucosal edema and rhinorrhea present. No nose lacerations, sinus tenderness, nasal deformity, septal deviation or nasal septal hematoma. No epistaxis.  No foreign bodies. Right sinus exhibits maxillary sinus tenderness and frontal sinus tenderness. Left sinus exhibits maxillary sinus tenderness and frontal sinus tenderness.  Mouth/Throat: Uvula is midline, oropharynx is clear and moist and mucous membranes are normal. No oropharyngeal exudate.  Eyes: Conjunctivae are normal. Right eye exhibits no discharge. Left eye exhibits no discharge. No scleral icterus.  Neck: Normal range of motion. Neck supple. No thyromegaly present.  Cardiovascular:  Normal rate, regular rhythm and normal heart sounds.  Pulmonary/Chest: Effort normal and breath sounds normal. No respiratory distress. She has no wheezes. She has no rales.  Lymphadenopathy:    She has no cervical adenopathy.  Skin: She is not diaphoretic.  Nursing note and vitals reviewed.    UC Treatments / Results  Labs (all labs ordered are listed, but only abnormal results are displayed) Labs Reviewed - No data to display  EKG None  Radiology No results found.  Procedures Procedures (including critical care time)  Medications Ordered in UC Medications - No data to  display  Initial Impression / Assessment and Plan / UC Course  I have reviewed the triage vital signs and the nursing notes.  Pertinent labs & imaging results that were available during my care of the patient were reviewed by me and considered in my medical decision making (see chart for details).      Final Clinical Impressions(s) / UC Diagnoses   Final diagnoses:  Acute maxillary sinusitis, recurrence not specified  Bronchitis   Discharge Instructions   None    ED Prescriptions    Medication Sig Dispense Auth. Provider   amoxicillin-clavulanate (AUGMENTIN) 875-125 MG tablet Take 1 tablet by mouth 2 (two) times daily. 20 tablet Norval Gable, MD     1. diagnosis reviewed with patient 2. rx as per orders above; reviewed possible side effects, interactions, risks and benefits  3. Recommend supportive treatment with otc flonase 4. Follow-up prn if symptoms worsen or don't improve   Controlled Substance Prescriptions [place] Controlled Substance Registry consulted? Not Applicable   Norval Gable, MD 02/23/18 [phone]

## 2018-03-16 DIAGNOSIS — B370 Candidal stomatitis: Secondary | ICD-10-CM | POA: Insufficient documentation

## 2018-04-30 ENCOUNTER — Ambulatory Visit (INDEPENDENT_AMBULATORY_CARE_PROVIDER_SITE_OTHER): Payer: Medicare HMO

## 2018-04-30 DIAGNOSIS — Z23 Encounter for immunization: Secondary | ICD-10-CM | POA: Diagnosis not present

## 2018-06-03 ENCOUNTER — Encounter: Payer: Self-pay | Admitting: Internal Medicine

## 2018-06-03 ENCOUNTER — Ambulatory Visit (INDEPENDENT_AMBULATORY_CARE_PROVIDER_SITE_OTHER): Payer: Medicare HMO | Admitting: Internal Medicine

## 2018-06-03 VITALS — BP 112/58 | HR 97 | Temp 97.7°F | Ht 59.0 in | Wt 104.2 lb

## 2018-06-03 DIAGNOSIS — R05 Cough: Secondary | ICD-10-CM | POA: Diagnosis not present

## 2018-06-03 DIAGNOSIS — H6690 Otitis media, unspecified, unspecified ear: Secondary | ICD-10-CM

## 2018-06-03 DIAGNOSIS — R059 Cough, unspecified: Secondary | ICD-10-CM

## 2018-06-03 MED ORDER — AZITHROMYCIN 200 MG/5ML PO SUSR: ORAL | 0 refills | Status: DC

## 2018-06-03 MED ORDER — GUAIFENESIN-CODEINE 100-10 MG/5ML PO SYRP: 5.0000 mL | ORAL_SOLUTION | Freq: Three times a day (TID) | ORAL | 0 refills | Status: DC | PRN

## 2018-06-03 NOTE — Progress Notes (Signed)
Date:  06/03/2018   Name:  Crystal Kent   DOB:  [birth year]   MRN:  [id number]   Chief Complaint: Cough (Started Yesterday. SOB with Coughing. Headache with chills. No production. )  Cough  This is a new problem. The current episode started yesterday. The problem occurs every few minutes. The cough is non-productive. Associated symptoms include chills, ear pain and headaches. Pertinent negatives include no chest pain, fever or wheezing.    Review of Systems  Constitutional: Positive for chills. Negative for fatigue and fever.  HENT: Positive for congestion and ear pain. Negative for hearing loss and tinnitus.   Respiratory: Positive for cough. Negative for chest tightness and wheezing.   Cardiovascular: Negative for chest pain and palpitations.  Neurological: Positive for headaches.    Patient Active Problem List   Diagnosis Date Noted  . Tinea corporis 12/29/2017  . Insomnia 04/23/2017  . 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 Hypertension 06/28/2016  . Allergic rhinitis 06/28/2016  . Current use of proton pump inhibitor 06/28/2016  . 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 Chronic pansinusitis 02/13/2016  . Eustachian tube dysfunction 12/14/2014  . Hearing loss 12/14/2014  . Lesion of buccal mucosa 12/14/2014  . Dysphagia, oropharyngeal 05/23/2014  . Hypothyroidism 04/27/2014  . 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 ABDOMINAL HYSTERECTOMY    . BREAST BIOPSY Left    bx/clip-neg  . hemimandibulectomy Right 1998  . MYRINGOTOMY WITH TUBE PLACEMENT Right 02/27/2017   Procedure: MYRINGOTOMY WITH TUBE PLACEMENT Right ear.;  Surgeon: Margaretha Sheffield, MD;  Location: [place];  Service: ENT;  Laterality: Right;  . partial maxillectomy Right   . PEG PLACEMENT N/A  05/27/2017   Procedure: PERCUTANEOUS ENDOSCOPIC GASTROSTOMY (PEG) PLACEMENT;  Surgeon: Lucilla Lame, MD;  Location: [facility];  Service: Endoscopy;  Laterality: N/A;  . RADICAL NECK DISSECTION Right 1998   multiple oral cancers    Social History   Tobacco Use  . Smoking status: Never Smoker  . Smokeless tobacco: Never Used  . Tobacco comment: Smoking cessation materials not required  Substance Use Topics  . Alcohol use: No  . Drug use: No     Medication list has been reviewed and updated.  Current Meds  Medication Sig  . acetaminophen (TYLENOL) 325 MG tablet Take 650 mg by mouth every 6 (six) hours as needed.  Marland Kitchen amLODipine (NORVASC) 5 MG tablet TAKE 1 TABLET EVERY DAY  . fluticasone (FLONASE) 50 MCG/ACT nasal spray Place 2 sprays into both nostrils daily as needed.  Marland Kitchen levothyroxine (SYNTHROID, LEVOTHROID) 75 MCG tablet TAKE 1 TABLET EVERY DAY  . omeprazole (PRILOSEC) 20 MG capsule TAKE 1 CAPSULE EVERY DAY    PHQ 2/9 Scores 12/10/2017 12/09/2016 12/09/2016 06/28/2016  PHQ - 2 Score 0 0 0 0  PHQ- 9 Score 0 0 - -    Physical Exam  Constitutional: She is oriented to person, place, and time. She appears well-developed. No distress.  HENT:  Head: Normocephalic and atraumatic.  Right Ear: Ear canal normal. Tympanic membrane is perforated (tube in place).  Left Ear: Ear canal normal. Tympanic membrane is erythematous and retracted. A middle ear effusion is present.  Nose: Right sinus exhibits no maxillary sinus tenderness. Left sinus exhibits no maxillary sinus tenderness.  Cardiovascular: Normal rate, regular rhythm and normal  heart sounds.  Pulmonary/Chest: Effort normal and breath sounds normal. No respiratory distress. She has no wheezes.  Musculoskeletal: Normal range of motion.  Lymphadenopathy:    She has no cervical adenopathy.  Neurological: She is alert and oriented to person, place, and time.  Skin: Skin is warm and dry. No rash noted.  Psychiatric: She has a normal  mood and affect. Her behavior is normal. Thought content normal.  Nursing note and vitals reviewed.   BP (!) 112/58 (BP Location: Right Arm, Patient Position: Sitting, Cuff Size: Normal)   Pulse 97   Temp 97.7 F (36.5 C) (Oral)   Ht 4\' 11"  (1.499 m)   Wt 104 lb 3.2 oz (47.3 kg)   SpO2 93%   BMI 21.05 kg/m   Assessment and Plan: 1. Acute otitis media, unspecified otitis media type - azithromycin (ZITHROMAX) 200 MG/5ML suspension; Take 12.5 mLs (500 mg total) by mouth daily for 1 day, THEN 6.3 mLs (250 mg total) daily for 4 days.  Dispense: 40 mL; Refill: 0 Suspension given via g-tube  2. Cough - guaiFENesin-codeine (ROBITUSSIN AC) 100-10 MG/5ML syrup; Take 5 mLs by mouth 3 (three) times daily as needed for cough.  Dispense: 150 mL; Refill: 0   Partially dictated using Editor, commissioning. Any errors are unintentional.  Halina Maidens, MD [facility]  06/03/2018

## 2018-07-25 ENCOUNTER — Other Ambulatory Visit: Payer: Self-pay

## 2018-07-25 ENCOUNTER — Ambulatory Visit
Admission: EM | Admit: 2018-07-25 | Discharge: 2018-07-25 | Disposition: A | Discharge status: Home / Self Care | Payer: Medicare HMO | Attending: Emergency Medicine | Admitting: Emergency Medicine

## 2018-07-25 DIAGNOSIS — J069 Acute upper respiratory infection, unspecified: Secondary | ICD-10-CM | POA: Diagnosis not present

## 2018-07-25 MED ORDER — PSEUDOEPH-BROMPHEN-DM 30-2-10 MG/5ML PO SYRP: 2.5000 mL | ORAL_SOLUTION | Freq: Four times a day (QID) | ORAL | 0 refills | Status: DC | PRN

## 2018-07-25 MED ORDER — AMOXICILLIN-POT CLAVULANATE 875-125 MG PO TABS: 1.0000 | ORAL_TABLET | Freq: Two times a day (BID) | ORAL | 0 refills | Status: DC

## 2018-07-25 NOTE — ED Provider Notes (Signed)
[facility]    CSN: [id number] Arrival date & time: 07/25/18  1025     History   Chief Complaint Chief Complaint  Patient presents with  . Cough    HPI Crystal Kent is a 73 y.o. female.   HPI  73 year old female presents with cough drainage sinus pain and pressure with checked of fever last night.  She states that this is been going on for over a week.  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  She has a chronic pansinusitis history.        Past Medical History:  Diagnosis Date  . Acid reflux   . 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 Difficult intubation    Pt reports "small airway"  . Hypertension   . Personal history of chemotherapy   . Personal history of radiation therapy   . Thyroid disease   . Uses prosthesis    plate to cover hole in roof of mouth s/p cancer surgery    Patient Active Problem List   Diagnosis Date Noted  . Tinea corporis 12/29/2017  . Insomnia 04/23/2017  . 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 Hypertension 06/28/2016  . Allergic rhinitis 06/28/2016  . Current use of proton pump inhibitor 06/28/2016  . Gastrostomy in place [facility]) 05/02/2016  . Chronic pansinusitis 02/13/2016  . Eustachian tube dysfunction 12/14/2014  . Hearing loss 12/14/2014  . Lesion of buccal mucosa 12/14/2014  . Dysphagia, oropharyngeal 05/23/2014  . Hypothyroidism 04/27/2014  . 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 Malignant neoplasm of base of tongue ([place]) 10/09/2011  . 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 ABDOMINAL HYSTERECTOMY    . BREAST BIOPSY Left    bx/clip-neg  . hemimandibulectomy Right 1998  . MYRINGOTOMY WITH TUBE PLACEMENT Right 02/27/2017   Procedure: MYRINGOTOMY WITH TUBE PLACEMENT Right ear.;  Surgeon: Margaretha Sheffield, MD;  Location: [place];   Service: ENT;  Laterality: Right;  . partial maxillectomy Right   . PEG PLACEMENT N/A 05/27/2017   Procedure: PERCUTANEOUS ENDOSCOPIC GASTROSTOMY (PEG) PLACEMENT;  Surgeon: Lucilla Lame, MD;  Location: [facility];  Service: Endoscopy;  Laterality: N/A;  . RADICAL NECK DISSECTION Right 1998   multiple oral cancers    OB History   No obstetric history on file.      Home Medications    Prior to Admission medications   Medication Sig Start Date End Date Taking? Authorizing Provider  acetaminophen (TYLENOL) 325 MG tablet Take 650 mg by mouth every 6 (six) hours as needed.   Yes [provider]  amLODipine (NORVASC) 5 MG tablet TAKE 1 TABLET EVERY DAY 10/16/17  Yes Plonk, Gwyndolyn Saxon, MD  fluticasone (FLONASE) 50 MCG/ACT nasal spray Place 2 sprays into both nostrils daily as needed. 04/27/13  Yes [provider]  levothyroxine (SYNTHROID, LEVOTHROID) 75 MCG tablet TAKE 1 TABLET EVERY DAY 10/16/17  Yes Plonk, Gwyndolyn Saxon, MD  omeprazole (PRILOSEC) 20 MG capsule TAKE 1 CAPSULE EVERY DAY 12/30/17  Yes Glean Hess, MD  amoxicillin-clavulanate (AUGMENTIN) 875-125 MG tablet Take 1 tablet by mouth every 12 (twelve) hours. 07/25/18   Lorin Picket, PA-C  brompheniramine-pseudoephedrine-DM 30-2-10 MG/5ML syrup Take 2.5 mLs by mouth 4 (four) times daily as needed. 07/25/18   Lorin Picket, PA-C    Family History Family History  Problem Relation Age of Onset  .  Alzheimer's disease Mother   . Heart attack Father   . Healthy Son   . Healthy Son   . Breast cancer Neg Hx     Social History Social History   Tobacco Use  . Smoking status: Never Smoker  . Smokeless tobacco: Never Used  . Tobacco comment: Smoking cessation materials not required  Substance Use Topics  . Alcohol use: No  . Drug use: No     Allergies   Patient has no known allergies.   Review of Systems Review of Systems  Constitutional: Positive for activity change, chills, fatigue and fever.  HENT:  Positive for congestion, postnasal drip, sinus pressure, sinus pain and sore throat.   Respiratory: Positive for cough.   All other systems reviewed and are negative.    Physical Exam Triage Vital Signs ED Triage Vitals  Enc Vitals Group     BP 07/25/18 1034 92/68     Pulse Rate 07/25/18 1034 98     Resp 07/25/18 1034 18     Temp 07/25/18 1034 (!) 97.5 F (36.4 C)     Temp Source 07/25/18 1034 Oral     SpO2 07/25/18 1034 94 %     Weight 07/25/18 1033 102 lb (46.3 kg)     Height 07/25/18 1033 4\' 11"  (1.499 m)     Head Circumference --      Peak Flow --      Pain Score 07/25/18 1032 8     Pain Loc --      Pain Edu? --      Excl. in [place]? --    No data found.  Updated Vital Signs BP 92/68 (BP Location: Left Arm)   Pulse 98   Temp (!) 97.5 F (36.4 C) (Oral)   Resp 18   Ht 4\' 11"  (1.499 m)   Wt 102 lb (46.3 kg)   SpO2 94% Comment: audible wheezing  BMI 20.60 kg/m   Visual Acuity Right Eye Distance:   Left Eye Distance:   Bilateral Distance:    Right Eye Near:   Left Eye Near:    Bilateral Near:     Physical Exam Vitals signs and nursing note reviewed.  Constitutional:      General: She is not in acute distress.    Appearance: Normal appearance. She is normal weight. She is not ill-appearing, toxic-appearing or diaphoretic.  HENT:     Head: Normocephalic.     Right Ear: Ear canal normal.     Left Ear: Tympanic membrane normal.     Ears:     Comments: Patient has a myringotomy tube on the right    Nose: Nose normal.     Mouth/Throat:     Mouth: Mucous membranes are moist.     Pharynx: No oropharyngeal exudate or posterior oropharyngeal erythema.     Comments: Has a deformity and extensive surgery of her health and throat. Eyes:     General:        Right eye: No discharge.        Left eye: No discharge.     Extraocular Movements: Extraocular movements intact.     Conjunctiva/sclera: Conjunctivae normal.     Pupils: Pupils are equal, round, and reactive to  light.  Pulmonary:     Effort: Pulmonary effort is normal.     Breath sounds: Normal breath sounds.     Comments: She had rhonchi on the right side that cleared with coughing Musculoskeletal: Normal range of motion.  Lymphadenopathy:  Cervical: No cervical adenopathy.  Skin:    General: Skin is warm and dry.  Neurological:     General: No focal deficit present.     Mental Status: She is alert and oriented to person, place, and time.  Psychiatric:        Mood and Affect: Mood normal.        Behavior: Behavior normal.        Thought Content: Thought content normal.        Judgment: Judgment normal.      UC Treatments / Results  Labs (all labs ordered are listed, but only abnormal results are displayed) Labs Reviewed - No data to display  EKG None  Radiology No results found.  Procedures Procedures (including critical care time)  Medications Ordered in UC Medications - No data to display  Initial Impression / Assessment and Plan / UC Course  I have reviewed the triage vital signs and the nursing notes.  Pertinent labs & imaging results that were available during my care of the patient were reviewed by me and considered in my medical decision making (see chart for details).   Of the patient's extensive past surgical history and for chemotherapy and radiation as well as her chronic pansinusitis I will start her on Augmentin which she states has been the most official antibiotic.  Prescribe brompheniramine cough syrup.  She should follow-up with her primary care physician if she is not improving   Final Clinical Impressions(s) / UC Diagnoses   Final diagnoses:  Upper respiratory tract infection, unspecified type   Discharge Instructions   None    ED Prescriptions    Medication Sig Dispense Auth. Provider   amoxicillin-clavulanate (AUGMENTIN) 875-125 MG tablet Take 1 tablet by mouth every 12 (twelve) hours. 20 tablet Crecencio Mc P, PA-C    brompheniramine-pseudoephedrine-DM 30-2-10 MG/5ML syrup Take 2.5 mLs by mouth 4 (four) times daily as needed. 60 mL Lorin Picket, PA-C     Controlled Substance Prescriptions [place] Controlled Substance Registry consulted? Not Applicable   Lorin Picket, PA-C 07/25/18 1156

## 2018-07-25 NOTE — ED Triage Notes (Signed)
Patient complains of cough, drainage, sinus pain and pressure with a fever last night.

## 2018-07-28 ENCOUNTER — Encounter: Payer: Self-pay | Admitting: Internal Medicine

## 2018-07-28 ENCOUNTER — Ambulatory Visit (INDEPENDENT_AMBULATORY_CARE_PROVIDER_SITE_OTHER): Payer: Medicare HMO | Admitting: Internal Medicine

## 2018-07-28 ENCOUNTER — Ambulatory Visit
Admission: RE | Admit: 2018-07-28 | Discharge: 2018-07-28 | Disposition: A | Discharge status: Home / Self Care | Payer: Medicare HMO | Attending: Internal Medicine | Admitting: Internal Medicine

## 2018-07-28 ENCOUNTER — Ambulatory Visit
Admission: RE | Admit: 2018-07-28 | Discharge: 2018-07-28 | Disposition: A | Discharge status: Home / Self Care | Payer: Medicare HMO | Source: Ambulatory Visit | Attending: Internal Medicine | Admitting: Internal Medicine

## 2018-07-28 VITALS — BP 134/70 | HR 95 | Temp 97.6°F | Ht 59.0 in | Wt 106.6 lb

## 2018-07-28 DIAGNOSIS — J181 Lobar pneumonia, unspecified organism: Secondary | ICD-10-CM | POA: Insufficient documentation

## 2018-07-28 DIAGNOSIS — J189 Pneumonia, unspecified organism: Secondary | ICD-10-CM

## 2018-07-28 MED ORDER — BENZONATATE 100 MG PO CAPS: 100.0000 mg | ORAL_CAPSULE | Freq: Three times a day (TID) | ORAL | 0 refills | Status: DC

## 2018-07-28 MED ORDER — ALBUTEROL SULFATE HFA 108 (90 BASE) MCG/ACT IN AERS: 2.0000 | INHALATION_SPRAY | Freq: Four times a day (QID) | RESPIRATORY_TRACT | 0 refills | Status: AC | PRN

## 2018-07-28 MED ORDER — LEVOFLOXACIN 500 MG PO TABS: 500.0000 mg | ORAL_TABLET | Freq: Every day | ORAL | 0 refills | Status: AC

## 2018-07-28 NOTE — Progress Notes (Signed)
Date:  07/28/2018   Name:  Crystal Kent   DOB:  04/08/1946   MRN:  [id number]   Chief Complaint: Cough (Seen UC Saturday, gave Abx and taking but not getting any better. Not taking coughinng syrup because it didn't do "any good." Coughing- Sometimes having production. Feels SOb at night when laying down. Headache. Body aches. )  Cough  This is a new problem. The current episode started in the past 7 days. The problem has been unchanged. The problem occurs every few minutes. The cough is non-productive. Associated symptoms include chills, headaches, myalgias and shortness of breath. Pertinent negatives include no chest pain, fever, postnasal drip, sore throat, weight loss or wheezing. The symptoms are aggravated by lying down. Treatments tried: augmentin. The treatment provided no relief.    Review of Systems  Constitutional: Positive for chills. Negative for fever and weight loss.  HENT: Negative for congestion, postnasal drip, sinus pressure and sore throat.   Eyes: Negative for visual disturbance.  Respiratory: Positive for cough, chest tightness and shortness of breath. Negative for wheezing.   Cardiovascular: Negative for chest pain, palpitations and leg swelling.  Gastrointestinal: Positive for diarrhea. Negative for abdominal pain.  Musculoskeletal: Positive for myalgias.  Neurological: Positive for headaches. Negative for dizziness and light-headedness.  Hematological: Negative for adenopathy.  Psychiatric/Behavioral: Positive for sleep disturbance.    Patient Active Problem List   Diagnosis Date Noted  . Tinea corporis 12/29/2017  . Insomnia 04/23/2017  . 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 Hypertension 06/28/2016  . Allergic rhinitis 06/28/2016  . Current use of proton pump inhibitor 06/28/2016  . Gastrostomy in place [facility]) 05/02/2016  . Chronic pansinusitis 02/13/2016  . Eustachian tube dysfunction 12/14/2014  . Hearing loss 12/14/2014  . Lesion of  buccal mucosa 12/14/2014  . Dysphagia, oropharyngeal 05/23/2014  . Hypothyroidism 04/27/2014  . 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 ABDOMINAL HYSTERECTOMY    . BREAST BIOPSY Left    bx/clip-neg  . hemimandibulectomy Right 1998  . MYRINGOTOMY WITH TUBE PLACEMENT Right 02/27/2017   Procedure: MYRINGOTOMY WITH TUBE PLACEMENT Right ear.;  Surgeon: Margaretha Sheffield, MD;  Location: [place];  Service: ENT;  Laterality: Right;  . partial maxillectomy Right   . PEG PLACEMENT N/A 05/27/2017   Procedure: PERCUTANEOUS ENDOSCOPIC GASTROSTOMY (PEG) PLACEMENT;  Surgeon: Lucilla Lame, MD;  Location: [facility];  Service: Endoscopy;  Laterality: N/A;  . RADICAL NECK DISSECTION Right 1998   multiple oral cancers    Social History   Tobacco Use  . Smoking status: Never Smoker  . Smokeless tobacco: Never Used  . Tobacco comment: Smoking cessation materials not required  Substance Use Topics  . Alcohol use: No  . Drug use: No     Medication list has been reviewed and updated.  Current Meds  Medication Sig  . acetaminophen (TYLENOL) 325 MG tablet Take 650 mg by mouth every 6 (six) hours as needed.  Marland Kitchen amLODipine (NORVASC) 5 MG tablet TAKE 1 TABLET EVERY DAY  . fluticasone (FLONASE) 50 MCG/ACT nasal spray Place 2 sprays into both nostrils daily as needed.  Marland Kitchen levothyroxine (SYNTHROID, LEVOTHROID) 75 MCG tablet TAKE 1 TABLET EVERY DAY  . omeprazole (PRILOSEC) 20 MG capsule TAKE 1 CAPSULE EVERY DAY    PHQ 2/9 Scores 12/10/2017 12/09/2016 12/09/2016 06/28/2016  PHQ - 2 Score 0 0 0 0  PHQ- 9 Score 0 0 - -    Physical Exam Constitutional:      Appearance: She is ill-appearing.  HENT:     Head: Normocephalic.     Right Ear: Tympanic membrane and ear canal normal.     Left Ear: Tympanic membrane  and ear canal normal.     Ears:     Comments: Tube in right ear canal    Nose:     Right Sinus: Frontal sinus tenderness present. No maxillary sinus tenderness.     Left Sinus: Frontal sinus tenderness present. No maxillary sinus tenderness.     Mouth/Throat:     Comments: Oral cavity not examined due to post surgical changes Neck:     Musculoskeletal: Normal range of motion.  Cardiovascular:     Rate and Rhythm: Normal rate and regular rhythm.     Pulses: Normal pulses.  Pulmonary:     Breath sounds: Decreased air movement present. Examination of the right-upper field reveals wheezing. Examination of the left-upper field reveals wheezing. Examination of the right-lower field reveals rhonchi. Decreased breath sounds, wheezing and rhonchi present.  Neurological:     Mental Status: She is alert.     BP 134/70 (BP Location: Right Arm, Patient Position: Sitting, Cuff Size: Normal)   Pulse 95   Temp 97.6 F (36.4 C) (Axillary)   Ht 4\' 11"  (1.499 m)   Wt 106 lb 9.6 oz (48.4 kg)   SpO2 92%   BMI 21.53 kg/m   Assessment and Plan: 1. Community acquired pneumonia of right lower lobe of lung ([place]) Stop augmentin due to diarrhea Start a probiotic Continue codeine cough syrup at night - DG Chest 2 View; Future - levofloxacin (LEVAQUIN) 500 MG tablet; Take 1 tablet (500 mg total) by mouth daily for 7 days.  Dispense: 7 tablet; Refill: 0 - albuterol (PROVENTIL HFA;VENTOLIN HFA) 108 (90 Base) MCG/ACT inhaler; Inhale 2 puffs into the lungs every 6 (six) hours as needed for wheezing or shortness of breath.  Dispense: 1 Inhaler; Refill: 0 - benzonatate (TESSALON) 100 MG capsule; Take 1 capsule (100 mg total) by mouth 3 (three) times daily.  Dispense: 20 capsule; Refill: 0   Partially dictated using Editor, commissioning. Any errors are unintentional.  Halina Maidens, MD [facility]  07/28/2018

## 2018-07-28 NOTE — Patient Instructions (Addendum)
Stop Amoxicillin anti-biotic.  Start a probiotic twice a day - pick up at drugstore over the counter.

## 2018-07-29 NOTE — Progress Notes (Signed)
Called and spoke with patient and husband on speaker phone (since she has difficulty talking). Informed patient and husband of Xray findings. Scheduled her for a 6 week follow up. Told her she needs to come in if she does not get any better or worsens before then. She said "okay."

## 2018-08-04 ENCOUNTER — Other Ambulatory Visit: Payer: Self-pay | Admitting: Family Medicine

## 2018-08-04 ENCOUNTER — Other Ambulatory Visit: Payer: Self-pay | Admitting: Internal Medicine

## 2018-08-04 MED ORDER — LEVOTHYROXINE SODIUM 75 MCG PO TABS: 75.0000 ug | ORAL_TABLET | Freq: Every day | ORAL | 3 refills | Status: DC

## 2018-08-04 MED ORDER — AMLODIPINE BESYLATE 5 MG PO TABS
5.0000 mg | ORAL_TABLET | Freq: Every day | ORAL | 3 refills | Status: DC
Start: 2018-08-04 — End: 2020-02-06

## 2018-09-04 ENCOUNTER — Other Ambulatory Visit: Payer: Self-pay

## 2018-09-04 ENCOUNTER — Encounter: Payer: Self-pay | Admitting: Anesthesiology

## 2018-09-04 DIAGNOSIS — K9423 Gastrostomy malfunction: Secondary | ICD-10-CM

## 2018-09-07 ENCOUNTER — Encounter
Admission: RE | Disposition: A | Discharge status: Home / Self Care | Payer: Self-pay | Source: Home / Self Care | Attending: Gastroenterology

## 2018-09-07 ENCOUNTER — Ambulatory Visit
Admission: RE | Admit: 2018-09-07 | Discharge: 2018-09-07 | Disposition: A | Discharge status: Home / Self Care | Payer: Medicare HMO | Attending: Gastroenterology | Admitting: Gastroenterology

## 2018-09-07 DIAGNOSIS — K219 Gastro-esophageal reflux disease without esophagitis: Secondary | ICD-10-CM | POA: Diagnosis not present

## 2018-09-07 DIAGNOSIS — Z85819 Personal history of malignant neoplasm of unspecified site of lip, oral cavity, and pharynx: Secondary | ICD-10-CM | POA: Diagnosis not present

## 2018-09-07 DIAGNOSIS — Z79899 Other long term (current) drug therapy: Secondary | ICD-10-CM | POA: Diagnosis not present

## 2018-09-07 DIAGNOSIS — Z9221 Personal history of antineoplastic chemotherapy: Secondary | ICD-10-CM | POA: Insufficient documentation

## 2018-09-07 DIAGNOSIS — Z923 Personal history of irradiation: Secondary | ICD-10-CM | POA: Diagnosis not present

## 2018-09-07 DIAGNOSIS — K9423 Gastrostomy malfunction: Secondary | ICD-10-CM | POA: Diagnosis present

## 2018-09-07 DIAGNOSIS — Z79890 Hormone replacement therapy (postmenopausal): Secondary | ICD-10-CM | POA: Diagnosis not present

## 2018-09-07 DIAGNOSIS — E079 Disorder of thyroid, unspecified: Secondary | ICD-10-CM | POA: Insufficient documentation

## 2018-09-07 DIAGNOSIS — I10 Essential (primary) hypertension: Secondary | ICD-10-CM | POA: Diagnosis not present

## 2018-09-07 HISTORY — PX: PEG PLACEMENT: SHX5437

## 2018-09-07 SURGERY — INSERTION, PEG TUBE
Anesthesia: Choice | Site: Abdomen

## 2018-09-07 SURGICAL SUPPLY — 1 items: MIC-KEY Gastromstomy feeding tube ×3 IMPLANT

## 2018-09-07 NOTE — H&P (Signed)
Crystal Lame, MD [facility] [address], [place], [place] [address] Phone:[phone] Fax : [phone]  Primary Care Physician:  Crystal Hess, MD Primary Gastroenterologist:  Dr. Allen Kent  Pre-Procedure History & Physical: HPI:  Crystal Kent is a 73 y.o. female is here for an PEG change.   Past Medical History:  Diagnosis Date  . Acid reflux   . 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 Difficult intubation    Pt reports "small airway"  . Hypertension   . Personal history of chemotherapy   . Personal history of radiation therapy   . Thyroid disease   . Uses prosthesis    plate to cover hole in roof of mouth s/p cancer surgery, doesn't wear anymore    Past Surgical History:  Procedure Laterality Date  . ABDOMINAL HYSTERECTOMY    . BREAST BIOPSY Left    bx/clip-neg  . hemimandibulectomy Right 1998  . MYRINGOTOMY WITH TUBE PLACEMENT Right 02/27/2017   Procedure: MYRINGOTOMY WITH TUBE PLACEMENT Right ear.;  Surgeon: Margaretha Sheffield, MD;  Location: [place];  Service: ENT;  Laterality: Right;  . partial maxillectomy Right   . PEG PLACEMENT N/A 05/27/2017   Procedure: PERCUTANEOUS ENDOSCOPIC GASTROSTOMY (PEG) PLACEMENT;  Surgeon: Crystal Lame, MD;  Location: [facility];  Service: Endoscopy;  Laterality: N/A;  . RADICAL NECK DISSECTION Right 1998   multiple oral cancers    Prior to Admission medications   Medication Sig Start Date End Date Taking? Authorizing Provider  acetaminophen (TYLENOL) 325 MG tablet Take 650 mg by mouth every 6 (six) hours as needed.   Yes [provider]  albuterol (PROVENTIL HFA;VENTOLIN HFA) 108 (90 Base) MCG/ACT inhaler Inhale 2 puffs into the lungs every 6 (six) hours as needed for wheezing or shortness of breath. 07/28/18  Yes Crystal Hess, MD  amLODipine (NORVASC) 5 MG tablet Take 1 tablet (5 mg total) by mouth daily. 08/04/18  Yes Crystal Hess, MD  Calcium Carbonate-Vit D-Min (CALCIUM 1200 PO) Take  by mouth daily.   Yes [provider]  fluticasone (FLONASE) 50 MCG/ACT nasal spray Place 2 sprays into both nostrils daily as needed. 04/27/13  Yes [provider]  levothyroxine (SYNTHROID, LEVOTHROID) 75 MCG tablet Take 1 tablet (75 mcg total) by mouth daily. 08/04/18  Yes Crystal Hess, MD  omeprazole (PRILOSEC) 20 MG capsule TAKE 1 CAPSULE EVERY DAY 12/30/17  Yes Crystal Hess, MD  benzonatate (TESSALON) 100 MG capsule Take 1 capsule (100 mg total) by mouth 3 (three) times daily. Patient not taking: Reported on 09/04/2018 07/28/18   Crystal Hess, MD  brompheniramine-pseudoephedrine-DM 30-2-10 MG/5ML syrup Take 2.5 mLs by mouth 4 (four) times daily as needed. Patient not taking: Reported on 07/28/2018 07/25/18   Crystal Picket, PA-C    Allergies as of 09/04/2018  . (No Known Allergies)    Family History  Problem Relation Age of Onset  . Alzheimer's disease Mother   . Heart attack Father   . Healthy Son   . Healthy Son   . Breast cancer Neg Hx     Social History   Socioeconomic History  . Marital status: Married    Spouse name: Not on file  . Number of children: 3  . Years of education: Not on file  . Highest education level: 12th grade  Occupational History  . Occupation: Retired  Scientific laboratory technician  . Financial resource strain: Not hard at all  . Food insecurity:  Worry: Never true    Inability: Never true  . Transportation needs:    Medical: No    Non-medical: No  Tobacco Use  . Smoking status: Never Smoker  . Smokeless tobacco: Never Used  . Tobacco comment: Smoking cessation materials not required  Substance and Sexual Activity  . Alcohol use: No  . Drug use: No  . Sexual activity: Not Currently  Lifestyle  . Physical activity:    Days per week: 0 days    Minutes per session: 0 min  . Stress: Not at all  Relationships  . Social connections:    Talks on phone: Patient refused    Gets together: Patient refused    Attends religious  service: Patient refused    Active member of club or organization: Patient refused    Attends meetings of clubs or organizations: Patient refused    Relationship status: Married  . Intimate partner violence:    Fear of current or ex partner: No    Emotionally abused: No    Physically abused: No    Forced sexual activity: No  Other Topics Concern  . Not on file  Social History Narrative  . Not on file    Review of Systems: See HPI, otherwise negative ROS  Physical Exam: BP (!) 162/89   Pulse 81   Temp 98.1 F (36.7 C) (Temporal)   Resp 16   Ht 4\' 11"  (1.499 m)   Wt 47.2 kg   SpO2 100%   BMI 21.01 kg/m  General:   Alert,  pleasant and cooperative in NAD Head:  Normocephalic and atraumatic. Neck:  Supple; no masses or thyromegaly. Lungs:  Clear throughout to auscultation.    Heart:  Regular rate and rhythm. Abdomen:  Soft, nontender and nondistended. Normal bowel sounds, without guarding, and without rebound.   Neurologic:  Alert and  oriented x4;  grossly normal neurologically.  Impression/Plan: Elizbeth Kent is here for an PEG change to be performed for PEG change  Risks, benefits, limitations, and alternatives regarding  PEG change have been reviewed with the patient.  Questions have been answered.  All parties agreeable.   Crystal Lame, MD  09/07/2018, 10:07 AM

## 2018-09-07 NOTE — Op Note (Signed)
[facility] Patient Name: Crystal Kent Procedure Date: 09/07/2018 10:07 AM MRN: [id number] Account #: [id number] Date of Birth: 03/17/1946 Admit Type: Outpatient Age: 73 Room: [facility] OR ROOM 01 Gender: Female Note Status: Finalized Procedure:            Non-endoscopic Tube Procedure Indications:          Replace PEG tube due to malfunctioning gastrostomy tube Providers:            Lucilla Lame MD, MD Medicines:            None Complications:        No immediate complications. Procedure:            Pre-Anesthesia Assessment:                       - Prior to the procedure, a History and Physical was                        performed, and patient medications and allergies were                        reviewed. The patient's tolerance of previous                        anesthesia was also reviewed. The risks and benefits of                        the procedure and the sedation options and risks were                        discussed with the patient. All questions were                        answered, and informed consent was obtained. Prior                        Anticoagulants: The patient has taken no previous                        anticoagulant or antiplatelet agents. ASA Grade                        Assessment: III - A patient with severe systemic                        disease. After reviewing the risks and benefits, the                        patient was deemed in satisfactory condition to undergo                        the procedure.                       After obtaining informed consent, the site was prepped                        and the procedure was performed.The upper GI endoscopy  was accomplished without difficulty. The patient                        tolerated the procedure well. After obtaining informed                        consent, the site was prepped and the procedure was                         performed. Findings:      A 20 Fr. 2cm low profile PEG was removed and a new one placed. The       balloon was nflated with 2cc of fluid. Impression:           . - A low prifile PEG tube change. Recommendation:       - Discharge patient to home.                       - Resume previous diet.                       - Continue present medications. Diagnosis Code(s):    --- Professional ---                       I43.32, Gastrostomy malfunction Lucilla Lame MD, MD 09/07/2018 10:20:45 AM This report has been signed electronically. Number of Addenda: 0 Note Initiated On: 09/07/2018 10:07 AM      [facility] [place]

## 2018-09-08 ENCOUNTER — Ambulatory Visit
Admission: RE | Admit: 2018-09-08 | Discharge: 2018-09-08 | Disposition: A | Discharge status: Home / Self Care | Payer: Medicare HMO | Source: Ambulatory Visit | Attending: Internal Medicine | Admitting: Internal Medicine

## 2018-09-08 ENCOUNTER — Ambulatory Visit
Admission: RE | Admit: 2018-09-08 | Discharge: 2018-09-08 | Disposition: A | Discharge status: Home / Self Care | Payer: Medicare HMO | Attending: Internal Medicine | Admitting: Internal Medicine

## 2018-09-08 ENCOUNTER — Encounter: Payer: Self-pay | Admitting: Gastroenterology

## 2018-09-08 ENCOUNTER — Other Ambulatory Visit: Payer: Self-pay

## 2018-09-08 ENCOUNTER — Ambulatory Visit (INDEPENDENT_AMBULATORY_CARE_PROVIDER_SITE_OTHER): Payer: Medicare HMO | Admitting: Internal Medicine

## 2018-09-08 VITALS — BP 124/78 | HR 85 | Ht 59.0 in | Wt 103.0 lb

## 2018-09-08 DIAGNOSIS — J181 Lobar pneumonia, unspecified organism: Secondary | ICD-10-CM | POA: Insufficient documentation

## 2018-09-08 DIAGNOSIS — B370 Candidal stomatitis: Secondary | ICD-10-CM

## 2018-09-08 DIAGNOSIS — Z9622 Myringotomy tube(s) status: Secondary | ICD-10-CM | POA: Diagnosis not present

## 2018-09-08 DIAGNOSIS — J189 Pneumonia, unspecified organism: Secondary | ICD-10-CM

## 2018-09-08 DIAGNOSIS — C01 Malignant neoplasm of base of tongue: Secondary | ICD-10-CM

## 2018-09-08 NOTE — Progress Notes (Signed)
Date:  09/08/2018   Name:  Crystal Kent   DOB:  [birth year]   MRN:  [id number]   Chief Complaint: Pneumonia (Chest XR follow up.)  Pneumonia  There is no cough, difficulty breathing, shortness of breath, sputum production or wheezing. The problem has been resolved. Associated symptoms include trouble swallowing (takes nothing by mouth). Pertinent negatives include no chest pain, ear pain, fever or headaches. Relieved by: treated with course of antibiotics and now feels like chest sx have completely resolved.   Oral lesion - she has noticed more white/yellow phlegm in her mouth and throat since antibiotics.  She has had thrush in the past and was treated with nystatin via tube.  She refilled the medication yesterday and already feels better today.  Ear fullness - she has noticed some fullness in her right ear.  No pain or change in hearing.  She has had tubes in that ear for years.   Review of Systems  Constitutional: Negative for chills, fatigue and fever.  HENT: Positive for mouth sores and trouble swallowing (takes nothing by mouth). Negative for ear pain and hearing loss.   Eyes: Negative for visual disturbance.  Respiratory: Negative for cough, sputum production, chest tightness, shortness of breath and wheezing.   Cardiovascular: Negative for chest pain and palpitations.  Gastrointestinal: Negative for abdominal pain.  Neurological: Negative for dizziness and headaches.    Patient Active Problem List   Diagnosis Date Noted  . Mechanical complication of gastrostomy ([place])   . Tinea corporis 12/29/2017  . Insomnia 04/23/2017  . 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 Hypertension 06/28/2016  . Allergic rhinitis 06/28/2016  . Current use of proton pump inhibitor 06/28/2016  . Gastrostomy in place [facility]) 05/02/2016  . Chronic pansinusitis 02/13/2016  . Eustachian tube dysfunction 12/14/2014  . Hearing loss 12/14/2014  . Lesion of buccal mucosa 12/14/2014  .  Dysphagia, oropharyngeal 05/23/2014  . Hypothyroidism 04/27/2014  . 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 ABDOMINAL HYSTERECTOMY    . BREAST BIOPSY Left    bx/clip-neg  . hemimandibulectomy Right 1998  . MYRINGOTOMY WITH TUBE PLACEMENT Right 02/27/2017   Procedure: MYRINGOTOMY WITH TUBE PLACEMENT Right ear.;  Surgeon: Margaretha Sheffield, MD;  Location: [place];  Service: ENT;  Laterality: Right;  . partial maxillectomy Right   . PEG PLACEMENT N/A 05/27/2017   Procedure: PERCUTANEOUS ENDOSCOPIC GASTROSTOMY (PEG) PLACEMENT;  Surgeon: Lucilla Lame, MD;  Location: [facility] ENDOSCOPY;  Service: Endoscopy;  Laterality: N/A;  . PEG PLACEMENT N/A 09/07/2018   Procedure: PERCUTANEOUS ENDOSCOPIC GASTROSTOMY (PEG) PLACEMENT;  Surgeon: Lucilla Lame, MD;  Location: [place];  Service: Endoscopy;  Laterality: N/A;  . RADICAL NECK DISSECTION Right 1998   multiple oral cancers    Social History   Tobacco Use  . Smoking status: Never Smoker  . Smokeless tobacco: Never Used  . Tobacco comment: Smoking cessation materials not required  Substance Use Topics  . Alcohol use: No  . Drug use: No     Medication list has been reviewed and updated.  Current Meds  Medication Sig  . acetaminophen (TYLENOL) 325 MG tablet Take 650 mg by mouth every 6 (six) hours as needed.  Marland Kitchen albuterol (PROVENTIL HFA;VENTOLIN HFA) 108 (90 Base) MCG/ACT inhaler Inhale 2 puffs into the lungs every 6 (six) hours  as needed for wheezing or shortness of breath.  Marland Kitchen amLODipine (NORVASC) 5 MG tablet Take 1 tablet (5 mg total) by mouth daily.  . Calcium Carbonate-Vit D-Min (CALCIUM 1200 PO) Take by mouth daily.  . fluticasone (FLONASE) 50 MCG/ACT nasal spray Place 2 sprays into both nostrils daily as needed.  Marland Kitchen levothyroxine  (SYNTHROID, LEVOTHROID) 75 MCG tablet Take 1 tablet (75 mcg total) by mouth daily.  Marland Kitchen omeprazole (PRILOSEC) 20 MG capsule TAKE 1 CAPSULE EVERY DAY    PHQ 2/9 Scores 09/08/2018 12/10/2017 12/09/2016 12/09/2016  PHQ - 2 Score 0 0 0 0  PHQ- 9 Score - 0 0 -    Physical Exam Vitals signs and nursing note reviewed.  Constitutional:      General: She is not in acute distress.    Appearance: She is well-developed.  HENT:     Head:     Comments: Post operative jaw and oral changes noted    Ears:     Comments: Right canal obscured by cerumen Myringotomy tube apparently in the ear canal and not in the TM    Mouth/Throat:     Comments: Soft palate surgically absent Ulcerations and yellow exudate in the right posterior region, large defect noted without bleeding on the right Cardiovascular:     Rate and Rhythm: Normal rate and regular rhythm.     Pulses: Normal pulses.     Heart sounds: Normal heart sounds.  Pulmonary:     Effort: Pulmonary effort is normal. No respiratory distress.     Breath sounds: Normal breath sounds. No wheezing, rhonchi or rales.  Musculoskeletal: Normal range of motion.  Lymphadenopathy:     Cervical: No cervical adenopathy.  Skin:    General: Skin is warm and dry.     Findings: No rash.  Neurological:     Mental Status: She is alert and oriented to person, place, and time.  Psychiatric:        Behavior: Behavior normal.        Thought Content: Thought content normal.     BP 124/78   Pulse 85   Ht 4\' 11"  (1.499 m)   Wt 103 lb (46.7 kg)   SpO2 97%   BMI 20.80 kg/m   Assessment and Plan: 1. Community acquired pneumonia of right lower lobe of lung ([name]) Repeat CXR; appears clinically resolved - DG Chest 2 View; Future  2. Malignant neoplasm of base of tongue ([name]) Has follow up with ENT tomorrow  3. Thrush Continue nystatin via PEG  4. History of placement of ear tubes See ENT to clean ear canal and determine if tube needs to be  replaced   Partially dictated using Dragon software. Any errors are unintentional.  Crystal Maidens, MD [facility]  09/08/2018

## 2018-09-09 ENCOUNTER — Ambulatory Visit: Payer: Self-pay | Admitting: Internal Medicine

## 2018-09-09 NOTE — Progress Notes (Signed)
Called and informed patient of left lung pneumonia cleared.

## 2018-09-15 ENCOUNTER — Other Ambulatory Visit: Payer: Self-pay

## 2018-09-15 ENCOUNTER — Telehealth: Payer: Self-pay

## 2018-09-15 MED ORDER — FLUCONAZOLE 150 MG PO TABS: 150.0000 mg | ORAL_TABLET | Freq: Once | ORAL | 0 refills | Status: AC

## 2018-09-15 NOTE — Telephone Encounter (Signed)
Patient husband called saying wife has yeast infection for abx. [place] in diflucan for patient. Called back and informed husband where sent.

## 2018-10-09 ENCOUNTER — Other Ambulatory Visit: Payer: Self-pay

## 2018-10-09 ENCOUNTER — Encounter: Payer: Self-pay | Admitting: Internal Medicine

## 2018-10-09 ENCOUNTER — Ambulatory Visit: Payer: Self-pay | Admitting: Internal Medicine

## 2018-10-09 ENCOUNTER — Ambulatory Visit (INDEPENDENT_AMBULATORY_CARE_PROVIDER_SITE_OTHER): Payer: Medicare HMO | Admitting: Internal Medicine

## 2018-10-09 VITALS — BP 122/76 | HR 89 | Temp 96.7°F | Ht 59.0 in | Wt 107.0 lb

## 2018-10-09 DIAGNOSIS — J0100 Acute maxillary sinusitis, unspecified: Secondary | ICD-10-CM | POA: Diagnosis not present

## 2018-10-09 MED ORDER — MECLIZINE HCL 12.5 MG PO TABS: 12.5000 mg | ORAL_TABLET | Freq: Three times a day (TID) | ORAL | 0 refills | Status: DC | PRN

## 2018-10-09 MED ORDER — LORATADINE 10 MG PO TABS: 10.0000 mg | ORAL_TABLET | Freq: Every day | ORAL | 5 refills | Status: DC

## 2018-10-09 MED ORDER — AZITHROMYCIN 200 MG/5ML PO SUSR: ORAL | 0 refills | Status: AC

## 2018-10-09 NOTE — Progress Notes (Signed)
Date:  10/09/2018   Name:  Crystal Kent   DOB:  01-09-1946   MRN:  [id number]   Chief Complaint: Cough (X 1 week. Yellow mucous. Chills. NO fever. No muscle pains.)  Sinus Problem  This is a new problem. The current episode started in the past 7 days. The problem is unchanged. There has been no fever. Associated symptoms include chills, congestion, coughing, ear pain and sinus pressure. Pertinent negatives include no sneezing or sore throat. Past treatments include nothing.    Review of Systems  Constitutional: Positive for chills.  HENT: Positive for congestion, ear pain and sinus pressure. Negative for sneezing and sore throat.   Respiratory: Positive for cough. Negative for chest tightness and wheezing.   Cardiovascular: Negative for chest pain, palpitations and leg swelling.    Patient Active Problem List   Diagnosis Date Noted  . Mechanical complication of gastrostomy ([place])   . Oral candida 03/16/2018  . Tinea corporis 12/29/2017  . Insomnia 04/23/2017  . 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 Hypertension 06/28/2016  . Allergic rhinitis 06/28/2016  . Current use of proton pump inhibitor 06/28/2016  . Gastrostomy in place [facility]) 05/02/2016  . Chronic pansinusitis 02/13/2016  . Eustachian tube dysfunction 12/14/2014  . Hearing loss 12/14/2014  . Lesion of buccal mucosa 12/14/2014  . Dysphagia, oropharyngeal 05/23/2014  . Hypothyroidism 04/27/2014  . 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 ABDOMINAL HYSTERECTOMY    . BREAST BIOPSY Left    bx/clip-neg  . hemimandibulectomy Right 1998  . MYRINGOTOMY WITH TUBE PLACEMENT Right 02/27/2017   Procedure: MYRINGOTOMY WITH TUBE PLACEMENT Right ear.;  Surgeon: Margaretha Sheffield, MD;  Location: [place];  Service: ENT;   Laterality: Right;  . partial maxillectomy Right   . PEG PLACEMENT N/A 05/27/2017   Procedure: PERCUTANEOUS ENDOSCOPIC GASTROSTOMY (PEG) PLACEMENT;  Surgeon: Lucilla Lame, MD;  Location: [facility] ENDOSCOPY;  Service: Endoscopy;  Laterality: N/A;  . PEG PLACEMENT N/A 09/07/2018   Procedure: PERCUTANEOUS ENDOSCOPIC GASTROSTOMY (PEG) PLACEMENT;  Surgeon: Lucilla Lame, MD;  Location: [place];  Service: Endoscopy;  Laterality: N/A;  . RADICAL NECK DISSECTION Right 1998   multiple oral cancers    Social History   Tobacco Use  . Smoking status: Never Smoker  . Smokeless tobacco: Never Used  . Tobacco comment: Smoking cessation materials not required  Substance Use Topics  . Alcohol use: No  . Drug use: No     Medication list has been reviewed and updated.  Current Meds  Medication Sig  . acetaminophen (TYLENOL) 325 MG tablet Take 650 mg by mouth every 6 (six) hours as needed.  Marland Kitchen albuterol (PROVENTIL HFA;VENTOLIN HFA) 108 (90 Base) MCG/ACT inhaler Inhale 2 puffs into the lungs every 6 (six) hours as needed for wheezing or shortness of breath.  Marland Kitchen amLODipine (NORVASC) 5 MG tablet Take 1 tablet (5 mg total) by mouth daily.  . Calcium Carbonate-Vit D-Min (CALCIUM 1200 PO) Take by mouth daily.  . fluticasone (FLONASE) 50 MCG/ACT nasal spray Place 2 sprays into both nostrils daily as needed.  Marland Kitchen levothyroxine (SYNTHROID, LEVOTHROID) 75 MCG tablet Take 1 tablet (75 mcg total) by mouth daily.  Marland Kitchen omeprazole (PRILOSEC) 20 MG capsule TAKE 1 CAPSULE EVERY DAY    PHQ 2/9 Scores 10/09/2018 09/08/2018 12/10/2017 12/09/2016  PHQ -  2 Score 0 0 0 0  PHQ- 9 Score - - 0 0    Physical Exam Constitutional:      Appearance: She is well-developed.  HENT:     Right Ear: Ear canal and external ear normal. Tympanic membrane is not erythematous or retracted.     Left Ear: Ear canal and external ear normal. There is impacted cerumen.     Nose:     Right Sinus: Maxillary sinus tenderness present. No  frontal sinus tenderness.     Left Sinus: Maxillary sinus tenderness present. No frontal sinus tenderness.     Mouth/Throat:     Mouth: No oral lesions.  Cardiovascular:     Rate and Rhythm: Normal rate and regular rhythm.     Heart sounds: Normal heart sounds.  Pulmonary:     Effort: Pulmonary effort is normal.     Breath sounds: Normal breath sounds. No decreased breath sounds, wheezing, rhonchi or rales.     Comments: Loose cough Lymphadenopathy:     Cervical: No cervical adenopathy.  Neurological:     Mental Status: She is alert and oriented to person, place, and time.     Wt Readings from Last 3 Encounters:  10/09/18 107 lb (48.5 kg)  09/08/18 103 lb (46.7 kg)  09/07/18 104 lb (47.2 kg)    BP 122/76   Pulse 89   Temp (!) 96.7 F (35.9 C) (Oral)   Ht 4\' 11"  (1.499 m)   Wt 107 lb (48.5 kg)   SpO2 95%   BMI 21.61 kg/m   Assessment and Plan: 1. Acute non-recurrent maxillary sinusitis Increase fluids - meclizine (ANTIVERT) 12.5 MG tablet; Place 1 tablet (12.5 mg total) into feeding tube 3 (three) times daily as needed for dizziness.  Dispense: 30 tablet; Refill: 0 - loratadine (CLARITIN) 10 MG tablet; Place 1 tablet (10 mg total) into feeding tube daily.  Dispense: 30 tablet; Refill: 5 - azithromycin (ZITHROMAX) 200 MG/5ML suspension; Place 12.5 mLs (500 mg total) into feeding tube daily for 1 day, THEN 6.3 mLs (250 mg total) daily for 4 days.  Dispense: 40 mL; Refill: 0   Partially dictated using Editor, commissioning. Any errors are unintentional.  Halina Maidens, MD [facility]  10/09/2018

## 2018-10-22 ENCOUNTER — Other Ambulatory Visit: Payer: Self-pay | Admitting: Internal Medicine

## 2018-10-22 ENCOUNTER — Telehealth: Payer: Self-pay | Admitting: Internal Medicine

## 2018-10-22 DIAGNOSIS — R0981 Nasal congestion: Secondary | ICD-10-CM

## 2018-10-22 MED ORDER — PREDNISONE 10 MG PO TABS: 10.0000 mg | ORAL_TABLET | ORAL | 0 refills | Status: AC

## 2018-10-22 NOTE — Telephone Encounter (Signed)
Crystal Kent called and said they tried the benadryl and it did not work. Patient is requesting Z-Pak

## 2018-10-22 NOTE — Telephone Encounter (Signed)
Patient agrees to try prednisone in place of Zpack for her ear pain/ swelling and eye pain and face pressure. No cough, fever, or congestion.

## 2018-11-03 ENCOUNTER — Other Ambulatory Visit: Payer: Self-pay | Admitting: Internal Medicine

## 2018-11-30 DIAGNOSIS — K117 Disturbances of salivary secretion: Secondary | ICD-10-CM | POA: Diagnosis not present

## 2018-12-16 ENCOUNTER — Ambulatory Visit (INDEPENDENT_AMBULATORY_CARE_PROVIDER_SITE_OTHER): Payer: Medicare HMO

## 2018-12-16 VITALS — BP 146/95 | HR 94 | Ht 59.0 in | Wt 104.0 lb

## 2018-12-16 DIAGNOSIS — Z0000 Encounter for general adult medical examination without abnormal findings: Secondary | ICD-10-CM

## 2018-12-16 DIAGNOSIS — Z1231 Encounter for screening mammogram for malignant neoplasm of breast: Secondary | ICD-10-CM | POA: Diagnosis not present

## 2018-12-16 NOTE — Patient Instructions (Signed)
Crystal Kent , Thank you for taking time to come for your Medicare Wellness Visit. I appreciate your ongoing commitment to your health goals. Please review the following plan we discussed and let me know if I can assist you in the future.   Screening recommendations/referrals: Colonoscopy: done 2013 no longer required Mammogram: done 12/25/17. Please call [phone] to schedule your mammogram.  Bone Density: done 12/25/17 Recommended yearly ophthalmology/optometry visit for glaucoma screening and checkup Recommended yearly dental visit for hygiene and checkup  Vaccinations: Influenza vaccine: done 1010/19 Pneumococcal vaccine: done 2013 Tdap vaccine: done 04/23/17 Shingles vaccine: Shingrix discussed. Please contact your pharmacy for coverage information.   Advanced directives: Advance directive discussed with you today. I have mailed a copy for you to complete at home and have notarized. Once this is complete please bring a copy in to our office so we can scan it into your chart.  Conditions/risks identified: Continue adequate nutrition and physical activity.  Next appointment: Please follow up in one year for your Medicare Annual Wellness visit.     Preventive Care 33 Years and Older, Female Preventive care refers to lifestyle choices and visits with your health care provider that can promote health and wellness. What does preventive care include?  A yearly physical exam. This is also called an annual well check.  Dental exams once or twice a year.  Routine eye exams. Ask your health care provider how often you should have your eyes checked.  Personal lifestyle choices, including:  Daily care of your teeth and gums.  Regular physical activity.  Eating a healthy diet.  Avoiding tobacco and drug use.  Limiting alcohol use.  Practicing safe sex.  Taking low-dose aspirin every day.  Taking vitamin and mineral supplements as recommended by your health care provider. What  happens during an annual well check? The services and screenings done by your health care provider during your annual well check will depend on your age, overall health, lifestyle risk factors, and family history of disease. Counseling  Your health care provider may ask you questions about your:  Alcohol use.  Tobacco use.  Drug use.  Emotional well-being.  Home and relationship well-being.  Sexual activity.  Eating habits.  History of falls.  Memory and ability to understand (cognition).  Work and work Statistician.  Reproductive health. Screening  You may have the following tests or measurements:  Height, weight, and BMI.  Blood pressure.  Lipid and cholesterol levels. These may be checked every 5 years, or more frequently if you are over 80 years old.  Skin check.  Lung cancer screening. You may have this screening every year starting at age 73 if you have a 30-pack-year history of smoking and currently smoke or have quit within the past 15 years.  Fecal occult blood test (FOBT) of the stool. You may have this test every year starting at age 77.  Flexible sigmoidoscopy or colonoscopy. You may have a sigmoidoscopy every 5 years or a colonoscopy every 10 years starting at age 28.  Hepatitis C blood test.  Hepatitis B blood test.  Sexually transmitted disease (STD) testing.  Diabetes screening. This is done by checking your blood sugar (glucose) after you have not eaten for a while (fasting). You may have this done every 1-3 years.  Bone density scan. This is done to screen for osteoporosis. You may have this done starting at age 66.  Mammogram. This may be done every 1-2 years. Talk to your health care provider about how  often you should have regular mammograms. Talk with your health care provider about your test results, treatment options, and if necessary, the need for more tests. Vaccines  Your health care provider may recommend certain vaccines, such as:   Influenza vaccine. This is recommended every year.  Tetanus, diphtheria, and acellular pertussis (Tdap, Td) vaccine. You may need a Td booster every 10 years.  Zoster vaccine. You may need this after age 50.  Pneumococcal 13-valent conjugate (PCV13) vaccine. One dose is recommended after age 43.  Pneumococcal polysaccharide (PPSV23) vaccine. One dose is recommended after age 24. Talk to your health care provider about which screenings and vaccines you need and how often you need them. This information is not intended to replace advice given to you by your health care provider. Make sure you discuss any questions you have with your health care provider. Document Released: 08/04/2015 Document Revised: 03/27/2016 Document Reviewed: 05/09/2015 Elsevier Interactive Patient Education  2017 [place] Prevention in the Home Falls can cause injuries. They can happen to people of all ages. There are many things you can do to make your home safe and to help prevent falls. What can I do on the outside of my home?  Regularly fix the edges of walkways and driveways and fix any cracks.  Remove anything that might make you trip as you walk through a door, such as a raised step or threshold.  Trim any bushes or trees on the path to your home.  Use bright outdoor lighting.  Clear any walking paths of anything that might make someone trip, such as rocks or tools.  Regularly check to see if handrails are loose or broken. Make sure that both sides of any steps have handrails.  Any raised decks and porches should have guardrails on the edges.  Have any leaves, snow, or ice cleared regularly.  Use sand or salt on walking paths during winter.  Clean up any spills in your garage right away. This includes oil or grease spills. What can I do in the bathroom?  Use night lights.  Install grab bars by the toilet and in the tub and shower. Do not use towel bars as grab bars.  Use non-skid mats  or decals in the tub or shower.  If you need to sit down in the shower, use a plastic, non-slip stool.  Keep the floor dry. Clean up any water that spills on the floor as soon as it happens.  Remove soap buildup in the tub or shower regularly.  Attach bath mats securely with double-sided non-slip rug tape.  Do not have throw rugs and other things on the floor that can make you trip. What can I do in the bedroom?  Use night lights.  Make sure that you have a light by your bed that is easy to reach.  Do not use any sheets or blankets that are too big for your bed. They should not hang down onto the floor.  Have a firm chair that has side arms. You can use this for support while you get dressed.  Do not have throw rugs and other things on the floor that can make you trip. What can I do in the kitchen?  Clean up any spills right away.  Avoid walking on wet floors.  Keep items that you use a lot in easy-to-reach places.  If you need to reach something above you, use a strong step stool that has a grab bar.  Keep electrical  cords out of the way.  Do not use floor polish or wax that makes floors slippery. If you must use wax, use non-skid floor wax.  Do not have throw rugs and other things on the floor that can make you trip. What can I do with my stairs?  Do not leave any items on the stairs.  Make sure that there are handrails on both sides of the stairs and use them. Fix handrails that are broken or loose. Make sure that handrails are as long as the stairways.  Check any carpeting to make sure that it is firmly attached to the stairs. Fix any carpet that is loose or worn.  Avoid having throw rugs at the top or bottom of the stairs. If you do have throw rugs, attach them to the floor with carpet tape.  Make sure that you have a light switch at the top of the stairs and the bottom of the stairs. If you do not have them, ask someone to add them for you. What else can I do to  help prevent falls?  Wear shoes that:  Do not have high heels.  Have rubber bottoms.  Are comfortable and fit you well.  Are closed at the toe. Do not wear sandals.  If you use a stepladder:  Make sure that it is fully opened. Do not climb a closed stepladder.  Make sure that both sides of the stepladder are locked into place.  Ask someone to hold it for you, if possible.  Clearly mark and make sure that you can see:  Any grab bars or handrails.  First and last steps.  Where the edge of each step is.  Use tools that help you move around (mobility aids) if they are needed. These include:  Canes.  Walkers.  Scooters.  Crutches.  Turn on the lights when you go into a dark area. Replace any light bulbs as soon as they burn out.  Set up your furniture so you have a clear path. Avoid moving your furniture around.  If any of your floors are uneven, fix them.  If there are any pets around you, be aware of where they are.  Review your medicines with your doctor. Some medicines can make you feel dizzy. This can increase your chance of falling. Ask your doctor what other things that you can do to help prevent falls. This information is not intended to replace advice given to you by your health care provider. Make sure you discuss any questions you have with your health care provider. Document Released: 05/04/2009 Document Revised: 12/14/2015 Document Reviewed: 08/12/2014 Elsevier Interactive Patient Education  2017 [employer].

## 2018-12-16 NOTE — Progress Notes (Signed)
Subjective:   Crystal Kent is a 73 y.o. female who presents for Medicare Annual (Subsequent) preventive examination.  Virtual Visit via Telephone Note  I connected with Crystal Kent on 12/16/18 at  1:20 PM EDT by telephone and verified that I am speaking with the correct person using two identifiers.  Medicare Annual Wellness visit completed telephonically due to Covid-19 pandemic.   Location: Patient: home Provider: office   I discussed the limitations, risks, security and privacy concerns of performing an evaluation and management service by telephone and the availability of in person appointments. The patient expressed understanding and agreed to proceed.  Some vital signs may be absent or patient reported.     Clemetine Marker, LPN  Review of Systems:   Cardiac Risk Factors include: advanced age (>50men, >82 women);hypertension     Objective:     Vitals: BP (!) 146/95   Pulse 94   Ht 4\' 11"  (1.499 m)   Wt 104 lb (47.2 kg)   BMI 21.01 kg/m   Body mass index is 21.01 kg/m.  Advanced Directives 12/16/2018 07/25/2018 02/23/2018 12/10/2017 04/23/2017 02/27/2017 12/09/2016  Does Patient Have a Medical Advance Directive? No No No No No No No  Would patient like information on creating a medical advance directive? Yes (MAU/Ambulatory/Procedural Areas - Information given) - - Yes (MAU/Ambulatory/Procedural Areas - Information given) - No - Patient declined No - Patient declined    Tobacco Social History   Tobacco Use  Smoking Status Never Smoker  Smokeless Tobacco Never Used  Tobacco Comment   Smoking cessation materials not required     Counseling given: Not Answered Comment: Smoking cessation materials not required   Clinical Intake:  Pre-visit preparation completed: Yes  Pain : No/denies pain     BMI - recorded: 21.01 Nutritional Status: BMI of 19-24  Normal Nutritional Risks: None Diabetes: No  How often do you need to have someone help  you when you read instructions, pamphlets, or other written materials from your doctor or pharmacy?: 1 - Never  Interpreter Needed?: No  Information entered by :: Clemetine Marker LPN  Past Medical History:  Diagnosis Date  . Acid reflux   . Allergy   . 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 Difficult intubation    Pt reports "small airway"  . Hypertension   . Personal history of chemotherapy   . Personal history of radiation therapy   . Thyroid disease   . Uses prosthesis    plate to cover hole in roof of mouth s/p cancer surgery, doesn't wear anymore   Past Surgical History:  Procedure Laterality Date  . ABDOMINAL HYSTERECTOMY    . BREAST BIOPSY Left    bx/clip-neg  . hemimandibulectomy Right 1998  . MYRINGOTOMY WITH TUBE PLACEMENT Right 02/27/2017   Procedure: MYRINGOTOMY WITH TUBE PLACEMENT Right ear.;  Surgeon: Margaretha Sheffield, MD;  Location: [place];  Service: ENT;  Laterality: Right;  . partial maxillectomy Right   . PEG PLACEMENT N/A 05/27/2017   Procedure: PERCUTANEOUS ENDOSCOPIC GASTROSTOMY (PEG) PLACEMENT;  Surgeon: Lucilla Lame, MD;  Location: [facility];  Service: Endoscopy;  Laterality: N/A;  . PEG PLACEMENT N/A 09/07/2018   Procedure: PERCUTANEOUS ENDOSCOPIC GASTROSTOMY (PEG) PLACEMENT;  Surgeon: Lucilla Lame, MD;  Location: [place];  Service: Endoscopy;  Laterality: N/A;  . 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 Alzheimer's disease Mother   .  Heart attack Father   . Healthy Son   . Healthy Son   . Breast cancer Neg Hx    Social History   Socioeconomic History  . Marital status: Married    Spouse name: Not on file  . Number of children: 3  . Years of education: Not on file  . Highest education level: 12th grade  Occupational History  . Occupation: Retired  Scientific laboratory technician  . Financial resource strain: Not hard at all  . Food insecurity:    Worry: Never true     Inability: Never true  . Transportation needs:    Medical: No    Non-medical: No  Tobacco Use  . Smoking status: Never Smoker  . Smokeless tobacco: Never Used  . Tobacco comment: Smoking cessation materials not required  Substance and Sexual Activity  . Alcohol use: No  . Drug use: No  . Sexual activity: Not Currently  Lifestyle  . Physical activity:    Days per week: 7 days    Minutes per session: 30 min  . Stress: Not at all  Relationships  . Social connections:    Talks on phone: More than three times a week    Gets together: More than three times a week    Attends religious service: More than 4 times per year    Active member of club or organization: No    Attends meetings of clubs or organizations: Never    Relationship status: Married  Other Topics Concern  . Not on file  Social History Narrative  . Not on file    Outpatient Encounter Medications as of 12/16/2018  Medication Sig  . acetaminophen (TYLENOL) 325 MG tablet Take 650 mg by mouth every 6 (six) hours as needed.  Marland Kitchen albuterol (PROVENTIL HFA;VENTOLIN HFA) 108 (90 Base) MCG/ACT inhaler Inhale 2 puffs into the lungs every 6 (six) hours as needed for wheezing or shortness of breath.  Marland Kitchen amLODipine (NORVASC) 5 MG tablet Take 1 tablet (5 mg total) by mouth daily.  . Artificial Saliva (BIOTENE ORALBALANCE DRY MOUTH) GEL by Transmucosal route 4 (four) times daily.   . Calcium Carbonate-Vit D-Min (CALCIUM 1200 PO) Take by mouth daily.  . fluticasone (FLONASE) 50 MCG/ACT nasal spray Place 2 sprays into both nostrils daily as needed.  Marland Kitchen levothyroxine (SYNTHROID, LEVOTHROID) 75 MCG tablet Take 1 tablet (75 mcg total) by mouth daily.  Marland Kitchen loratadine (CLARITIN) 10 MG tablet Place 1 tablet (10 mg total) into feeding tube daily.  . meclizine (ANTIVERT) 12.5 MG tablet Place 1 tablet (12.5 mg total) into feeding tube 3 (three) times daily as needed for dizziness.  Marland Kitchen omeprazole (PRILOSEC) 20 MG capsule TAKE 1 CAPSULE EVERY DAY    No facility-administered encounter medications on file as of 12/16/2018.     Activities of Daily Living In your present state of health, do you have any difficulty performing the following activities: 12/16/2018 09/07/2018  Hearing? N N  Comment declines hearing aids -  Vision? N N  Comment wears glasses -  Difficulty concentrating or making decisions? N N  Walking or climbing stairs? N N  Dressing or bathing? N N  Doing errands, shopping? N -  Preparing Food and eating ? N -  Using the Toilet? N -  In the past six months, have you accidently leaked urine? N -  Do you have problems with loss of bowel control? N -  Managing your Medications? N -  Managing your Finances? N -  Housekeeping or managing  your Housekeeping? N -  Some recent data might be hidden    Patient Care Team: Glean Hess, MD as PCP - General (Internal Medicine) Margaretha Sheffield, MD as Consulting Physician (Otolaryngology) Lennox Grumbles, MD as Referring Physician (Otolaryngology)    Assessment:   This is a routine wellness examination for Deryl.  Exercise Activities and Dietary recommendations Current Exercise Habits: Home exercise routine, Time (Minutes): 30, Frequency (Times/Week): 7, Weekly Exercise (Minutes/Week): 210, Intensity: Mild, Exercise limited by: None identified  Goals    . DIET - INCREASE WATER INTAKE     Recommend to drink at least 6-8 8oz glasses of water per day.    . Prevent Falls     Fall preventions discussed, recommend good lighting, no area rugs, avoiding stairs.       Fall Risk Fall Risk  12/16/2018 10/09/2018 09/08/2018 12/10/2017 12/09/2016  Falls in the past year? 0 0 0 No Yes  Number falls in past yr: 0 0 0 - 1  Injury with Fall? 0 0 0 - Yes  Risk Factor Category  - - - - High Fall Risk  Risk for fall due to : - - - History of fall(s);Impaired vision -  Risk for fall due to: Comment - - - wears eyeglasses -  Follow up Falls prevention discussed Falls evaluation  completed Falls prevention discussed - Falls prevention discussed   FALL RISK PREVENTION PERTAINING TO THE HOME:  Any stairs in or around the home? Yes  If so, do they handrails? Yes   Home free of loose throw rugs in walkways, pet beds, electrical cords, etc? Yes  Adequate lighting in your home to reduce risk of falls? Yes   ASSISTIVE DEVICES UTILIZED TO PREVENT FALLS:  Life alert? No  Use of a cane, walker or w/c? No  Grab bars in the bathroom? No  Shower chair or bench in shower? No  Elevated toilet seat or a handicapped toilet? No   DME ORDERS:  DME order needed?  No   TIMED UP AND GO:  Was the test performed? No . Telephonic visit   Education: Fall risk prevention has been discussed.  Intervention(s) required? No   Depression Screen PHQ 2/9 Scores 12/16/2018 10/09/2018 09/08/2018 12/10/2017  PHQ - 2 Score 0 0 0 0  PHQ- 9 Score - - - 0     Cognitive Function not performed due to telephonic visit and pt's difficulty with speech     6CIT Screen 12/10/2017 12/09/2016  What Year? 0 points 0 points  What month? 0 points 0 points  What time? 0 points 0 points  Count back from 20 0 points 0 points  Months in reverse 0 points 0 points  Repeat phrase 0 points 0 points  Total Score 0 0    Immunization History  Administered Date(s) Administered  . Influenza, High Dose Seasonal PF 04/23/2017, 04/30/2018  . Influenza-Unspecified 04/21/2014  . Pneumococcal Conjugate-13 07/22/2008  . Pneumococcal Polysaccharide-23 07/23/2011  . Tdap 04/23/2017    Qualifies for Shingles Vaccine? Yes  Zostavax completed per patient. Due for Shingrix. Education has been provided regarding the importance of this vaccine. Pt has been advised to call insurance company to determine out of pocket expense. Advised may also receive vaccine at local pharmacy or Health Dept. Verbalized acceptance and understanding.  Tdap: Up to date  Flu Vaccine: Up to date  Pneumococcal Vaccine: Up to date    Screening Tests Health Maintenance  Topic Date Due  . MAMMOGRAM  12/26/2018  .  INFLUENZA VACCINE  02/20/2019  . COLONOSCOPY  07/22/2021  . TETANUS/TDAP  04/24/2027  . DEXA SCAN  Completed  . Hepatitis C Screening  Completed  . PNA vac Low Risk Adult  Completed    Cancer Screenings:  Colorectal Screening: Completed 2013. Repeat every 10 years; No longer required.   Mammogram: Completed 12/25/17. Repeat every year;  Ordered today. Pt provided with contact information and advised to call to schedule appt.   Bone Density: Completed 12/25/17. Results reflect OSTEOPOROSIS. Repeat every 2 years.  Lung Cancer Screening: (Low Dose CT Chest recommended if Age 6-80 years, 30 pack-year currently smoking OR have quit w/in 15years.) does not qualify.   Additional Screening:  Hepatitis C Screening: does qualify; Completed 12/10/17  Vision Screening: Recommended annual ophthalmology exams for early detection of glaucoma and other disorders of the eye. Is the patient up to date with their annual eye exam?  Yes  Who is the provider or what is the name of the office in which the pt attends annual eye exams? Dr. Mallie Mussel  Dental Screening: Recommended annual dental exams for proper oral hygiene  Community Resource Referral:  CRR required this visit?  No      Plan:     I have personally reviewed and addressed the Medicare Annual Wellness questionnaire and have noted the following in the patient's chart:  A. Medical and social history B. Use of alcohol, tobacco or illicit drugs  C. Current medications and supplements D. Functional ability and status E.  Nutritional status F.  Physical activity G. Advance directives H. List of other physicians I.  Hospitalizations, surgeries, and ER visits in previous 12 months J.  [place] such as hearing and vision if needed, cognitive and depression L. Referrals and appointments   In addition, I have reviewed and discussed with patient certain  preventive protocols, quality metrics, and best practice recommendations. A written personalized care plan for preventive services as well as general preventive health recommendations were provided to patient.   Signed,  Clemetine Marker, LPN Nurse Health Advisor   Nurse Notes: pt doing well and appreciative of visit today. Patient was on speaker phone for duration of call and her husband Izell [place] assisted in answering questions due to patient's limited ability to speak. Blood pressure slightly elevated today, patient states she forgot to take her medicine earlier today and BP is normally around 130/80 when checked at home. Advised patient to schedule follow up appt with Dr. Army Melia for yearly exam.

## 2018-12-18 ENCOUNTER — Other Ambulatory Visit: Payer: Self-pay | Admitting: Internal Medicine

## 2018-12-18 ENCOUNTER — Telehealth: Payer: Self-pay | Admitting: Internal Medicine

## 2018-12-18 DIAGNOSIS — R05 Cough: Secondary | ICD-10-CM

## 2018-12-18 DIAGNOSIS — R059 Cough, unspecified: Secondary | ICD-10-CM

## 2018-12-18 MED ORDER — BENZONATATE 100 MG PO CAPS: 100.0000 mg | ORAL_CAPSULE | Freq: Two times a day (BID) | ORAL | 0 refills | Status: AC | PRN

## 2018-12-18 NOTE — Telephone Encounter (Signed)
Please advise 

## 2018-12-18 NOTE — Telephone Encounter (Signed)
Husband called to say Crystal Kent is still have a continuous cough and still have not got over her sinus problem.

## 2018-12-18 NOTE — Telephone Encounter (Signed)
She will need to be seen if she thinks she needs more antibiotics.  I suggest trying tessalon perles to relieve her cough which is likely chronic from sinus drainage and post surgical changes.

## 2018-12-21 NOTE — Telephone Encounter (Signed)
Spoke with husband. Last two nights she has been better. Will call and make appt if doesn't go away. Patient could not take Perles because she is on feeding tube.

## 2018-12-24 ENCOUNTER — Encounter: Payer: Self-pay | Admitting: Internal Medicine

## 2018-12-24 ENCOUNTER — Ambulatory Visit
Admission: RE | Admit: 2018-12-24 | Discharge: 2018-12-24 | Disposition: A | Discharge status: Home / Self Care | Payer: Medicare HMO | Source: Ambulatory Visit | Attending: Internal Medicine | Admitting: Internal Medicine

## 2018-12-24 ENCOUNTER — Other Ambulatory Visit: Payer: Self-pay

## 2018-12-24 ENCOUNTER — Ambulatory Visit (INDEPENDENT_AMBULATORY_CARE_PROVIDER_SITE_OTHER): Payer: Medicare HMO | Admitting: Internal Medicine

## 2018-12-24 ENCOUNTER — Ambulatory Visit
Admission: RE | Admit: 2018-12-24 | Discharge: 2018-12-24 | Disposition: A | Discharge status: Home / Self Care | Payer: Medicare HMO | Attending: Internal Medicine | Admitting: Internal Medicine

## 2018-12-24 VITALS — BP 118/78 | HR 99 | Temp 96.4°F | Ht 59.0 in | Wt 104.6 lb

## 2018-12-24 DIAGNOSIS — R05 Cough: Secondary | ICD-10-CM

## 2018-12-24 DIAGNOSIS — R059 Cough, unspecified: Secondary | ICD-10-CM

## 2018-12-24 DIAGNOSIS — I10 Essential (primary) hypertension: Secondary | ICD-10-CM | POA: Diagnosis not present

## 2018-12-24 NOTE — Progress Notes (Signed)
Date:  12/24/2018   Name:  Crystal Kent   DOB:  12-04-1945   MRN:  [id number]   Chief Complaint: Hypertension (Patient had a high BP reading today after having a low one. 70/48 and 10 mins later BP came up to 168/82) and Fatigue (Says she has been feeling weak on and off. Cough- drainage in throat. Sleeps with 3 pillows to prop herself up because drainage makes cough worse.)  Hypertension  This is a chronic problem. The problem is controlled. Associated symptoms include shortness of breath (only with extreme exertion). Pertinent negatives include no chest pain, headaches or palpitations. Past treatments include calcium channel blockers. The current treatment provides significant improvement. There are no compliance problems.   Cough  This is a recurrent problem. The current episode started more than 1 month ago. The problem occurs every few minutes. Cough characteristics: unable to get up phlegn with cough. Associated symptoms include shortness of breath (only with extreme exertion). Pertinent negatives include no chest pain, chills, fever, headaches or wheezing. The symptoms are aggravated by lying down. She has tried a beta-agonist inhaler (she has tried albuterol but has trouble getting a good seal due to her jaw surgery) for the symptoms. Her past medical history is significant for pneumonia (she had pneumonia earlier this year and she does not think the cough ever really improved).  Oral cancer - recently seen by ENT/Oncology.  There was significant pooling of secretions noted in her hypopharynx.   Review of Systems  Constitutional: Positive for fatigue. Negative for chills, fever and unexpected weight change.  HENT: Positive for trouble swallowing.   Respiratory: Positive for cough and shortness of breath (only with extreme exertion). Negative for chest tightness and wheezing.   Cardiovascular: Negative for chest pain, palpitations and leg swelling.  Gastrointestinal: Negative  for abdominal pain.  Neurological: Positive for light-headedness (upon standing too quickly). Negative for dizziness and headaches.    Patient Active Problem List   Diagnosis Date Noted  . Mechanical complication of gastrostomy ([place])   . Oral candida 03/16/2018  . Tinea corporis 12/29/2017  . Insomnia 04/23/2017  . 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 Hypertension 06/28/2016  . Allergic rhinitis 06/28/2016  . Current use of proton pump inhibitor 06/28/2016  . 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 Chronic pansinusitis 02/13/2016  . Eustachian tube dysfunction 12/14/2014  . Hearing loss 12/14/2014  . Lesion of buccal mucosa 12/14/2014  . Dysphagia, oropharyngeal 05/23/2014  . Hypothyroidism 04/27/2014  . 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 Clavulanic Acid Diarrhea    Past Surgical History:  Procedure Laterality Date  . ABDOMINAL HYSTERECTOMY    . BREAST BIOPSY Left    bx/clip-neg  . hemimandibulectomy Right 1998  . MYRINGOTOMY WITH TUBE PLACEMENT Right 02/27/2017   Procedure: MYRINGOTOMY WITH TUBE PLACEMENT Right ear.;  Surgeon: Margaretha Sheffield, MD;  Location: [place];  Service: ENT;  Laterality: Right;  . partial maxillectomy Right   . PEG PLACEMENT N/A 05/27/2017   Procedure: PERCUTANEOUS ENDOSCOPIC GASTROSTOMY (PEG) PLACEMENT;  Surgeon: Lucilla Lame, MD;  Location: [facility];  Service: Endoscopy;  Laterality: N/A;  . PEG PLACEMENT N/A 09/07/2018   Procedure: PERCUTANEOUS ENDOSCOPIC GASTROSTOMY (PEG) PLACEMENT;  Surgeon: Lucilla Lame, MD;  Location: [place];  Service: Endoscopy;  Laterality: N/A;  . RADICAL NECK DISSECTION Right 1998  multiple oral cancers    Social History   Tobacco Use  . Smoking status: Never Smoker  . Smokeless tobacco: Never Used  . Tobacco comment: Smoking  cessation materials not required  Substance Use Topics  . Alcohol use: No  . Drug use: No     Medication list has been reviewed and updated.  Current Meds  Medication Sig  . acetaminophen (TYLENOL) 325 MG tablet Take 650 mg by mouth every 6 (six) hours as needed.  Marland Kitchen albuterol (PROVENTIL HFA;VENTOLIN HFA) 108 (90 Base) MCG/ACT inhaler Inhale 2 puffs into the lungs every 6 (six) hours as needed for wheezing or shortness of breath.  Marland Kitchen amLODipine (NORVASC) 5 MG tablet Take 1 tablet (5 mg total) by mouth daily.  . Artificial Saliva (BIOTENE ORALBALANCE DRY MOUTH) GEL by Transmucosal route 4 (four) times daily.   . benzonatate (TESSALON) 100 MG capsule Take 1 capsule (100 mg total) by mouth 2 (two) times daily as needed for up to 15 days for cough.  . Calcium Carbonate-Vit D-Min (CALCIUM 1200 PO) Take by mouth daily.  . fluticasone (FLONASE) 50 MCG/ACT nasal spray Place 2 sprays into both nostrils daily as needed.  Marland Kitchen levothyroxine (SYNTHROID, LEVOTHROID) 75 MCG tablet Take 1 tablet (75 mcg total) by mouth daily.  Marland Kitchen loratadine (CLARITIN) 10 MG tablet Place 1 tablet (10 mg total) into feeding tube daily.  . meclizine (ANTIVERT) 12.5 MG tablet Place 1 tablet (12.5 mg total) into feeding tube 3 (three) times daily as needed for dizziness.  Marland Kitchen omeprazole (PRILOSEC) 20 MG capsule TAKE 1 CAPSULE EVERY DAY    PHQ 2/9 Scores 12/24/2018 12/16/2018 10/09/2018 09/08/2018  PHQ - 2 Score 0 0 0 0  PHQ- 9 Score - - - -    BP Readings from Last 3 Encounters:  12/24/18 118/78  12/16/18 (!) 146/95  10/09/18 122/76    Physical Exam Vitals signs and nursing note reviewed.  Constitutional:      General: She is not in acute distress.    Appearance: She is well-developed.  HENT:     Head: Normocephalic and atraumatic.     Comments: Post surgical changes of the right jaw and mouth Cardiovascular:     Rate and Rhythm: Normal rate and regular rhythm.     Heart sounds: No murmur.  Pulmonary:     Effort:  Pulmonary effort is normal. No respiratory distress.     Breath sounds: No decreased breath sounds or wheezing.     Comments: Bronchial breath sounds with loose secretions noted that pt is unable to expectorate Musculoskeletal: Normal range of motion.  Skin:    General: Skin is warm and dry.     Findings: No rash.  Neurological:     Mental Status: She is alert and oriented to person, place, and time.  Psychiatric:        Behavior: Behavior normal.        Thought Content: Thought content normal.     Wt Readings from Last 3 Encounters:  12/24/18 104 lb 9.6 oz (47.4 kg)  12/16/18 104 lb (47.2 kg)  10/09/18 107 lb (48.5 kg)    BP 118/78   Pulse 99   Temp (!) 96.4 F (35.8 C) (Axillary)   Ht 4\' 11"  (1.499 m)   Wt 104 lb 9.6 oz (47.4 kg)   SpO2 95%   BMI 21.13 kg/m   Assessment and Plan: 1. Cough in adult Sample of Symbicort 160 with Spacer - one puff bid - for presumed  persistent airway inflammation after PNA Will hold on antibiotics for now - afebrile with normal O2 sat - DG Chest 2 View; Future  2. Essential hypertension Controlled Pt cautioned about transient orthostatic hypotension   Partially dictated using Editor, commissioning. Any errors are unintentional.  Halina Maidens, MD [facility]  12/24/2018

## 2018-12-24 NOTE — Patient Instructions (Signed)
Use Symbicort - one inhalation twice a day

## 2018-12-29 DIAGNOSIS — Z85818 Personal history of malignant neoplasm of other sites of lip, oral cavity, and pharynx: Secondary | ICD-10-CM | POA: Diagnosis not present

## 2018-12-29 DIAGNOSIS — H6122 Impacted cerumen, left ear: Secondary | ICD-10-CM | POA: Diagnosis not present

## 2018-12-29 DIAGNOSIS — H6690 Otitis media, unspecified, unspecified ear: Secondary | ICD-10-CM | POA: Diagnosis not present

## 2018-12-29 DIAGNOSIS — H7201 Central perforation of tympanic membrane, right ear: Secondary | ICD-10-CM | POA: Diagnosis not present

## 2018-12-29 DIAGNOSIS — H6980 Other specified disorders of Eustachian tube, unspecified ear: Secondary | ICD-10-CM | POA: Diagnosis not present

## 2019-01-08 DIAGNOSIS — Z85818 Personal history of malignant neoplasm of other sites of lip, oral cavity, and pharynx: Secondary | ICD-10-CM | POA: Diagnosis not present

## 2019-01-08 DIAGNOSIS — H6980 Other specified disorders of Eustachian tube, unspecified ear: Secondary | ICD-10-CM | POA: Diagnosis not present

## 2019-01-08 DIAGNOSIS — H90A31 Mixed conductive and sensorineural hearing loss, unilateral, right ear with restricted hearing on the contralateral side: Secondary | ICD-10-CM | POA: Diagnosis not present

## 2019-01-08 DIAGNOSIS — H7201 Central perforation of tympanic membrane, right ear: Secondary | ICD-10-CM | POA: Diagnosis not present

## 2019-02-04 DIAGNOSIS — H7200 Central perforation of tympanic membrane, unspecified ear: Secondary | ICD-10-CM | POA: Diagnosis not present

## 2019-02-04 DIAGNOSIS — H6980 Other specified disorders of Eustachian tube, unspecified ear: Secondary | ICD-10-CM | POA: Diagnosis not present

## 2019-02-04 DIAGNOSIS — H6691 Otitis media, unspecified, right ear: Secondary | ICD-10-CM | POA: Diagnosis not present

## 2019-02-12 DIAGNOSIS — I10 Essential (primary) hypertension: Secondary | ICD-10-CM | POA: Diagnosis not present

## 2019-02-12 DIAGNOSIS — R7989 Other specified abnormal findings of blood chemistry: Secondary | ICD-10-CM | POA: Diagnosis not present

## 2019-02-12 DIAGNOSIS — Z931 Gastrostomy status: Secondary | ICD-10-CM | POA: Diagnosis not present

## 2019-02-12 DIAGNOSIS — M810 Age-related osteoporosis without current pathological fracture: Secondary | ICD-10-CM | POA: Diagnosis not present

## 2019-02-12 DIAGNOSIS — G2581 Restless legs syndrome: Secondary | ICD-10-CM | POA: Diagnosis not present

## 2019-02-12 DIAGNOSIS — E039 Hypothyroidism, unspecified: Secondary | ICD-10-CM | POA: Diagnosis not present

## 2019-02-12 DIAGNOSIS — C801 Malignant (primary) neoplasm, unspecified: Secondary | ICD-10-CM | POA: Diagnosis not present

## 2019-02-12 DIAGNOSIS — E871 Hypo-osmolality and hyponatremia: Secondary | ICD-10-CM | POA: Diagnosis not present

## 2019-02-18 DIAGNOSIS — R42 Dizziness and giddiness: Secondary | ICD-10-CM | POA: Diagnosis not present

## 2019-02-18 DIAGNOSIS — H6691 Otitis media, unspecified, right ear: Secondary | ICD-10-CM | POA: Diagnosis not present

## 2019-03-01 DIAGNOSIS — I10 Essential (primary) hypertension: Secondary | ICD-10-CM | POA: Diagnosis not present

## 2019-03-01 DIAGNOSIS — Z9221 Personal history of antineoplastic chemotherapy: Secondary | ICD-10-CM | POA: Diagnosis not present

## 2019-03-01 DIAGNOSIS — E079 Disorder of thyroid, unspecified: Secondary | ICD-10-CM | POA: Diagnosis not present

## 2019-03-01 DIAGNOSIS — Z923 Personal history of irradiation: Secondary | ICD-10-CM | POA: Diagnosis not present

## 2019-03-01 DIAGNOSIS — Z79899 Other long term (current) drug therapy: Secondary | ICD-10-CM | POA: Diagnosis not present

## 2019-03-01 DIAGNOSIS — C069 Malignant neoplasm of mouth, unspecified: Secondary | ICD-10-CM | POA: Diagnosis not present

## 2019-03-01 DIAGNOSIS — K1370 Unspecified lesions of oral mucosa: Secondary | ICD-10-CM | POA: Diagnosis not present

## 2019-03-01 DIAGNOSIS — C050 Malignant neoplasm of hard palate: Secondary | ICD-10-CM | POA: Diagnosis not present

## 2019-03-09 DIAGNOSIS — E871 Hypo-osmolality and hyponatremia: Secondary | ICD-10-CM | POA: Diagnosis not present

## 2019-03-26 DIAGNOSIS — G2581 Restless legs syndrome: Secondary | ICD-10-CM | POA: Diagnosis not present

## 2019-03-26 DIAGNOSIS — Z9181 History of falling: Secondary | ICD-10-CM | POA: Diagnosis not present

## 2019-03-26 DIAGNOSIS — Z23 Encounter for immunization: Secondary | ICD-10-CM | POA: Diagnosis not present

## 2019-03-26 DIAGNOSIS — M810 Age-related osteoporosis without current pathological fracture: Secondary | ICD-10-CM | POA: Diagnosis not present

## 2019-03-26 DIAGNOSIS — E871 Hypo-osmolality and hyponatremia: Secondary | ICD-10-CM | POA: Diagnosis not present

## 2019-03-26 DIAGNOSIS — I10 Essential (primary) hypertension: Secondary | ICD-10-CM | POA: Diagnosis not present

## 2019-04-07 DIAGNOSIS — J329 Chronic sinusitis, unspecified: Secondary | ICD-10-CM | POA: Diagnosis not present

## 2019-04-07 DIAGNOSIS — Z85818 Personal history of malignant neoplasm of other sites of lip, oral cavity, and pharynx: Secondary | ICD-10-CM | POA: Diagnosis not present

## 2019-04-08 DIAGNOSIS — R509 Fever, unspecified: Secondary | ICD-10-CM | POA: Diagnosis not present

## 2019-04-26 DIAGNOSIS — R062 Wheezing: Secondary | ICD-10-CM | POA: Diagnosis not present

## 2019-04-26 DIAGNOSIS — J329 Chronic sinusitis, unspecified: Secondary | ICD-10-CM | POA: Diagnosis not present

## 2019-04-26 DIAGNOSIS — B9689 Other specified bacterial agents as the cause of diseases classified elsewhere: Secondary | ICD-10-CM | POA: Diagnosis not present

## 2019-05-07 DIAGNOSIS — E871 Hypo-osmolality and hyponatremia: Secondary | ICD-10-CM | POA: Diagnosis not present

## 2019-05-07 DIAGNOSIS — Z7951 Long term (current) use of inhaled steroids: Secondary | ICD-10-CM | POA: Diagnosis not present

## 2019-05-07 DIAGNOSIS — Z931 Gastrostomy status: Secondary | ICD-10-CM | POA: Diagnosis not present

## 2019-05-07 DIAGNOSIS — R05 Cough: Secondary | ICD-10-CM | POA: Diagnosis not present

## 2019-05-07 DIAGNOSIS — I10 Essential (primary) hypertension: Secondary | ICD-10-CM | POA: Diagnosis not present

## 2019-05-07 DIAGNOSIS — R42 Dizziness and giddiness: Secondary | ICD-10-CM | POA: Diagnosis not present

## 2019-05-07 DIAGNOSIS — B9689 Other specified bacterial agents as the cause of diseases classified elsewhere: Secondary | ICD-10-CM | POA: Diagnosis not present

## 2019-05-07 DIAGNOSIS — R062 Wheezing: Secondary | ICD-10-CM | POA: Diagnosis not present

## 2019-05-07 DIAGNOSIS — J329 Chronic sinusitis, unspecified: Secondary | ICD-10-CM | POA: Diagnosis not present

## 2019-05-31 DIAGNOSIS — K1370 Unspecified lesions of oral mucosa: Secondary | ICD-10-CM | POA: Diagnosis not present

## 2019-05-31 DIAGNOSIS — I10 Essential (primary) hypertension: Secondary | ICD-10-CM | POA: Diagnosis not present

## 2019-05-31 DIAGNOSIS — Z7689 Persons encountering health services in other specified circumstances: Secondary | ICD-10-CM | POA: Diagnosis not present

## 2019-05-31 DIAGNOSIS — L859 Epidermal thickening, unspecified: Secondary | ICD-10-CM | POA: Diagnosis not present

## 2019-05-31 DIAGNOSIS — B370 Candidal stomatitis: Secondary | ICD-10-CM | POA: Diagnosis not present

## 2019-05-31 DIAGNOSIS — C050 Malignant neoplasm of hard palate: Secondary | ICD-10-CM | POA: Diagnosis not present

## 2019-05-31 DIAGNOSIS — E079 Disorder of thyroid, unspecified: Secondary | ICD-10-CM | POA: Diagnosis not present

## 2019-05-31 DIAGNOSIS — B379 Candidiasis, unspecified: Secondary | ICD-10-CM | POA: Diagnosis not present

## 2019-05-31 DIAGNOSIS — Z923 Personal history of irradiation: Secondary | ICD-10-CM | POA: Diagnosis not present

## 2019-05-31 DIAGNOSIS — C069 Malignant neoplasm of mouth, unspecified: Secondary | ICD-10-CM | POA: Diagnosis not present

## 2019-06-03 DIAGNOSIS — C069 Malignant neoplasm of mouth, unspecified: Secondary | ICD-10-CM | POA: Diagnosis not present

## 2019-06-03 DIAGNOSIS — R918 Other nonspecific abnormal finding of lung field: Secondary | ICD-10-CM | POA: Diagnosis not present

## 2019-06-03 DIAGNOSIS — R911 Solitary pulmonary nodule: Secondary | ICD-10-CM | POA: Diagnosis not present

## 2019-06-22 DIAGNOSIS — I10 Essential (primary) hypertension: Secondary | ICD-10-CM | POA: Diagnosis not present

## 2019-06-22 DIAGNOSIS — Z1231 Encounter for screening mammogram for malignant neoplasm of breast: Secondary | ICD-10-CM | POA: Diagnosis not present

## 2019-06-22 DIAGNOSIS — E039 Hypothyroidism, unspecified: Secondary | ICD-10-CM | POA: Diagnosis not present

## 2019-06-22 DIAGNOSIS — Z85828 Personal history of other malignant neoplasm of skin: Secondary | ICD-10-CM | POA: Diagnosis not present

## 2019-06-22 DIAGNOSIS — B370 Candidal stomatitis: Secondary | ICD-10-CM | POA: Diagnosis not present

## 2019-07-01 DIAGNOSIS — K1379 Other lesions of oral mucosa: Secondary | ICD-10-CM | POA: Diagnosis not present

## 2019-07-01 DIAGNOSIS — E039 Hypothyroidism, unspecified: Secondary | ICD-10-CM | POA: Diagnosis not present

## 2019-07-01 DIAGNOSIS — Z7689 Persons encountering health services in other specified circumstances: Secondary | ICD-10-CM | POA: Diagnosis not present

## 2019-07-14 ENCOUNTER — Other Ambulatory Visit: Payer: Self-pay | Admitting: Internal Medicine

## 2019-08-10 DIAGNOSIS — R0989 Other specified symptoms and signs involving the circulatory and respiratory systems: Secondary | ICD-10-CM | POA: Diagnosis not present

## 2019-08-10 DIAGNOSIS — R0981 Nasal congestion: Secondary | ICD-10-CM | POA: Diagnosis not present

## 2019-08-10 DIAGNOSIS — R05 Cough: Secondary | ICD-10-CM | POA: Diagnosis not present

## 2019-08-23 ENCOUNTER — Encounter: Payer: Self-pay | Admitting: Emergency Medicine

## 2019-08-23 ENCOUNTER — Other Ambulatory Visit: Payer: Self-pay

## 2019-08-23 ENCOUNTER — Ambulatory Visit
Admission: EM | Admit: 2019-08-23 | Discharge: 2019-08-23 | Disposition: A | Discharge status: Home / Self Care | Payer: Medicare HMO | Attending: Family Medicine | Admitting: Family Medicine

## 2019-08-23 DIAGNOSIS — K9423 Gastrostomy malfunction: Secondary | ICD-10-CM | POA: Insufficient documentation

## 2019-08-23 DIAGNOSIS — R42 Dizziness and giddiness: Secondary | ICD-10-CM | POA: Insufficient documentation

## 2019-08-23 DIAGNOSIS — Z79899 Other long term (current) drug therapy: Secondary | ICD-10-CM | POA: Diagnosis not present

## 2019-08-23 DIAGNOSIS — K219 Gastro-esophageal reflux disease without esophagitis: Secondary | ICD-10-CM | POA: Insufficient documentation

## 2019-08-23 DIAGNOSIS — G4700 Insomnia, unspecified: Secondary | ICD-10-CM | POA: Diagnosis not present

## 2019-08-23 DIAGNOSIS — R0981 Nasal congestion: Secondary | ICD-10-CM | POA: Diagnosis not present

## 2019-08-23 DIAGNOSIS — E039 Hypothyroidism, unspecified: Secondary | ICD-10-CM | POA: Insufficient documentation

## 2019-08-23 DIAGNOSIS — J0101 Acute recurrent maxillary sinusitis: Secondary | ICD-10-CM | POA: Insufficient documentation

## 2019-08-23 DIAGNOSIS — Z7901 Long term (current) use of anticoagulants: Secondary | ICD-10-CM | POA: Insufficient documentation

## 2019-08-23 DIAGNOSIS — Z20822 Contact with and (suspected) exposure to covid-19: Secondary | ICD-10-CM | POA: Insufficient documentation

## 2019-08-23 DIAGNOSIS — I10 Essential (primary) hypertension: Secondary | ICD-10-CM | POA: Diagnosis not present

## 2019-08-23 MED ORDER — AMOXICILLIN-POT CLAVULANATE 875-125 MG PO TABS: 1.0000 | ORAL_TABLET | Freq: Two times a day (BID) | ORAL | 0 refills | Status: DC

## 2019-08-23 NOTE — ED Triage Notes (Signed)
Pt c/o nasal congestion, sinus pain/pressure, post nasal drainage, headache and cough. Started about 3 weeks ago. Denies fever, body aches. She states that she also gets dizzy when she turns her head or bends over.

## 2019-08-23 NOTE — Discharge Instructions (Addendum)
If you are not improving follow-up with your primary care physician.  You were tested for COVID-19 today.  Results should be available in 24 to 48 hours.  During that time you should quarantine until the results are available.

## 2019-08-23 NOTE — ED Provider Notes (Signed)
[facility]    CSN: [id number] Arrival date & time: 08/23/19  1326      History   Chief Complaint Chief Complaint  Patient presents with  . Nasal Congestion    HPI Crystal Kent is a 74 y.o. female.   HPI  74 year old female presents with a 3-week history of nasal congestion sinus pain and pressure postnasal drainage headache and cough.  He has had no fever chills or body aches.  Still relates occasional dizziness when she turns her head or bends over.  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  Her communication skills are decreased because of the surgery very difficult to understand her and she is not accompanied by any other family member.   Review of her extensive medical records from [facility] with a CT chest with contrast performed on 06/03/2019 showed diffuse central lobar tree-in-bud nodules in the posterior segments of the right upper lobe middle lobe lingula and bilateral lower lobes.  Is indeterminate but concerning for bronchiolitis secondary to infection aspiration she was recommended to have a follow-up CT in 3 months to ensure resolution.  Under the care of the physician at [facility].        Past Medical History:  Diagnosis Date  . Acid reflux   . Allergy   . Cancer ([place]) 1998   mouth ca-chemo/rad  . Difficult intubation    Pt reports "small airway"  . Hypertension   . Personal history of chemotherapy   . Personal history of radiation therapy   . Thyroid disease   . Uses prosthesis    plate to cover hole in roof of mouth s/p cancer surgery, doesn't wear anymore    Patient Active Problem List   Diagnosis Date Noted  . Mechanical complication of gastrostomy ([place])   . Oral candida 03/16/2018  . Tinea corporis 12/29/2017  . Insomnia 04/23/2017  . 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 Essential hypertension 06/28/2016  . Allergic rhinitis 06/28/2016  . Current use of proton  pump inhibitor 06/28/2016  . Gastrostomy in place [facility]) 05/02/2016  . Chronic pansinusitis 02/13/2016  . Eustachian tube dysfunction 12/14/2014  . Hearing loss 12/14/2014  . Lesion of buccal mucosa 12/14/2014  . Dysphagia, oropharyngeal 05/23/2014  . Hypothyroidism 04/27/2014  . 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 ABDOMINAL HYSTERECTOMY    . BREAST BIOPSY Left    bx/clip-neg  . hemimandibulectomy Right 1998  . MYRINGOTOMY WITH TUBE PLACEMENT Right 02/27/2017   Procedure: MYRINGOTOMY WITH TUBE PLACEMENT Right ear.;  Surgeon: Margaretha Sheffield, MD;  Location: [place];  Service: ENT;  Laterality: Right;  . partial maxillectomy Right   . PEG PLACEMENT N/A 05/27/2017   Procedure: PERCUTANEOUS ENDOSCOPIC GASTROSTOMY (PEG) PLACEMENT;  Surgeon: Lucilla Lame, MD;  Location: [facility];  Service: Endoscopy;  Laterality: N/A;  . PEG PLACEMENT N/A 09/07/2018   Procedure: PERCUTANEOUS ENDOSCOPIC GASTROSTOMY (PEG) PLACEMENT;  Surgeon: Lucilla Lame, MD;  Location: [place];  Service: Endoscopy;  Laterality: N/A;  . RADICAL NECK DISSECTION Right 1998   multiple oral cancers    OB History   No obstetric history on file.      Home Medications    Prior to Admission medications   Medication Sig Start Date End Date Taking? Authorizing Provider  acetaminophen (  TYLENOL) 325 MG tablet Take 650 mg by mouth every 6 (six) hours as needed.   Yes [provider]  albuterol (PROVENTIL HFA;VENTOLIN HFA) 108 (90 Base) MCG/ACT inhaler Inhale 2 puffs into the lungs every 6 (six) hours as needed for wheezing or shortness of breath. 07/28/18  Yes Glean Hess, MD  amLODipine (NORVASC) 5 MG tablet Take 1 tablet (5 mg total) by mouth daily. 08/04/18  Yes Glean Hess, MD  Artificial Saliva (BIOTENE  ORALBALANCE DRY MOUTH) GEL by Transmucosal route 4 (four) times daily.  11/30/18  Yes [provider]  fluticasone (FLONASE) 50 MCG/ACT nasal spray Place 2 sprays into both nostrils daily as needed. 04/27/13  Yes [provider]  levothyroxine (SYNTHROID, LEVOTHROID) 75 MCG tablet Take 1 tablet (75 mcg total) by mouth daily. 08/04/18  Yes Glean Hess, MD  loratadine (CLARITIN) 10 MG tablet Place 1 tablet (10 mg total) into feeding tube daily. 10/09/18  Yes Glean Hess, MD  meclizine (ANTIVERT) 12.5 MG tablet Place 1 tablet (12.5 mg total) into feeding tube 3 (three) times daily as needed for dizziness. 10/09/18  Yes Glean Hess, MD  omeprazole (PRILOSEC) 20 MG capsule TAKE 1 CAPSULE EVERY DAY 11/04/18  Yes Glean Hess, MD  amoxicillin-clavulanate (AUGMENTIN) 875-125 MG tablet Take 1 tablet by mouth every 12 (twelve) hours. 08/23/19   Lorin Picket, PA-C  Calcium Carbonate-Vit D-Min (CALCIUM 1200 PO) Take by mouth daily.  08/23/19  [provider]    Family History Family History  Problem Relation Age of Onset  . Alzheimer's disease Mother   . Heart attack Father   . Healthy Son   . Healthy Son   . Breast cancer Neg Hx     Social History Social History   Tobacco Use  . Smoking status: Never Smoker  . Smokeless tobacco: Never Used  . Tobacco comment: Smoking cessation materials not required  Substance Use Topics  . Alcohol use: No  . Drug use: No     Allergies   Clavulanic acid   Review of Systems Review of Systems  Constitutional: Positive for activity change. Negative for appetite change, chills, fatigue and fever.  HENT: Positive for drooling, postnasal drip, rhinorrhea, sinus pressure and sinus pain.   Respiratory: Positive for cough. Negative for shortness of breath, wheezing and stridor.   Neurological: Positive for light-headedness.  All other systems reviewed and are negative.    Physical Exam Triage Vital Signs ED  Triage Vitals  Enc Vitals Group     BP 08/23/19 1343 (!) 152/78     Pulse Rate 08/23/19 1343 91     Resp 08/23/19 1343 18     Temp 08/23/19 1343 97.6 F (36.4 C)     Temp Source 08/23/19 1343 Oral     SpO2 08/23/19 1343 97 %     Weight 08/23/19 1339 104 lb 8 oz (47.4 kg)     Height 08/23/19 1339 4\' 11"  (1.499 m)     Head Circumference --      Peak Flow --      Pain Score 08/23/19 1338 4     Pain Loc --      Pain Edu? --      Excl. in [place]? --    No data found.  Updated Vital Signs BP (!) 152/78 (BP Location: Left Arm)   Pulse 91   Temp 97.6 F (36.4 C) (Oral)   Resp 18   Ht 4\' 11"  (1.499 m)  Wt 104 lb 8 oz (47.4 kg)   SpO2 97%   BMI 21.11 kg/m   Visual Acuity Right Eye Distance:   Left Eye Distance:   Bilateral Distance:    Right Eye Near:   Left Eye Near:    Bilateral Near:     Physical Exam Vitals and nursing note reviewed.  Constitutional:      General: She is not in acute distress.    Appearance: Normal appearance. She is normal weight. She is not ill-appearing, toxic-appearing or diaphoretic.  HENT:     Head: Normocephalic.     Comments: Patient has an obvious deformity of her jaw and mouth from the previous oral surgery.    Right Ear: Tympanic membrane, ear canal and external ear normal.     Left Ear: Tympanic membrane, ear canal and external ear normal.     Nose: Congestion present.     Comments: Tenderness to percussion over the maxillary sinuses bilateral    Mouth/Throat:     Mouth: Mucous membranes are moist.     Comments: Patient has had extensive surgery of the oral cavity and pharynx. Eyes:     Conjunctiva/sclera: Conjunctivae normal.  Cardiovascular:     Rate and Rhythm: Normal rate and regular rhythm.     Heart sounds: Normal heart sounds.  Pulmonary:     Effort: Pulmonary effort is normal.     Breath sounds: Rhonchi and rales present.     Comments: Left lower lobe crackles and loud rhonchi present.  Possible residual from previous  pneumonia.  Under the care of [facility] physicians Musculoskeletal:        General: Normal range of motion.     Cervical back: Normal range of motion and neck supple.  Skin:    General: Skin is warm and dry.  Neurological:     General: No focal deficit present.     Mental Status: She is alert and oriented to person, place, and time.      UC Treatments / Results  Labs (all labs ordered are listed, but only abnormal results are displayed) Labs Reviewed  NOVEL CORONAVIRUS, NAA (HOSP ORDER, SEND-OUT TO REF LAB; TAT 18-24 HRS)    EKG   Radiology No results found.  Procedures Procedures (including critical care time)  Medications Ordered in UC Medications - No data to display  Initial Impression / Assessment and Plan / UC Course  I have reviewed the triage vital signs and the nursing notes.  Pertinent labs & imaging results that were available during my care of the patient were reviewed by me and considered in my medical decision making (see chart for details).   74 year old female presents with a 3-week history of nasal congestion sinus pain and pressure postnasal drainage headache and cough.  She has recently within the last month or so had a pneumonia which is being followed by [facility] physicians.  Underwent a telemed conference last month and was prescribed Tessalon Perles Atrovent nasal spray but no antibiotics.  She states that the same symptoms on a yearly basis and has been diagnosed with a sinusitis.  She has had this problem for 3 weeks.  She continues to have symptoms despite symptomatic treatment up to this point.  I will start her empirically on Augmentin.  This is a allergy to Clavuanic acid which is part of the Augmentin formula.  Have asked her to if this does cause her diarrhea and she says with Augmentin she has never had that problem.  He  prefers to have the Augmentin instead of an alternative antibiotic.  Continue to use her other medications that were prescribed and to  telemed meeting.  She will continue to follow with [facility] physicians she will need another follow-up CT scan later this month.  Chest x-ray was not performed today since she does have an upcoming follow-up and is being cared for by [facility].   Final Clinical Impressions(s) / UC Diagnoses   Final diagnoses:  Acute recurrent maxillary sinusitis     Discharge Instructions     If you are not improving follow-up with your primary care physician.  You were tested for COVID-19 today.  Results should be available in 24 to 48 hours.  During that time you should quarantine until the results are available.    ED Prescriptions    Medication Sig Dispense Auth. Provider   amoxicillin-clavulanate (AUGMENTIN) 875-125 MG tablet Take 1 tablet by mouth every 12 (twelve) hours. 20 tablet Lorin Picket, PA-C     PDMP not reviewed this encounter.   Lorin Picket, PA-C 08/23/19 1519

## 2019-08-24 LAB — NOVEL CORONAVIRUS, NAA (HOSP ORDER, SEND-OUT TO REF LAB; TAT 18-24 HRS): SARS-CoV-2, NAA: NOT DETECTED

## 2019-09-08 ENCOUNTER — Other Ambulatory Visit: Payer: Self-pay | Admitting: Internal Medicine

## 2019-09-13 ENCOUNTER — Telehealth: Payer: Self-pay

## 2019-09-13 DIAGNOSIS — K1370 Unspecified lesions of oral mucosa: Secondary | ICD-10-CM | POA: Diagnosis not present

## 2019-09-13 NOTE — Telephone Encounter (Signed)
Patient husband is calling because he states the new feeding tube has came to there house. He states Dr. Allen Norris stated if they order the feeding the tube he could put it in the patient in the office. Patient states he would want this to be done in [place]. Please advised when to put the patient in at

## 2019-09-13 NOTE — Telephone Encounter (Signed)
Scheduling does not have to go through me and I trust you to put it on my schedule.

## 2019-09-14 NOTE — Telephone Encounter (Signed)
Pt scheduled for a peg tube change in the office on 09/16/19.

## 2019-09-16 ENCOUNTER — Other Ambulatory Visit: Payer: Self-pay

## 2019-09-16 ENCOUNTER — Ambulatory Visit: Payer: Medicare HMO | Admitting: Gastroenterology

## 2019-09-16 ENCOUNTER — Encounter: Payer: Self-pay | Admitting: Gastroenterology

## 2019-09-16 VITALS — BP 176/92 | HR 92 | Ht 59.0 in | Wt 102.4 lb

## 2019-09-16 DIAGNOSIS — K9423 Gastrostomy malfunction: Secondary | ICD-10-CM | POA: Diagnosis not present

## 2019-09-16 NOTE — Progress Notes (Signed)
Primary Care Physician: Barbaraann Boys, MD  Primary Gastroenterologist:  Dr. Lucilla Lame  No chief complaint on file.   HPI: Crystal Kent is a 74 y.o. female here with a history of a malfunctioning PEG tube.  The patient has ordered the PEG tube online since it was very expensive for the patient to have her PEG tube change in the hospital in the past.  The patient is now coming here for the PEG tube to be changed.  The patient was reporting that the connector to the PEG tube was leaking quite often.  Past Medical History:  Diagnosis Date  . Acid reflux   . Allergy   . Cancer ([place]) 1998   mouth ca-chemo/rad  . Difficult intubation    Pt reports "small airway"  . Hypertension   . Personal history of chemotherapy   . Personal history of radiation therapy   . Thyroid disease   . Uses prosthesis    plate to cover hole in roof of mouth s/p cancer surgery, doesn't wear anymore    Current Outpatient Medications  Medication Sig Dispense Refill  . acetaminophen (TYLENOL) 325 MG tablet Take 650 mg by mouth every 6 (six) hours as needed.    Marland Kitchen albuterol (PROVENTIL HFA;VENTOLIN HFA) 108 (90 Base) MCG/ACT inhaler Inhale 2 puffs into the lungs every 6 (six) hours as needed for wheezing or shortness of breath. 1 Inhaler 0  . amLODipine (NORVASC) 5 MG tablet Take 1 tablet (5 mg total) by mouth daily. 90 tablet 3  . amoxicillin-clavulanate (AUGMENTIN) 875-125 MG tablet Take 1 tablet by mouth every 12 (twelve) hours. 20 tablet 0  . Artificial Saliva (BIOTENE ORALBALANCE DRY MOUTH) GEL by Transmucosal route 4 (four) times daily.     . fluticasone (FLONASE) 50 MCG/ACT nasal spray Place 2 sprays into both nostrils daily as needed.    Marland Kitchen levothyroxine (SYNTHROID, LEVOTHROID) 75 MCG tablet Take 1 tablet (75 mcg total) by mouth daily. 90 tablet 3  . loratadine (CLARITIN) 10 MG tablet Place 1 tablet (10 mg total) into feeding tube daily. 30 tablet 5  . meclizine (ANTIVERT) 12.5 MG tablet  Place 1 tablet (12.5 mg total) into feeding tube 3 (three) times daily as needed for dizziness. 30 tablet 0  . omeprazole (PRILOSEC) 20 MG capsule TAKE 1 CAPSULE EVERY DAY 90 capsule 3   No current facility-administered medications for this visit.    Allergies as of 09/16/2019 - Review Complete 08/23/2019  Allergen Reaction Noted  . Clavulanic acid Diarrhea 10/09/2018    ROS:  General: Negative for anorexia, weight loss, fever, chills, fatigue, weakness. ENT: Negative for hoarseness, difficulty swallowing , nasal congestion. CV: Negative for chest pain, angina, palpitations, dyspnea on exertion, peripheral edema.  Respiratory: Negative for dyspnea at rest, dyspnea on exertion, cough, sputum, wheezing.  GI: See history of present illness. GU:  Negative for dysuria, hematuria, urinary incontinence, urinary frequency, nocturnal urination.  Endo: Negative for unusual weight change.    Physical Examination:   There were no vitals taken for this visit.  General: Well-nourished, well-developed in no acute distress.  Eyes: No icterus. Conjunctivae pink. Abdomen: Low-profile PEG was seen in place without any induration of the skin.  The old PEG was deflated of the 5 cc fluid in the balloon.  The new low-profile PEG was placed without difficulty and the balloon was inflated with 5 cc of sterile water. Extremities: No lower extremity edema. No clubbing or deformities.  Labs:    Imaging Studies:  No results found.  Assessment and Plan:   Crystal Kent is a 74 y.o. y/o female who comes in today with a leaking PEG tube.  The patient came with her own replacement PEG that she had bought.  The PEG tube was replaced in the exam room with the similar type and size PEG tube without difficulty.  The patient will contact me if she has any further issues.     Lucilla Lame, MD. Marval Regal    Note: This dictation was prepared with Dragon dictation along with smaller phrase technology. Any  transcriptional errors that result from this process are unintentional.

## 2019-10-13 DIAGNOSIS — C109 Malignant neoplasm of oropharynx, unspecified: Secondary | ICD-10-CM | POA: Diagnosis not present

## 2019-10-31 ENCOUNTER — Ambulatory Visit
Admission: EM | Admit: 2019-10-31 | Discharge: 2019-10-31 | Disposition: A | Discharge status: Home / Self Care | Payer: Medicare HMO | Attending: Emergency Medicine | Admitting: Emergency Medicine

## 2019-10-31 ENCOUNTER — Encounter: Payer: Self-pay | Admitting: Emergency Medicine

## 2019-10-31 ENCOUNTER — Ambulatory Visit (INDEPENDENT_AMBULATORY_CARE_PROVIDER_SITE_OTHER): Payer: Medicare HMO

## 2019-10-31 ENCOUNTER — Other Ambulatory Visit: Payer: Self-pay

## 2019-10-31 DIAGNOSIS — R05 Cough: Secondary | ICD-10-CM | POA: Diagnosis not present

## 2019-10-31 DIAGNOSIS — J849 Interstitial pulmonary disease, unspecified: Secondary | ICD-10-CM | POA: Diagnosis not present

## 2019-10-31 MED ORDER — BENZONATATE 200 MG PO CAPS: ORAL_CAPSULE | ORAL | 0 refills | Status: DC

## 2019-10-31 MED ORDER — AZITHROMYCIN 250 MG PO TABS: 250.0000 mg | ORAL_TABLET | Freq: Every day | ORAL | 0 refills | Status: DC

## 2019-10-31 MED ORDER — AMOXICILLIN-POT CLAVULANATE 875-125 MG PO TABS: 1.0000 | ORAL_TABLET | Freq: Two times a day (BID) | ORAL | 0 refills | Status: DC

## 2019-10-31 NOTE — ED Provider Notes (Signed)
[facility]    CSN: [id number] Arrival date & time: 10/31/19  1056      History   Chief Complaint Chief Complaint  Patient presents with  . Headache  . Cough    HPI Lidwina Sheffler is a 74 y.o. female.   HPI  74 year old female presents with cough postnasal drip and headache that started about 2 days ago.  She also states that she has had fevers.  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  She was also seen for an acute recurrent maxillary sinusitis on 08/23/2019 was treated with Augmentin.  Has a cough with adduction.  She was treated for pneumonia by [facility] physicians at that same time that I saw her.  She tells me she did then and she has the same thing every year.  Is extremely hard to understand due to the defects in her oral cavity.        Past Medical History:  Diagnosis Date  . Acid reflux   . Allergy   . 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 Difficult intubation    Pt reports "small airway"  . Hypertension   . Personal history of chemotherapy   . Personal history of radiation therapy   . Thyroid disease   . Uses prosthesis    plate to cover hole in roof of mouth s/p cancer surgery, doesn't wear anymore    Patient Active Problem List   Diagnosis Date Noted  . Mechanical complication of gastrostomy ([place])   . Oral candida 03/16/2018  . Tinea corporis 12/29/2017  . Insomnia 04/23/2017  . 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 Essential hypertension 06/28/2016  . Allergic rhinitis 06/28/2016  . Current use of proton pump inhibitor 06/28/2016  . Gastrostomy in place [facility]) 05/02/2016  . Chronic pansinusitis 02/13/2016  . Eustachian tube dysfunction 12/14/2014  . Hearing loss 12/14/2014  . Lesion of buccal mucosa 12/14/2014  . Dysphagia, oropharyngeal 05/23/2014  . Hypothyroidism 04/27/2014  . 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 ABDOMINAL HYSTERECTOMY    . BREAST BIOPSY Left    bx/clip-neg  . hemimandibulectomy Right 1998  . MYRINGOTOMY WITH TUBE PLACEMENT Right 02/27/2017   Procedure: MYRINGOTOMY WITH TUBE PLACEMENT Right ear.;  Surgeon: Margaretha Sheffield, MD;  Location: [place];  Service: ENT;  Laterality: Right;  . partial maxillectomy Right   . PEG PLACEMENT N/A 05/27/2017   Procedure: PERCUTANEOUS ENDOSCOPIC GASTROSTOMY (PEG) PLACEMENT;  Surgeon: Lucilla Lame, MD;  Location: [facility];  Service: Endoscopy;  Laterality: N/A;  . PEG PLACEMENT N/A 09/07/2018   Procedure: PERCUTANEOUS ENDOSCOPIC GASTROSTOMY (PEG) PLACEMENT;  Surgeon: Lucilla Lame, MD;  Location: [facility];  Service: Endoscopy;  Laterality: N/A;  . RADICAL NECK DISSECTION Right 1998   multiple oral cancers    OB History   No obstetric history on file.      Home Medications    Prior to Admission medications   Medication Sig Start Date End Date Taking? Authorizing Provider  albuterol (PROVENTIL HFA;VENTOLIN HFA) 108 (90 Base) MCG/ACT inhaler Inhale 2 puffs into the lungs every 6 (six) hours as needed for wheezing or shortness of breath. 07/28/18  Yes Halina Maidens  H, MD  amLODipine (NORVASC) 5 MG tablet Take 1 tablet (5 mg total) by mouth daily. 08/04/18  Yes Glean Hess, MD  Artificial Saliva (BIOTENE ORALBALANCE DRY MOUTH) GEL by Transmucosal route 4 (four) times daily.  11/30/18  Yes [provider]  fluticasone (FLONASE) 50 MCG/ACT nasal spray Place 2 sprays into both nostrils daily as needed. 04/27/13  Yes [provider]  levothyroxine (SYNTHROID, LEVOTHROID) 75 MCG tablet Take 1 tablet (75 mcg total) by mouth daily. 08/04/18  Yes Glean Hess, MD  loratadine (CLARITIN) 10 MG tablet Place 1  tablet (10 mg total) into feeding tube daily. 10/09/18  Yes Glean Hess, MD  omeprazole (PRILOSEC) 20 MG capsule TAKE 1 CAPSULE EVERY DAY 09/09/19  Yes Glean Hess, MD  acetaminophen (TYLENOL) 325 MG tablet Take 650 mg by mouth every 6 (six) hours as needed.    [provider]  amoxicillin-clavulanate (AUGMENTIN) 875-125 MG tablet Take 1 tablet by mouth every 12 (twelve) hours. 10/31/19   Lorin Picket, PA-C  azithromycin (ZITHROMAX) 250 MG tablet Take 1 tablet (250 mg total) by mouth daily. Take first 2 tablets together, then 1 every day until finished. 10/31/19   Lorin Picket, PA-C  benzonatate (TESSALON) 200 MG capsule Take one cap TID PRN cough 10/31/19   Lorin Picket, PA-C  meclizine (ANTIVERT) 12.5 MG tablet Place 1 tablet (12.5 mg total) into feeding tube 3 (three) times daily as needed for dizziness. 10/09/18   Glean Hess, MD  Calcium Carbonate-Vit D-Min (CALCIUM 1200 PO) Take by mouth daily.  08/23/19  [provider]    Family History Family History  Problem Relation Age of Onset  . Alzheimer's disease Mother   . Heart attack Father   . Healthy Son   . Healthy Son   . Breast cancer Neg Hx     Social History Social History   Tobacco Use  . Smoking status: Never Smoker  . Smokeless tobacco: Never Used  . Tobacco comment: Smoking cessation materials not required  Substance Use Topics  . Alcohol use: No  . Drug use: No     Allergies   Clavulanic acid   Review of Systems Review of Systems  Constitutional: Positive for activity change and fever. Negative for appetite change, chills, diaphoresis and fatigue.  HENT: Positive for rhinorrhea, sinus pressure and sinus pain.   Respiratory: Positive for cough and wheezing.   All other systems reviewed and are negative.    Physical Exam Triage Vital Signs ED Triage Vitals  Enc Vitals Group     BP 10/31/19 1118 (!) 160/93     Pulse Rate 10/31/19 1118 95     Resp 10/31/19 1118  15     Temp 10/31/19 1118 (!) 97.5 F (36.4 C)     Temp Source 10/31/19 1118 Oral     SpO2 10/31/19 1118 96 %     Weight 10/31/19 1115 103 lb (46.7 kg)     Height 10/31/19 1115 5' (1.524 m)     Head Circumference --      Peak Flow --      Pain Score 10/31/19 1115 2     Pain Loc --      Pain Edu? --      Excl. in [place]? --    No data found.  Updated Vital Signs BP (!) 160/93 (BP Location: Right Arm)   Pulse 95   Temp (!) 97.5 F (36.4 C) (Oral)   Resp  15   Ht 5' (1.524 m)   Wt 103 lb (46.7 kg)   SpO2 96%   BMI 20.12 kg/m   Visual Acuity Right Eye Distance:   Left Eye Distance:   Bilateral Distance:    Right Eye Near:   Left Eye Near:    Bilateral Near:     Physical Exam Vitals and nursing note reviewed.  Constitutional:      General: She is not in acute distress.    Appearance: She is well-developed and normal weight. She is not ill-appearing or toxic-appearing.  HENT:     Head: Normocephalic and atraumatic.  Cardiovascular:     Rate and Rhythm: Normal rate and regular rhythm.     Heart sounds: Normal heart sounds.  Pulmonary:     Effort: Pulmonary effort is normal.     Breath sounds: No wheezing or rhonchi.     Comments: Patient has inspiratory rhonchi and wheezes particularly on the left lower to middle lobe. Musculoskeletal:        General: Normal range of motion.     Cervical back: Normal range of motion and neck supple.  Skin:    General: Skin is warm and dry.  Neurological:     Mental Status: She is alert and oriented to person, place, and time.  Psychiatric:        Mood and Affect: Mood normal.        Speech: Speech normal.        Behavior: Behavior normal.       UC Treatments / Results  Labs (all labs ordered are listed, but only abnormal results are displayed) Labs Reviewed - No data to display  EKG   Radiology DG Chest 2 View  Result Date: 10/31/2019 CLINICAL DATA:  Cough and headache 2 days. EXAM: CHEST - 2 VIEW COMPARISON:   12/24/2018 FINDINGS: Lungs are adequately inflated with mild interstitial prominence over the mid to lower lungs which is a chronic finding although appears slightly worse with minimal patchy density in the left mid lung. Minimal right apical scarring. No effusion. Cardiomediastinal silhouette and remainder of the exam is unchanged. IMPRESSION: Slight worsening chronic interstitial prominence over the mid to lower lungs with subtle patchy density over the left midlung which may be due to superimposed acute process such as infection or atelectasis. Electronically Signed   By: Marin Olp M.D.   On: 10/31/2019 12:59    Procedures Procedures (including critical care time)  Medications Ordered in UC Medications - No data to display  Initial Impression / Assessment and Plan / UC Course  I have reviewed the triage vital signs and the nursing notes.  Pertinent labs & imaging results that were available during my care of the patient were reviewed by me and considered in my medical decision making (see chart for details).   74 year old female presents with cough, postnasal drip and headache that started about 2 days ago.  Dates that she has had felt feverish for the last several days.  She had previous history of pneumonia in February 2021 that was treated at [facility].  Reviewed her extensive medical records in particular the CT scan from 09/09/2019 showing diffuse centrilobular and tree-in-bud nodules in posterior segments of the right upper lobe middle lobe lingula and bilateral lower lobes.  It was concerning for bronchiolitis secondary to infection or aspiration.  However because of her symptoms and the findings on chest x-ray today I will start her on antibiotic therapy for possible pneumonia and  have her follow-up with her primary care physician as soon as possible.  I have given her a prescription for Augmentin and a Z-Pak.  I will also give her Tessalon Perles for her coughing.  She states she  will arrange an appointment with her primary care physician tomorrow for soon as possible.   Final Clinical Impressions(s) / UC Diagnoses   Final diagnoses:  Pneumonia, interstitial [facility])   Discharge Instructions   None    ED Prescriptions    Medication Sig Dispense Auth. Provider   amoxicillin-clavulanate (AUGMENTIN) 875-125 MG tablet Take 1 tablet by mouth every 12 (twelve) hours. 14 tablet Crecencio Mc P, PA-C   azithromycin (ZITHROMAX) 250 MG tablet Take 1 tablet (250 mg total) by mouth daily. Take first 2 tablets together, then 1 every day until finished. 6 tablet Lorin Picket, PA-C   benzonatate (TESSALON) 200 MG capsule Take one cap TID PRN cough 30 capsule Lorin Picket, PA-C     PDMP not reviewed this encounter.   Lorin Picket, PA-C 10/31/19 1801

## 2019-10-31 NOTE — ED Triage Notes (Signed)
Patient c/o cough, post nasal drip and headache that started 2 days ago.  Patient unsure about fevers.

## 2019-11-04 DIAGNOSIS — G893 Neoplasm related pain (acute) (chronic): Secondary | ICD-10-CM | POA: Diagnosis not present

## 2019-11-04 DIAGNOSIS — C801 Malignant (primary) neoplasm, unspecified: Secondary | ICD-10-CM | POA: Diagnosis not present

## 2019-11-04 DIAGNOSIS — G501 Atypical facial pain: Secondary | ICD-10-CM | POA: Diagnosis not present

## 2019-11-05 DIAGNOSIS — G893 Neoplasm related pain (acute) (chronic): Secondary | ICD-10-CM | POA: Diagnosis not present

## 2019-11-18 DIAGNOSIS — J189 Pneumonia, unspecified organism: Secondary | ICD-10-CM | POA: Diagnosis not present

## 2019-11-25 IMAGING — CR DG CHEST 2V
2 series · 2 of 2 positions shown · non-contrast
Comparison: Chest x-ray of 07/26/2016

CLINICAL DATA: History of recent diagnosis of bronchitis,
persistent cough and wheezing with upper anterior chest tightness

EXAM:
CHEST - 2 VIEW

[chest pa]
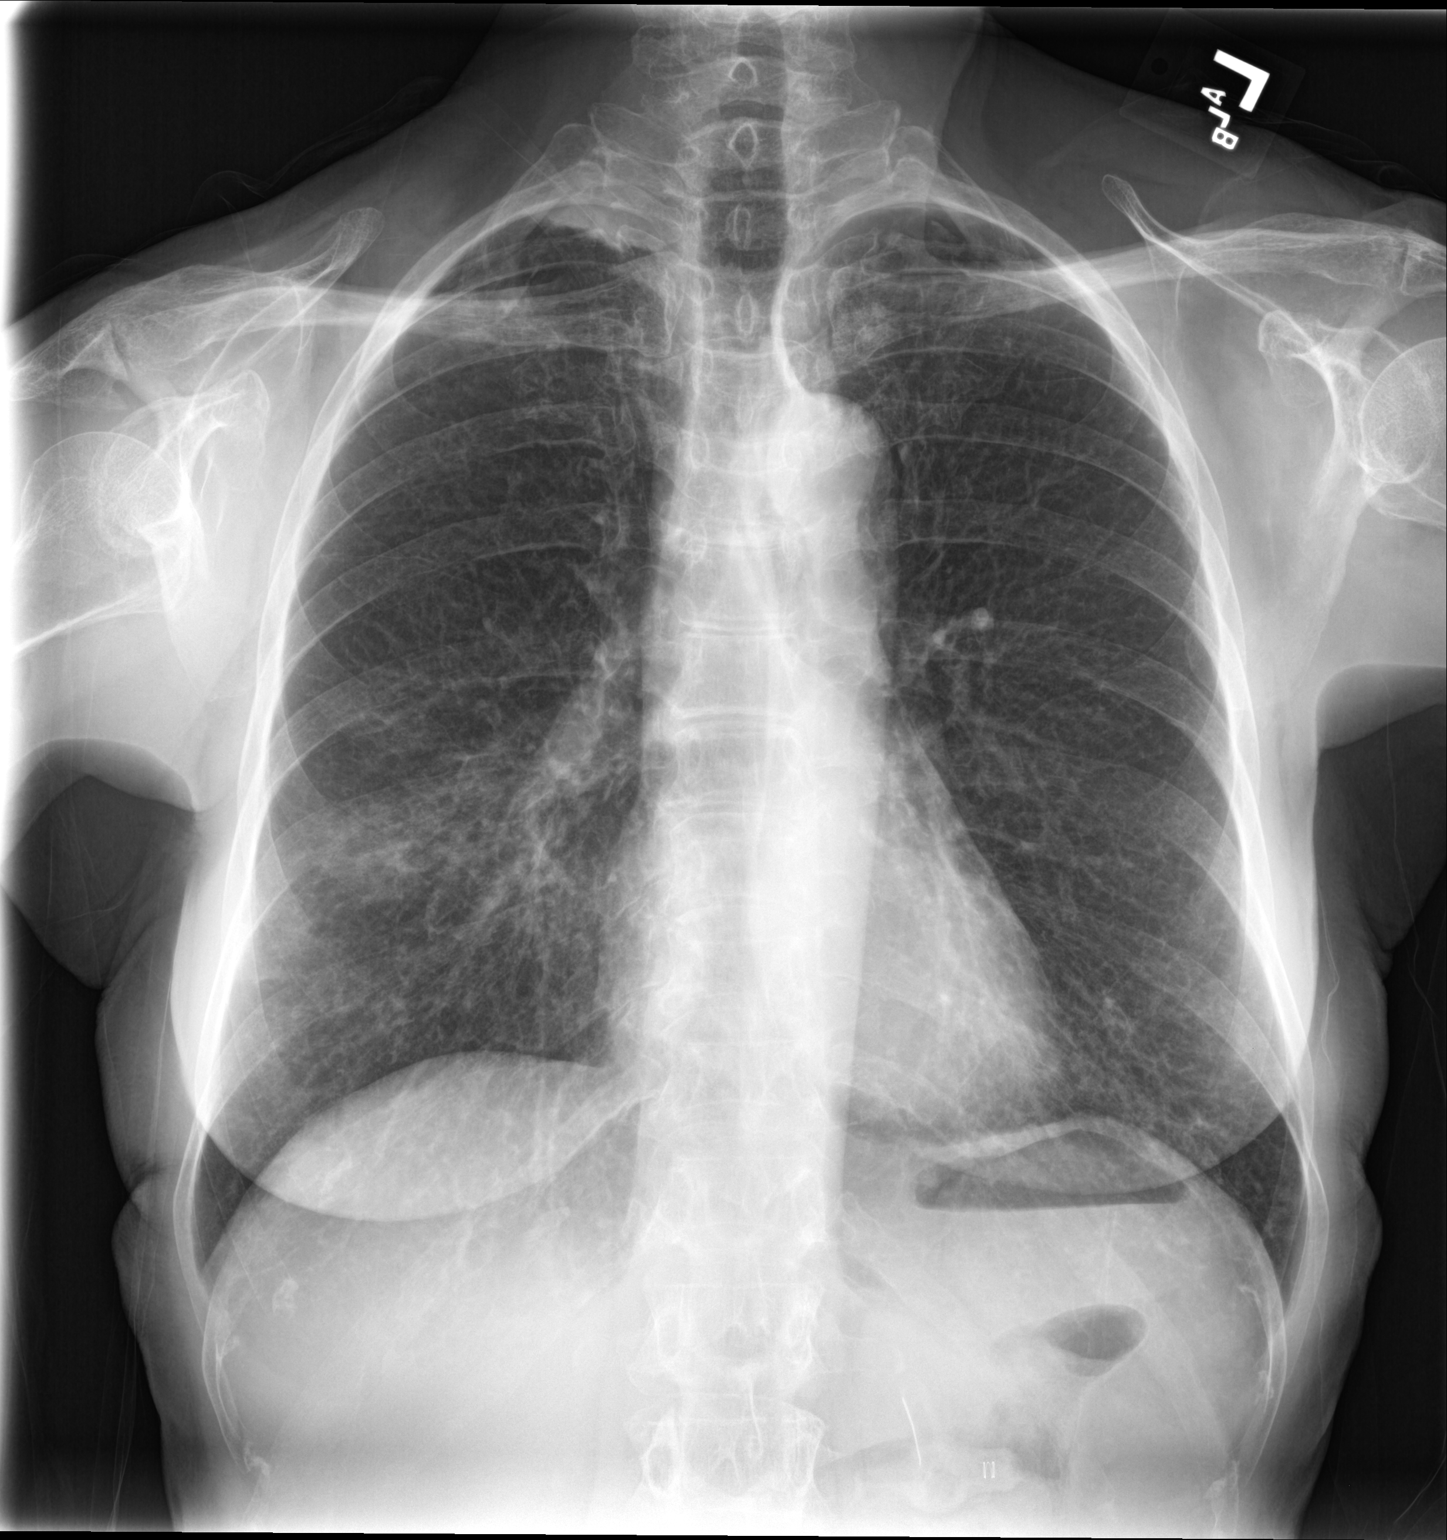

[chest lat]
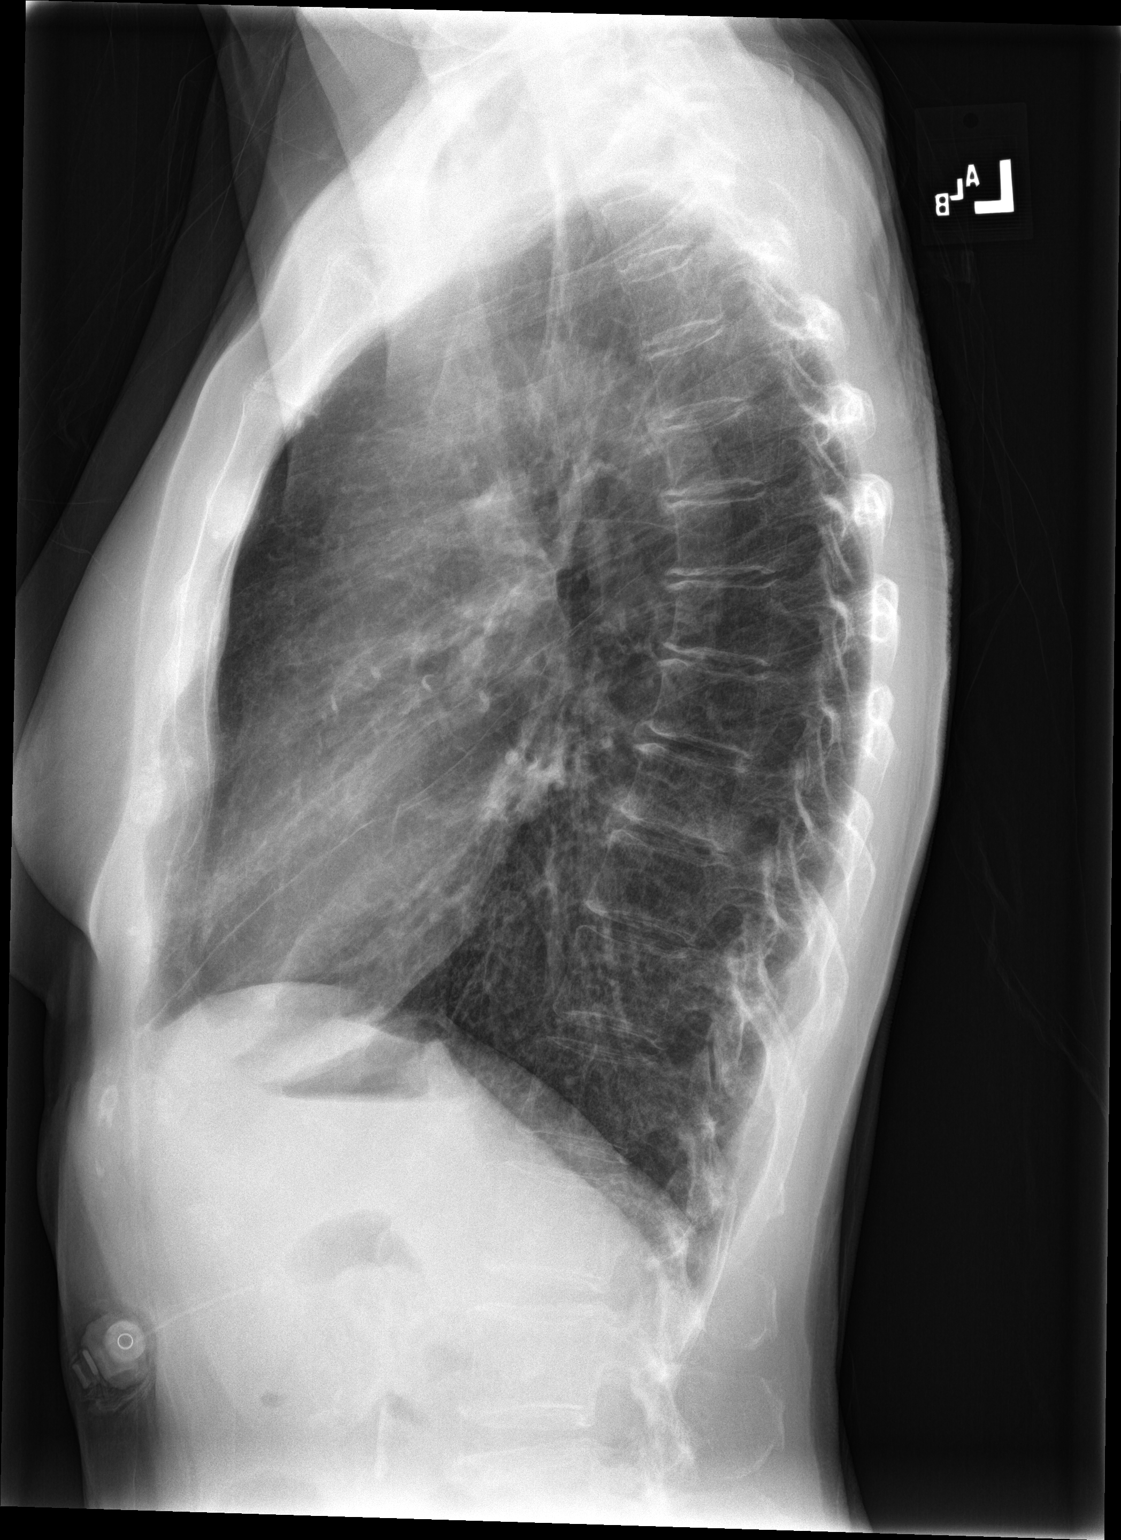

[2 of 2 positions shown; findings below may reference images not displayed]

FINDINGS: The previous opacity within the right upper lobe has cleared in the
interval. However there is now a new opacity at the right lung base
extending laterally most consistent with pneumonia possibly
involving the right middle lobe. The left lung is clear. No pleural
effusion is seen. Right apical pleuroparenchymal scarring is stable.
Mediastinal and hilar contours are unchanged. No bony abnormality is
noted.
IMPRESSION: 1. Resolution of right upper lobe opacity.
2. However there is a new parenchymal opacity in the right lung base
possibly in the right middle lobe most consistent with pneumonia.

## 2019-11-29 DIAGNOSIS — H6980 Other specified disorders of Eustachian tube, unspecified ear: Secondary | ICD-10-CM | POA: Diagnosis not present

## 2019-11-29 DIAGNOSIS — H663X9 Other chronic suppurative otitis media, unspecified ear: Secondary | ICD-10-CM | POA: Diagnosis not present

## 2019-11-29 DIAGNOSIS — Z85818 Personal history of malignant neoplasm of other sites of lip, oral cavity, and pharynx: Secondary | ICD-10-CM | POA: Diagnosis not present

## 2019-12-01 ENCOUNTER — Other Ambulatory Visit: Payer: Self-pay

## 2019-12-01 ENCOUNTER — Encounter: Payer: Self-pay | Admitting: Otolaryngology

## 2019-12-01 NOTE — Anesthesia Preprocedure Evaluation (Addendum)
Anesthesia Evaluation  Patient identified by MRN, date of birth, ID band Patient awake    Reviewed: Allergy & Precautions, H&P , NPO status , Patient's Chart, lab work & pertinent test results  History of Anesthesia Complications (+) DIFFICULT AIRWAY and history of anesthetic complications  Airway Mallampati: II  TM Distance: >3 FB Neck ROM: limited  Mouth opening: Limited Mouth Opening  Dental no notable dental hx.    Pulmonary    Pulmonary exam normal breath sounds clear to auscultation       Cardiovascular hypertension, Normal cardiovascular exam Rhythm:regular Rate:Normal     Neuro/Psych    GI/Hepatic GERD  ,  Endo/Other  Hypothyroidism   Renal/GU      Musculoskeletal   Abdominal   Peds  Hematology   Anesthesia Other Findings H/o pharyngeal surgery, with limited mouth opening.  Reproductive/Obstetrics                            Anesthesia Physical Anesthesia Plan  ASA: III  Anesthesia Plan: General   Post-op Pain Management:    Induction:   PONV Risk Score and Plan: 3 and Treatment may vary due to age or medical condition, Ondansetron and TIVA  Airway Management Planned: Mask  Additional Equipment:   Intra-op Plan:   Post-operative Plan:   Informed Consent: I have reviewed the patients History and Physical, chart, labs and discussed the procedure including the risks, benefits and alternatives for the proposed anesthesia with the patient or authorized representative who has indicated his/her understanding and acceptance.     Dental Advisory Given  Plan Discussed with: CRNA  Anesthesia Plan Comments:         Anesthesia Quick Evaluation

## 2019-12-02 DIAGNOSIS — H90A31 Mixed conductive and sensorineural hearing loss, unilateral, right ear with restricted hearing on the contralateral side: Secondary | ICD-10-CM | POA: Diagnosis not present

## 2019-12-07 ENCOUNTER — Other Ambulatory Visit
Admission: RE | Admit: 2019-12-07 | Discharge: 2019-12-07 | Disposition: A | Discharge status: Home / Self Care | Payer: Medicare HMO | Source: Ambulatory Visit | Attending: Otolaryngology | Admitting: Otolaryngology

## 2019-12-07 DIAGNOSIS — Z20822 Contact with and (suspected) exposure to covid-19: Secondary | ICD-10-CM | POA: Insufficient documentation

## 2019-12-07 DIAGNOSIS — Z01812 Encounter for preprocedural laboratory examination: Secondary | ICD-10-CM | POA: Diagnosis not present

## 2019-12-07 LAB — SARS CORONAVIRUS 2 (TAT 6-24 HRS): SARS Coronavirus 2: NEGATIVE

## 2019-12-09 ENCOUNTER — Encounter
Admission: RE | Disposition: A | Discharge status: Home / Self Care | Payer: Self-pay | Source: Home / Self Care | Attending: Otolaryngology

## 2019-12-09 ENCOUNTER — Other Ambulatory Visit: Payer: Self-pay

## 2019-12-09 ENCOUNTER — Ambulatory Visit
Admission: RE | Admit: 2019-12-09 | Discharge: 2019-12-09 | Disposition: A | Discharge status: Home / Self Care | Payer: Medicare HMO | Attending: Otolaryngology | Admitting: Otolaryngology

## 2019-12-09 ENCOUNTER — Ambulatory Visit: Payer: Medicare HMO | Admitting: Anesthesiology

## 2019-12-09 ENCOUNTER — Encounter: Payer: Self-pay | Admitting: Otolaryngology

## 2019-12-09 DIAGNOSIS — H663X1 Other chronic suppurative otitis media, right ear: Secondary | ICD-10-CM | POA: Insufficient documentation

## 2019-12-09 DIAGNOSIS — I10 Essential (primary) hypertension: Secondary | ICD-10-CM | POA: Insufficient documentation

## 2019-12-09 DIAGNOSIS — Z79899 Other long term (current) drug therapy: Secondary | ICD-10-CM | POA: Insufficient documentation

## 2019-12-09 DIAGNOSIS — H6121 Impacted cerumen, right ear: Secondary | ICD-10-CM | POA: Diagnosis not present

## 2019-12-09 DIAGNOSIS — Z79890 Hormone replacement therapy (postmenopausal): Secondary | ICD-10-CM | POA: Diagnosis not present

## 2019-12-09 DIAGNOSIS — H7291 Unspecified perforation of tympanic membrane, right ear: Secondary | ICD-10-CM | POA: Insufficient documentation

## 2019-12-09 DIAGNOSIS — H6981 Other specified disorders of Eustachian tube, right ear: Secondary | ICD-10-CM | POA: Diagnosis not present

## 2019-12-09 DIAGNOSIS — E039 Hypothyroidism, unspecified: Secondary | ICD-10-CM | POA: Diagnosis not present

## 2019-12-09 DIAGNOSIS — H722X1 Other marginal perforations of tympanic membrane, right ear: Secondary | ICD-10-CM | POA: Diagnosis not present

## 2019-12-09 DIAGNOSIS — K219 Gastro-esophageal reflux disease without esophagitis: Secondary | ICD-10-CM | POA: Diagnosis not present

## 2019-12-09 HISTORY — DX: Gastrostomy status: Z93.1

## 2019-12-09 HISTORY — PX: MYRINGOTOMY WITH TUBE PLACEMENT: SHX5663

## 2019-12-09 HISTORY — DX: Dizziness and giddiness: R42

## 2019-12-09 SURGERY — MYRINGOTOMY WITH TUBE PLACEMENT
Anesthesia: General | Site: Ear | Laterality: Right

## 2019-12-09 MED ORDER — LIDOCAINE HCL (CARDIAC) PF 100 MG/5ML IV SOSY
PREFILLED_SYRINGE | INTRAVENOUS | Status: DC | PRN
  Administered 2019-12-09: 30 mg via INTRAVENOUS

## 2019-12-09 MED ORDER — EPHEDRINE SULFATE 50 MG/ML IJ SOLN
INTRAMUSCULAR | Status: DC | PRN
  Administered 2019-12-09: 5 mg via INTRAVENOUS

## 2019-12-09 MED ORDER — ACETAMINOPHEN 160 MG/5ML PO SOLN: 325.0000 mg | Freq: Once | ORAL | Status: DC

## 2019-12-09 MED ORDER — PROPOFOL 10 MG/ML IV BOLUS
INTRAVENOUS | Status: DC | PRN
  Administered 2019-12-09: 100 mg via INTRAVENOUS

## 2019-12-09 MED ORDER — LACTATED RINGERS IV SOLN: INTRAVENOUS | Status: DC

## 2019-12-09 MED ORDER — ACETAMINOPHEN 325 MG PO TABS: 325.0000 mg | ORAL_TABLET | Freq: Once | ORAL | Status: DC

## 2019-12-09 SURGICAL SUPPLY — 10 items
BLADE MYR LANCE NRW W/HDL (BLADE) ×1 IMPLANT
CANISTER SUCT 1200ML W/VALVE (MISCELLANEOUS) ×1 IMPLANT
COTTONBALL LRG STERILE PKG (GAUZE/BANDAGES/DRESSINGS) ×3 IMPLANT
GLOVE PI ULTRA LF STRL 7.5 (GLOVE) ×1 IMPLANT
GLOVE PI ULTRA NON LATEX 7.5 (GLOVE) ×4
STRAP BODY AND KNEE 60X3 (MISCELLANEOUS) ×1 IMPLANT
TOWEL OR 17X26 4PK STRL BLUE (TOWEL DISPOSABLE) ×3 IMPLANT
TUBE EAR T 1.27X5.3 BFLY (OTOLOGIC RELATED) IMPLANT
TUBING CONN 6MMX3.1M (TUBING) ×2
TUBING SUCTION CONN 0.25 STRL (TUBING) ×1 IMPLANT

## 2019-12-09 NOTE — H&P (Signed)
H&P has been reviewed and patient reevaluated, no changes necessary. To be downloaded later.  

## 2019-12-09 NOTE — Discharge Instructions (Signed)
[facility] DISCHARGE INSTRUCTIONS FOR MYRINGOTOMY AND TUBE INSERTION  [facility] Margaretha Sheffield, M.D. Roena Malady, M.D. Malon Kindle, M.D. Carloyn Manner, M.D.  Diet:   After surgery, the patient should take only liquids and foods as tolerated.  The patient may then have a regular diet after the effects of anesthesia have worn off, usually about four to six hours after surgery.  Activities:   The patient should rest until the effects of anesthesia have worn off.  After this, there are no restrictions on the normal daily activities.  Earplugs:   Earplugs are only needed for those who are going to be submerged under water.  When taking a bath or shower and using a cup or showerhead to rinse hair, it is not necessary to wear earplugs.  These come in a variety of fashions, all of which can be obtained at our office.  However, if one is not able to come by the office, then silicone plugs can be found at most pharmacies.  It is not advised to stick anything in the ear that is not approved as an earplug.  Silly putty is not to be used as an earplug.  Swimming is allowed in patients after ear tubes are inserted, however, they must wear earplugs if they are going to be submerged under water.  For those children who are going to be swimming a lot, it is recommended to use a fitted ear mold, which can be made by our audiologist.  If discharge is noticed from the ears, this most likely represents an ear infection.  We would recommend getting your eardrops and using them as indicated above.  If it does not clear, then you should call the ENT office.  For follow up, the patient should return to the ENT office three weeks postoperatively and then every six months as required by the doctor.   General Anesthesia, Adult, Care After This sheet gives you information about how to care for yourself after your procedure. Your health care provider may also give you more specific  instructions. If you have problems or questions, contact your health care provider. What can I expect after the procedure? After the procedure, the following side effects are common:  Pain or discomfort at the IV site.  Nausea.  Vomiting.  Sore throat.  Trouble concentrating.  Feeling cold or chills.  Weak or tired.  Sleepiness and fatigue.  Soreness and body aches. These side effects can affect parts of the body that were not involved in surgery. Follow these instructions at home:  For at least 24 hours after the procedure:  Have a responsible adult stay with you. It is important to have someone help care for you until you are awake and alert.  Rest as needed.  Do not: ? Participate in activities in which you could fall or become injured. ? Drive. ? Use heavy machinery. ? Drink alcohol. ? Take sleeping pills or medicines that cause drowsiness. ? Make important decisions or sign legal documents. ? Take care of children on your own. Eating and drinking  Follow any instructions from your health care provider about eating or drinking restrictions.  When you feel hungry, start by eating small amounts of foods that are soft and easy to digest (bland), such as toast. Gradually return to your regular diet.  Drink enough fluid to keep your urine pale yellow.  If you vomit, rehydrate by drinking water, juice, or clear broth. General instructions  If  you have sleep apnea, surgery and certain medicines can increase your risk for breathing problems. Follow instructions from your health care provider about wearing your sleep device: ? Anytime you are sleeping, including during daytime naps. ? While taking prescription pain medicines, sleeping medicines, or medicines that make you drowsy.  Return to your normal activities as told by your health care provider. Ask your health care provider what activities are safe for you.  Take over-the-counter and prescription medicines only  as told by your health care provider.  If you smoke, do not smoke without supervision.  Keep all follow-up visits as told by your health care provider. This is important. Contact a health care provider if:  You have nausea or vomiting that does not get better with medicine.  You cannot eat or drink without vomiting.  You have pain that does not get better with medicine.  You are unable to pass urine.  You develop a skin rash.  You have a fever.  You have redness around your IV site that gets worse. Get help right away if:  You have difficulty breathing.  You have chest pain.  You have blood in your urine or stool, or you vomit blood. Summary  After the procedure, it is common to have a sore throat or nausea. It is also common to feel tired.  Have a responsible adult stay with you for the first 24 hours after general anesthesia. It is important to have someone help care for you until you are awake and alert.  When you feel hungry, start by eating small amounts of foods that are soft and easy to digest (bland), such as toast. Gradually return to your regular diet.  Drink enough fluid to keep your urine pale yellow.  Return to your normal activities as told by your health care provider. Ask your health care provider what activities are safe for you. This information is not intended to replace advice given to you by your health care provider. Make sure you discuss any questions you have with your health care provider. Document Revised: 07/11/2017 Document Reviewed: 02/21/2017 Elsevier Patient Education  [place].

## 2019-12-09 NOTE — Transfer of Care (Signed)
Immediate Anesthesia Transfer of Care Note  Patient: Crystal Kent  Procedure(s) Performed: [name] UNDER ANESTHESIA REMOVAL OF CERUMEN (Right Ear)  Patient Location: PACU  Anesthesia Type: General  Level of Consciousness: awake, alert  and patient cooperative  Airway and Oxygen Therapy: Patient Spontanous Breathing and Patient connected to supplemental oxygen  Post-op Assessment: Post-op Vital signs reviewed, Patient's Cardiovascular Status Stable, Respiratory Function Stable, Patent Airway and No signs of Nausea or vomiting  Post-op Vital Signs: Reviewed and stable  Complications: No apparent anesthesia complications

## 2019-12-09 NOTE — Anesthesia Procedure Notes (Signed)
Procedure Name: General with mask airway Performed by: Cameron Ali, CRNA Pre-anesthesia Checklist: Patient identified, Emergency Drugs available, Suction available, Timeout performed and Patient being monitored Patient Re-evaluated:Patient Re-evaluated prior to induction Oxygen Delivery Method: Circle system utilized Preoxygenation: Pre-oxygenation with 100% oxygen Induction Type: Inhalational induction Ventilation: Mask ventilation without difficulty and Mask ventilation throughout procedure Dental Injury: Teeth and Oropharynx as per pre-operative assessment

## 2019-12-09 NOTE — Anesthesia Postprocedure Evaluation (Signed)
Anesthesia Post Note  Patient: Crystal Kent  Procedure(s) Performed: [name] UNDER ANESTHESIA REMOVAL OF CERUMEN (Right Ear)     Patient location during evaluation: PACU Anesthesia Type: General Level of consciousness: awake and alert and oriented Pain management: satisfactory to patient Vital Signs Assessment: post-procedure vital signs reviewed and stable Respiratory status: spontaneous breathing, nonlabored ventilation and respiratory function stable Cardiovascular status: blood pressure returned to baseline and stable Postop Assessment: Adequate PO intake and No signs of nausea or vomiting Anesthetic complications: no    Raliegh Ip

## 2019-12-09 NOTE — Op Note (Signed)
12/09/2019  7:45 AM    Crystal Kent  [id number]   Pre-Op Dx: Chronic eustachian tube dysfunction, wax impaction  Post-op Dx: Chronic eustachian tube dysfunction, wax impaction, TM perforation  Proc: Examined air under anesthesia with removal of wax impaction  Surg:  Crystal Kent  Anes:  GOT  EBL: None  Comp: None  Findings: Ear canal was filled up with very dry hard wax such that could not see the eardrum.  The wax was removed under anesthesia be able now visualize the ear canal and eardrum.  The butterfly tube was gone.  The ear drum had a large dry perforation in the inferior area, a bridge of the eardrum anteriorly and then a more superior anterior perforation as well.  I looked very hard to make sure there was not a monomer or complete retraction but this definitely seem to be a large perforation secondary to the butterfly tube coming out.  The middle ear space was clear with no signs of inflammation or granulation at all.  Procedure: Patient was brought to the operating room and placed in the supine position.  She was given general anesthesia by mask and IV.  The right ear canal was visualized under high-power microscope.  There is very hard wax that was dried and stuck to the skin of the external canal.  This was completely blocking the ear canal.  This was freed up from the skin and the entire ball of wax with dead skin attached to it was removed.  I could now see the eardrum fully.  The eardrum had a large inferior perforation with a bridge of eardrum and a separate anterior superior perforation.  There was not a monomer but this was indeed a perforation.  Because of that I did not perform any further surgery but left this clean and dry.  Patient was awakened and taken to the recovery room in satisfactory condition.  There were no operative complications.  She does not need any eardrops but needs to keep her ear very clean and dry.  Dispo:   To PACU to be discharged  home  Plan: To follow-up in the office in a couple weeks to have a hearing test and evaluate this further.  [name]  12/09/2019 7:45 AM

## 2019-12-10 ENCOUNTER — Encounter: Payer: Self-pay | Admitting: *Deleted

## 2019-12-16 DIAGNOSIS — Z8589 Personal history of malignant neoplasm of other organs and systems: Secondary | ICD-10-CM | POA: Diagnosis not present

## 2019-12-16 DIAGNOSIS — C01 Malignant neoplasm of base of tongue: Secondary | ICD-10-CM | POA: Diagnosis not present

## 2019-12-16 DIAGNOSIS — R1312 Dysphagia, oropharyngeal phase: Secondary | ICD-10-CM | POA: Diagnosis not present

## 2019-12-16 DIAGNOSIS — K1379 Other lesions of oral mucosa: Secondary | ICD-10-CM | POA: Diagnosis not present

## 2019-12-16 DIAGNOSIS — C050 Malignant neoplasm of hard palate: Secondary | ICD-10-CM | POA: Diagnosis not present

## 2019-12-18 ENCOUNTER — Ambulatory Visit
Admission: EM | Admit: 2019-12-18 | Discharge: 2019-12-18 | Disposition: A | Discharge status: Home / Self Care | Payer: Medicare HMO | Attending: Urgent Care | Admitting: Urgent Care

## 2019-12-18 ENCOUNTER — Ambulatory Visit (INDEPENDENT_AMBULATORY_CARE_PROVIDER_SITE_OTHER): Payer: Medicare HMO

## 2019-12-18 ENCOUNTER — Other Ambulatory Visit: Payer: Self-pay

## 2019-12-18 ENCOUNTER — Encounter: Payer: Self-pay | Admitting: Emergency Medicine

## 2019-12-18 DIAGNOSIS — J849 Interstitial pulmonary disease, unspecified: Secondary | ICD-10-CM

## 2019-12-18 DIAGNOSIS — R062 Wheezing: Secondary | ICD-10-CM

## 2019-12-18 DIAGNOSIS — R059 Cough, unspecified: Secondary | ICD-10-CM

## 2019-12-18 DIAGNOSIS — R509 Fever, unspecified: Secondary | ICD-10-CM | POA: Diagnosis not present

## 2019-12-18 DIAGNOSIS — R05 Cough: Secondary | ICD-10-CM | POA: Diagnosis not present

## 2019-12-18 MED ORDER — BENZONATATE 200 MG PO CAPS
200.0000 mg | ORAL_CAPSULE | Freq: Three times a day (TID) | ORAL | 0 refills | Status: DC | PRN
Start: 2019-12-18 — End: 2019-12-18

## 2019-12-18 MED ORDER — PREDNISONE 10 MG (21) PO TBPK
ORAL_TABLET | Freq: Every day | ORAL | 0 refills | Status: DC
Start: 2019-12-18 — End: 2020-02-06

## 2019-12-18 MED ORDER — HYDROCOD POLST-CPM POLST ER 10-8 MG/5ML PO SUER: 5.0000 mL | Freq: Two times a day (BID) | ORAL | 0 refills | Status: DC | PRN

## 2019-12-18 MED ORDER — LEVOFLOXACIN 500 MG PO TABS
500.0000 mg | ORAL_TABLET | Freq: Every day | ORAL | 0 refills | Status: DC
Start: 2019-12-18 — End: 2020-02-06

## 2019-12-18 NOTE — ED Triage Notes (Signed)
Patient c/o cough and chest congestion for several months.  Patient reports fever off and on.  Patient states that she was diagnosed with pneumonia back in April of this year.

## 2019-12-18 NOTE — Discharge Instructions (Addendum)
It was very nice seeing you today in clinic. Thank you for entrusting me with your care.   Rest and make sure you are staying well hydrated. Please utilize the medications that we discussed. Your prescriptions has been called in to your pharmacy.   Given the chronicity of your symptoms, I think you would benefit from seeing pulmonology. I have provided you the name and office contact information for an excellent local provider. They should contact you to schedule an appointment. If your symptoms/condition worsens, please seek follow up care either here or in the ER. Please remember, our [place] providers are "right here with you" when you need [place].   Again, it was my pleasure to take care of you today. Thank you for choosing our clinic. I hope that you start to feel better quickly.   Honor Loh, MSN, APRN, FNP-C, CEN Advanced Practice Provider [facility]

## 2019-12-19 NOTE — ED Provider Notes (Signed)
[place], [place]   Name: Crystal Kent DOB: March 09, 1946 MRN: [id number] CSN: [id number] PCP: Barbaraann Boys, MD  Arrival date and time:  12/18/19 1307  Chief Complaint:  Cough  NOTE: Prior to seeing the patient today, I have reviewed the triage nursing documentation and vital signs. Clinical staff has updated patient's PMH/PSHx, current medication list, and drug allergies/intolerances to ensure comprehensive history available to assist in medical decision making.   History:   HPI: Crystal Kent is a 74 y.o. female who presents today with complaints of cough and chest congestion that has been going on intermittently "for years".  Patient notes that her symptoms have been worsening over the last few months.  Patient was seen here in April 2021 and diagnosed with pneumonia; notes reviewed.  At that time, patient was treated with a course of Augmentin and azithromycin.  She presents today advising that her symptoms improved, however are now getting worse again.  Patient reporting a productive cough and increased shortness of breath.  She reports a subjective fever to an unknown Tmax. Patient denies being in close contact with anyone known to be ill.  Patient recently tested for SARS-CoV-2 (novel coronavirus) on 12/07/2019 prior to procedure; results negative. SPO2 noted to be lower today in clinic as compared to her previous visit; oxygen saturation 94% on room air.  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  Past Medical History:  Diagnosis Date  . Acid reflux   . Allergy   . 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 Difficult intubation    Pt reports "small airway"  . Feeding by G-tube ([place])    Nothing by mouth  . Hypertension   . Personal history of chemotherapy 1999  . Personal history of radiation therapy   . Thyroid disease   . Uses prosthesis    plate to cover hole in roof of mouth s/p  cancer surgery, doesn't wear anymore  . Vertigo    no episodes for several years    Past Surgical History:  Procedure Laterality Date  . ABDOMINAL HYSTERECTOMY    . BREAST BIOPSY Left    bx/clip-neg  . hemimandibulectomy Right 1998  . MYRINGOTOMY WITH TUBE PLACEMENT Right 02/27/2017   Procedure: MYRINGOTOMY WITH TUBE PLACEMENT Right ear.;  Surgeon: Margaretha Sheffield, MD;  Location: [facility];  Service: ENT;  Laterality: Right;  . MYRINGOTOMY WITH TUBE PLACEMENT Right 12/09/2019   Procedure: EXAM UNDER ANESTHESIA REMOVAL OF CERUMEN;  Surgeon: Margaretha Sheffield, MD;  Location: [facility];  Service: ENT;  Laterality: Right;  . partial maxillectomy Right   . PEG PLACEMENT N/A 05/27/2017   Procedure: PERCUTANEOUS ENDOSCOPIC GASTROSTOMY (PEG) PLACEMENT;  Surgeon: Lucilla Lame, MD;  Location: [facility];  Service: Endoscopy;  Laterality: N/A;  . PEG PLACEMENT N/A 09/07/2018   Procedure: PERCUTANEOUS ENDOSCOPIC GASTROSTOMY (PEG) PLACEMENT;  Surgeon: Lucilla Lame, MD;  Location: [facility];  Service: Endoscopy;  Laterality: N/A;  . RADICAL NECK DISSECTION Right 1998   multiple oral cancers    Family History  Problem Relation Age of Onset  . Alzheimer's disease Mother   . Heart attack Father   . Healthy Son   . Healthy Son   . Breast cancer Neg Hx     Social History   Tobacco Use  . Smoking status: Never Smoker  . Smokeless tobacco: Never Used  . Tobacco comment: Smoking cessation materials not required  Substance Use Topics  . Alcohol use: No  . Drug use: No    Patient Active Problem List   Diagnosis Date Noted  . Mechanical complication of gastrostomy ([place])   . Oral candida 03/16/2018  . Tinea corporis 12/29/2017  . Insomnia 04/23/2017  . 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 Essential hypertension 06/28/2016  . Allergic rhinitis 06/28/2016  . Current use of proton pump inhibitor 06/28/2016  . Gastrostomy in place [facility]) 05/02/2016  . Chronic  pansinusitis 02/13/2016  . Eustachian tube dysfunction 12/14/2014  . Hearing loss 12/14/2014  . Lesion of buccal mucosa 12/14/2014  . Dysphagia, oropharyngeal 05/23/2014  . Hypothyroidism 04/27/2014  . 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 acetaminophen-codeine (TYLENOL #3) 300-30 MG tablet Take by mouth every 4 (four) hours as needed for moderate pain (Takes BID).  Marland Kitchen amLODipine (NORVASC) 5 MG tablet Take 1 tablet (5 mg total) by mouth daily.  . celecoxib (CELEBREX) 100 MG capsule Take 100 mg by mouth 2 (two) times daily.  . fexofenadine (ALLEGRA) 180 MG tablet Take 180 mg by mouth daily as needed for allergies or rhinitis.  . fluticasone (FLONASE) 50 MCG/ACT nasal spray Place 2 sprays into both nostrils daily as needed.  Marland Kitchen levothyroxine (SYNTHROID, LEVOTHROID) 75 MCG tablet Take 1 tablet (75 mcg total) by mouth daily.  Marland Kitchen omeprazole (PRILOSEC) 20 MG capsule TAKE 1 CAPSULE EVERY DAY  . traMADol (ULTRAM) 50 MG tablet Take by mouth every 6 (six) hours as needed.    Allergies:   Clavulanic acid  Review of Systems (ROS):  Review of systems NEGATIVE unless otherwise noted in narrative H&P section.   Vital Signs: Today's Vitals   12/18/19 1316 12/18/19 1319 12/18/19 1348  BP:  (!) 143/89   Pulse:  97   Resp:  14   Temp:  98 F (36.7 C)   TempSrc:  Axillary   SpO2:  94%   Weight: 102 lb 15.3 oz (46.7 kg)    PainSc: 7   7     Physical Exam: Physical Exam  Constitutional: She is oriented to person, place, and time and well-developed, well-nourished, and in no distress.  Chronically ill-appearing.   HENT:  Head: Normocephalic and atraumatic.  Oral defects following surgical intervention related to her head/neck cancer.  Eyes: Pupils are equal, round, and reactive to light.  Cardiovascular: Normal rate,  regular rhythm, normal heart sounds and intact distal pulses.  Pulmonary/Chest: Effort normal. She has decreased breath sounds in the right lower field and the left lower field. She has wheezes (Scattered expiratory). She has rhonchi (Diffusely scattered).  Moderate cough noted in clinic. Mild SOB. No increased WOB. No distress. Able to speak in complete sentences without difficulties. SPO2 94% on RA.  Neurological: She is alert and oriented to person, place, and time. Gait normal.  Skin: Skin is warm and dry. No rash noted. She is not diaphoretic.  Psychiatric: Mood, memory, affect and judgment normal.  Nursing note and vitals reviewed.   Urgent Care Treatments / Results:   Orders Placed This Encounter  Procedures  . DG Chest 2 View  . Ambulatory referral to Pulmonology    LABS: PLEASE NOTE: all labs that were ordered this encounter are listed, however only abnormal results are displayed. Labs Reviewed - No data to display  EKG: -None  RADIOLOGY: DG Chest 2 View  Result Date: 12/18/2019 CLINICAL DATA:  Patient c/o cough and chest congestion for several months. Patient reports fever off and on. Patient states that she was diagnosed with pneumonia back in April of this year. EXAM: CHEST - 2 VIEW COMPARISON:  Chest radiograph 12/24/2018 FINDINGS: Stable cardiomediastinal contours with normal heart size. There are chronic bilateral basilar predominant interstitial opacities. No new focal consolidation. No pneumothorax or pleural effusion. No acute finding in the visualized skeleton. IMPRESSION: 1.  No active cardiopulmonary process. 2.  Chronic bilateral interstitial opacities. Electronically Signed   By: Audie Pinto M.D.   On: 12/18/2019 13:40    PROCEDURES: Procedures  MEDICATIONS RECEIVED THIS VISIT: Medications - No data to display  PERTINENT CLINICAL COURSE NOTES/UPDATES:   Initial Impression / Assessment and Plan / Urgent Care Course:  Pertinent labs & imaging results  that were available during my care of the patient were personally reviewed by me and considered in my medical decision making (see lab/imaging section of note for values and interpretations).  Crystal Kent is a 74 y.o. female who presents to [facility] today with complaints of Cough  Patient is well appearing overall in clinic today. She does not appear to be in any acute distress. Presenting symptoms (see HPI) and exam as documented above.  Exam reveals scattered rhonchi and wheezing.  Patient reports that cough and shortness of breath seems to be worsening.  Repeat chest radiographs today reveal chronic bilateral interstitial opacities.  Given her report of worsening respiratory status overall, this could represent a persistent pneumonia versus inflammatory process.  Treating with a 7-day course of levofloxacin and a systemic steroid taper.  Reviewed supportive care; rest, increased hydration, and as needed use of APAP/IBU for any discomfort.  Patient initially provided a prescription for benzonatate, however she notes that she is unable to use this medication due to it being gel capsule.  She is asking for alternate intervention.  Patient reports the cough is keeping her up at night, thus a short course of Tussionex is deemed to be appropriate.  Given the chronicity of her symptoms and overall respiratory status, I feel the patient would benefit from seeing PCCM.  Referral entered to Dr. Vernard Gambles.  I have reviewed the follow up and strict return precautions for any new or worsening symptoms. Patient is aware of symptoms that would be deemed urgent/emergent, and would thus require further evaluation either here or in the emergency department. At the time of discharge, she verbalized understanding and consent with the discharge plan as it was reviewed with her. All questions were fielded by provider and/or clinic staff prior to patient discharge.    Final Clinical Impressions / Urgent  Care Diagnoses:   Final diagnoses:  Pneumonia, interstitial ([place])  Cough  Wheezing    New Prescriptions:  [place] Controlled Substance Registry consulted? Yes, I have consulted the [place] Controlled Substances Registry for this patient, and feel the risk/benefit ratio today is favorable for proceeding with this prescription for a controlled substance. . Discussed use of controlled substance medication to treat her acute symptoms.  o Reviewed [place] STOP Act regulations  o Clinic does not refill controlled substances over the phone without face to face evaluation.  . Safety precautions reviewed.  o Medications should not be sold or taken with alcohol.  o Avoid use while working, driving, or operating heavy machinery.  o Side effects associated with the use of this particular medication reviewed. - Patient understands  that this medication can cause CNS depression, increase her risk of falls, and even lead to overdose that may result in death, if used outside of the parameters that she and I discussed.  With all of this in mind, she knowingly accepts the risks and responsibilities associated with intended course of treatment, and elects to responsibly proceed as discussed.  Meds ordered this encounter  Medications  . levofloxacin (LEVAQUIN) 500 MG tablet    Sig: Take 1 tablet (500 mg total) by mouth daily.    Dispense:  7 tablet    Refill:  0  . predniSONE (STERAPRED UNI-PAK 21 TAB) 10 MG (21) TBPK tablet    Sig: Take by mouth daily. 60 mg x 1 day, 50 mg x 1 day, 40 mg x 1 day, 30 mg x 1 day, 20 mg x 1 day, 10 mg x 1 day    Dispense:  21 tablet    Refill:  0  . DISCONTD: benzonatate (TESSALON) 200 MG capsule    Sig: Take 1 capsule (200 mg total) by mouth 3 (three) times daily as needed for cough.    Dispense:  21 capsule    Refill:  0  . chlorpheniramine-HYDROcodone (TUSSIONEX PENNKINETIC ER) 10-8 MG/5ML SUER    Sig: Take 5 mLs by mouth every 12 (twelve) hours as needed for cough. Will cause  drowsiness; NO DRIVING.    Dispense:  70 mL    Refill:  0    Cancel Tessalon Rx; using Tussionex instead    Recommended Follow up Care:  Patient encouraged to follow up with the following provider within the specified time frame, or sooner as dictated by the severity of her symptoms. As always, she was instructed that for any urgent/emergent care needs, she should seek care either here or in the emergency department for more immediate evaluation.  Follow-up Information    Tyler Pita, MD.   Specialty: Pulmonary Disease Why: They should call you for an appointment. Contact information: [place] [address] [phone]         NOTE: This note was prepared using Dragon dictation software along with smaller phrase technology. Despite my best ability to proofread, there is the potential that transcriptional errors may still occur from this process, and are completely unintentional.    Karen Kitchens, NP 12/19/19 [phone]

## 2019-12-21 DIAGNOSIS — R1312 Dysphagia, oropharyngeal phase: Secondary | ICD-10-CM | POA: Diagnosis not present

## 2019-12-21 DIAGNOSIS — R911 Solitary pulmonary nodule: Secondary | ICD-10-CM | POA: Diagnosis not present

## 2019-12-21 DIAGNOSIS — Z8701 Personal history of pneumonia (recurrent): Secondary | ICD-10-CM | POA: Diagnosis not present

## 2019-12-21 DIAGNOSIS — Z931 Gastrostomy status: Secondary | ICD-10-CM | POA: Diagnosis not present

## 2019-12-21 DIAGNOSIS — I10 Essential (primary) hypertension: Secondary | ICD-10-CM | POA: Diagnosis not present

## 2019-12-21 DIAGNOSIS — C801 Malignant (primary) neoplasm, unspecified: Secondary | ICD-10-CM | POA: Diagnosis not present

## 2019-12-24 DIAGNOSIS — H7200 Central perforation of tympanic membrane, unspecified ear: Secondary | ICD-10-CM | POA: Diagnosis not present

## 2019-12-24 DIAGNOSIS — H90A31 Mixed conductive and sensorineural hearing loss, unilateral, right ear with restricted hearing on the contralateral side: Secondary | ICD-10-CM | POA: Diagnosis not present

## 2019-12-30 ENCOUNTER — Other Ambulatory Visit: Payer: Self-pay | Admitting: Pediatrics

## 2019-12-30 DIAGNOSIS — C801 Malignant (primary) neoplasm, unspecified: Secondary | ICD-10-CM

## 2019-12-30 DIAGNOSIS — R911 Solitary pulmonary nodule: Secondary | ICD-10-CM

## 2019-12-30 DIAGNOSIS — Z8701 Personal history of pneumonia (recurrent): Secondary | ICD-10-CM

## 2020-01-03 ENCOUNTER — Other Ambulatory Visit: Payer: Self-pay | Admitting: Pulmonary Disease

## 2020-01-03 DIAGNOSIS — J189 Pneumonia, unspecified organism: Secondary | ICD-10-CM | POA: Diagnosis not present

## 2020-01-04 DIAGNOSIS — Z79891 Long term (current) use of opiate analgesic: Secondary | ICD-10-CM | POA: Diagnosis not present

## 2020-01-04 DIAGNOSIS — Z5181 Encounter for therapeutic drug level monitoring: Secondary | ICD-10-CM | POA: Diagnosis not present

## 2020-01-04 DIAGNOSIS — G893 Neoplasm related pain (acute) (chronic): Secondary | ICD-10-CM | POA: Diagnosis not present

## 2020-01-05 ENCOUNTER — Other Ambulatory Visit: Payer: Self-pay

## 2020-01-05 ENCOUNTER — Ambulatory Visit
Admission: RE | Admit: 2020-01-05 | Discharge: 2020-01-05 | Disposition: A | Discharge status: Home / Self Care | Payer: Medicare HMO | Source: Ambulatory Visit | Attending: Pediatrics | Admitting: Pediatrics

## 2020-01-05 DIAGNOSIS — J479 Bronchiectasis, uncomplicated: Secondary | ICD-10-CM | POA: Diagnosis not present

## 2020-01-05 DIAGNOSIS — C801 Malignant (primary) neoplasm, unspecified: Secondary | ICD-10-CM | POA: Diagnosis not present

## 2020-01-05 DIAGNOSIS — Z8701 Personal history of pneumonia (recurrent): Secondary | ICD-10-CM

## 2020-01-05 DIAGNOSIS — R911 Solitary pulmonary nodule: Secondary | ICD-10-CM | POA: Insufficient documentation

## 2020-01-11 DIAGNOSIS — R6884 Jaw pain: Secondary | ICD-10-CM | POA: Diagnosis not present

## 2020-01-11 DIAGNOSIS — G893 Neoplasm related pain (acute) (chronic): Secondary | ICD-10-CM | POA: Diagnosis not present

## 2020-01-11 DIAGNOSIS — R519 Headache, unspecified: Secondary | ICD-10-CM | POA: Diagnosis not present

## 2020-01-19 DIAGNOSIS — Z0100 Encounter for examination of eyes and vision without abnormal findings: Secondary | ICD-10-CM | POA: Diagnosis not present

## 2020-01-19 DIAGNOSIS — H524 Presbyopia: Secondary | ICD-10-CM | POA: Diagnosis not present

## 2020-01-25 DIAGNOSIS — J189 Pneumonia, unspecified organism: Secondary | ICD-10-CM | POA: Diagnosis not present

## 2020-01-26 DIAGNOSIS — J189 Pneumonia, unspecified organism: Secondary | ICD-10-CM | POA: Diagnosis not present

## 2020-01-28 DIAGNOSIS — J189 Pneumonia, unspecified organism: Secondary | ICD-10-CM | POA: Diagnosis not present

## 2020-02-06 ENCOUNTER — Emergency Department: Payer: Medicare HMO

## 2020-02-06 ENCOUNTER — Inpatient Hospital Stay
Admission: EM | Admit: 2020-02-06 | Discharge: 2020-02-10 | DRG: 177 | Disposition: A | Discharge status: Home Health | Payer: Medicare HMO | Attending: Internal Medicine | Admitting: Internal Medicine

## 2020-02-06 ENCOUNTER — Ambulatory Visit (INDEPENDENT_AMBULATORY_CARE_PROVIDER_SITE_OTHER)
Admission: EM | Admit: 2020-02-06 | Discharge: 2020-02-06 | Disposition: A | Discharge status: Other Acute Inpatient Hospital | Payer: Medicare HMO | Source: Home / Self Care | Attending: Family Medicine | Admitting: Family Medicine

## 2020-02-06 ENCOUNTER — Other Ambulatory Visit: Payer: Self-pay

## 2020-02-06 DIAGNOSIS — Z888 Allergy status to other drugs, medicaments and biological substances status: Secondary | ICD-10-CM

## 2020-02-06 DIAGNOSIS — R21 Rash and other nonspecific skin eruption: Secondary | ICD-10-CM | POA: Diagnosis not present

## 2020-02-06 DIAGNOSIS — Z66 Do not resuscitate: Secondary | ICD-10-CM | POA: Diagnosis present

## 2020-02-06 DIAGNOSIS — Z79890 Hormone replacement therapy (postmenopausal): Secondary | ICD-10-CM

## 2020-02-06 DIAGNOSIS — J151 Pneumonia due to Pseudomonas: Secondary | ICD-10-CM | POA: Diagnosis not present

## 2020-02-06 DIAGNOSIS — J0100 Acute maxillary sinusitis, unspecified: Secondary | ICD-10-CM

## 2020-02-06 DIAGNOSIS — Z85818 Personal history of malignant neoplasm of other sites of lip, oral cavity, and pharynx: Secondary | ICD-10-CM

## 2020-02-06 DIAGNOSIS — Z681 Body mass index (BMI) 19 or less, adult: Secondary | ICD-10-CM | POA: Diagnosis not present

## 2020-02-06 DIAGNOSIS — R0902 Hypoxemia: Secondary | ICD-10-CM

## 2020-02-06 DIAGNOSIS — Z8701 Personal history of pneumonia (recurrent): Secondary | ICD-10-CM

## 2020-02-06 DIAGNOSIS — R1310 Dysphagia, unspecified: Secondary | ICD-10-CM

## 2020-02-06 DIAGNOSIS — K9423 Gastrostomy malfunction: Secondary | ICD-10-CM | POA: Diagnosis not present

## 2020-02-06 DIAGNOSIS — T17908A Unspecified foreign body in respiratory tract, part unspecified causing other injury, initial encounter: Secondary | ICD-10-CM | POA: Diagnosis not present

## 2020-02-06 DIAGNOSIS — Z9221 Personal history of antineoplastic chemotherapy: Secondary | ICD-10-CM

## 2020-02-06 DIAGNOSIS — R000 Tachycardia, unspecified: Secondary | ICD-10-CM

## 2020-02-06 DIAGNOSIS — R9389 Abnormal findings on diagnostic imaging of other specified body structures: Secondary | ICD-10-CM

## 2020-02-06 DIAGNOSIS — I10 Essential (primary) hypertension: Secondary | ICD-10-CM | POA: Diagnosis present

## 2020-02-06 DIAGNOSIS — G893 Neoplasm related pain (acute) (chronic): Secondary | ICD-10-CM | POA: Diagnosis present

## 2020-02-06 DIAGNOSIS — Z20822 Contact with and (suspected) exposure to covid-19: Secondary | ICD-10-CM | POA: Diagnosis not present

## 2020-02-06 DIAGNOSIS — R069 Unspecified abnormalities of breathing: Secondary | ICD-10-CM | POA: Diagnosis not present

## 2020-02-06 DIAGNOSIS — J690 Pneumonitis due to inhalation of food and vomit: Principal | ICD-10-CM | POA: Diagnosis present

## 2020-02-06 DIAGNOSIS — J189 Pneumonia, unspecified organism: Secondary | ICD-10-CM

## 2020-02-06 DIAGNOSIS — R0600 Dyspnea, unspecified: Secondary | ICD-10-CM | POA: Diagnosis not present

## 2020-02-06 DIAGNOSIS — C01 Malignant neoplasm of base of tongue: Secondary | ICD-10-CM | POA: Diagnosis not present

## 2020-02-06 DIAGNOSIS — E039 Hypothyroidism, unspecified: Secondary | ICD-10-CM | POA: Diagnosis present

## 2020-02-06 DIAGNOSIS — R0689 Other abnormalities of breathing: Secondary | ICD-10-CM | POA: Diagnosis not present

## 2020-02-06 DIAGNOSIS — J90 Pleural effusion, not elsewhere classified: Secondary | ICD-10-CM | POA: Diagnosis not present

## 2020-02-06 DIAGNOSIS — Z515 Encounter for palliative care: Secondary | ICD-10-CM | POA: Diagnosis not present

## 2020-02-06 DIAGNOSIS — R0602 Shortness of breath: Secondary | ICD-10-CM | POA: Diagnosis not present

## 2020-02-06 DIAGNOSIS — E43 Unspecified severe protein-calorie malnutrition: Secondary | ICD-10-CM | POA: Diagnosis present

## 2020-02-06 DIAGNOSIS — T17908S Unspecified foreign body in respiratory tract, part unspecified causing other injury, sequela: Secondary | ICD-10-CM | POA: Diagnosis not present

## 2020-02-06 DIAGNOSIS — Z79899 Other long term (current) drug therapy: Secondary | ICD-10-CM | POA: Diagnosis not present

## 2020-02-06 DIAGNOSIS — Z8249 Family history of ischemic heart disease and other diseases of the circulatory system: Secondary | ICD-10-CM | POA: Diagnosis not present

## 2020-02-06 DIAGNOSIS — J15212 Pneumonia due to Methicillin resistant Staphylococcus aureus: Secondary | ICD-10-CM | POA: Diagnosis not present

## 2020-02-06 DIAGNOSIS — R1312 Dysphagia, oropharyngeal phase: Secondary | ICD-10-CM | POA: Diagnosis not present

## 2020-02-06 DIAGNOSIS — Z923 Personal history of irradiation: Secondary | ICD-10-CM | POA: Diagnosis not present

## 2020-02-06 DIAGNOSIS — Z7189 Other specified counseling: Secondary | ICD-10-CM | POA: Diagnosis not present

## 2020-02-06 DIAGNOSIS — I4891 Unspecified atrial fibrillation: Secondary | ICD-10-CM | POA: Diagnosis not present

## 2020-02-06 DIAGNOSIS — J9601 Acute respiratory failure with hypoxia: Secondary | ICD-10-CM | POA: Diagnosis present

## 2020-02-06 DIAGNOSIS — A419 Sepsis, unspecified organism: Secondary | ICD-10-CM | POA: Diagnosis not present

## 2020-02-06 DIAGNOSIS — K219 Gastro-esophageal reflux disease without esophagitis: Secondary | ICD-10-CM | POA: Diagnosis present

## 2020-02-06 DIAGNOSIS — G8929 Other chronic pain: Secondary | ICD-10-CM | POA: Diagnosis present

## 2020-02-06 DIAGNOSIS — R6520 Severe sepsis without septic shock: Secondary | ICD-10-CM | POA: Diagnosis not present

## 2020-02-06 DIAGNOSIS — Z434 Encounter for attention to other artificial openings of digestive tract: Secondary | ICD-10-CM | POA: Diagnosis not present

## 2020-02-06 DIAGNOSIS — R918 Other nonspecific abnormal finding of lung field: Secondary | ICD-10-CM | POA: Diagnosis not present

## 2020-02-06 LAB — CBC WITH DIFFERENTIAL/PLATELET
Abs Immature Granulocytes: 0.09 10*3/uL — ABNORMAL HIGH (ref 0.00–0.07)
Basophils Absolute: 0 10*3/uL (ref 0.0–0.1)
Basophils Relative: 0 %
Eosinophils Absolute: 0 10*3/uL (ref 0.0–0.5)
Eosinophils Relative: 0 %
HCT: 31.5 % — ABNORMAL LOW (ref 36.0–46.0)
Hemoglobin: 10.2 g/dL — ABNORMAL LOW (ref 12.0–15.0)
Immature Granulocytes: 1 %
Lymphocytes Relative: 7 %
Lymphs Abs: 0.9 10*3/uL (ref 0.7–4.0)
MCH: 30.9 pg (ref 26.0–34.0)
MCHC: 32.4 g/dL (ref 30.0–36.0)
MCV: 95.5 fL (ref 80.0–100.0)
Monocytes Absolute: 0.5 10*3/uL (ref 0.1–1.0)
Monocytes Relative: 4 %
Neutro Abs: 10.3 10*3/uL — ABNORMAL HIGH (ref 1.7–7.7)
Neutrophils Relative %: 88 %
Platelets: 269 10*3/uL (ref 150–400)
RBC: 3.3 MIL/uL — ABNORMAL LOW (ref 3.87–5.11)
RDW: 12.9 % (ref 11.5–15.5)
Smear Review: NORMAL
WBC: 11.9 10*3/uL — ABNORMAL HIGH (ref 4.0–10.5)
nRBC: 0 % (ref 0.0–0.2)

## 2020-02-06 LAB — URINALYSIS, COMPLETE (UACMP) WITH MICROSCOPIC
Bacteria, UA: NONE SEEN
Bilirubin Urine: NEGATIVE
Glucose, UA: 50 mg/dL — AB
Hgb urine dipstick: NEGATIVE
Ketones, ur: NEGATIVE mg/dL
Leukocytes,Ua: NEGATIVE
Nitrite: NEGATIVE
Protein, ur: NEGATIVE mg/dL
Specific Gravity, Urine: 1.016 (ref 1.005–1.030)
pH: 7 (ref 5.0–8.0)

## 2020-02-06 LAB — COMPREHENSIVE METABOLIC PANEL
ALT: 23 U/L (ref 0–44)
AST: 30 U/L (ref 15–41)
Albumin: 3.5 g/dL (ref 3.5–5.0)
Alkaline Phosphatase: 110 U/L (ref 38–126)
Anion gap: 10 (ref 5–15)
BUN: 20 mg/dL (ref 8–23)
CO2: 25 mmol/L (ref 22–32)
Calcium: 8.9 mg/dL (ref 8.9–10.3)
Chloride: 95 mmol/L — ABNORMAL LOW (ref 98–111)
Creatinine, Ser: 0.82 mg/dL (ref 0.44–1.00)
GFR calc Af Amer: >60 mL/min (ref >60)
GFR calc non Af Amer: >60 mL/min (ref >60)
Glucose, Bld: 110 mg/dL — ABNORMAL HIGH (ref 70–99)
Potassium: 3.8 mmol/L (ref 3.5–5.1)
Sodium: 130 mmol/L — ABNORMAL LOW (ref 135–145)
Total Bilirubin: 1.2 mg/dL (ref 0.3–1.2)
Total Protein: 7.4 g/dL (ref 6.5–8.1)

## 2020-02-06 LAB — PROTIME-INR
INR: 1.1 (ref 0.8–1.2)
Prothrombin Time: 14 seconds (ref 11.4–15.2)

## 2020-02-06 LAB — LACTIC ACID, PLASMA: Lactic Acid, Venous: 1.9 mmol/L (ref 0.5–1.9)

## 2020-02-06 LAB — SARS CORONAVIRUS 2 BY RT PCR (HOSPITAL ORDER, PERFORMED IN ~~LOC~~ HOSPITAL LAB): SARS Coronavirus 2: NEGATIVE

## 2020-02-06 LAB — LIPASE, BLOOD: Lipase: 22 U/L (ref 11–51)

## 2020-02-06 LAB — APTT: aPTT: 37 seconds — ABNORMAL HIGH (ref 24–36)

## 2020-02-06 LAB — PROCALCITONIN: Procalcitonin: 0.87 ng/mL

## 2020-02-06 MED ORDER — PANTOPRAZOLE SODIUM 40 MG PO TBEC
40.0000 mg | DELAYED_RELEASE_TABLET | Freq: Every day | ORAL | Status: DC
  Administered 2020-02-06 – 2020-02-09 (×4): 40 mg via ORAL
  Filled 2020-02-06 (×5): qty 1

## 2020-02-06 MED ORDER — SODIUM CHLORIDE 0.9 % IV SOLN: 2.0000 g | INTRAVENOUS | Status: DC

## 2020-02-06 MED ORDER — DEXTROSE-NACL 5-0.45 % IV SOLN: INTRAVENOUS | Status: DC

## 2020-02-06 MED ORDER — IOHEXOL 350 MG/ML SOLN
60.0000 mL | Freq: Once | INTRAVENOUS | Status: AC | PRN
  Administered 2020-02-06: 60 mL via INTRAVENOUS

## 2020-02-06 MED ORDER — HYDROCODONE-ACETAMINOPHEN 5-325 MG PO TABS: 1.0000 | ORAL_TABLET | ORAL | Status: DC | PRN

## 2020-02-06 MED ORDER — AEROCHAMBER PLUS FLO-VU SMALL MISC
1.0000 | Freq: Once | Status: DC
  Filled 2020-02-06 (×2): qty 1

## 2020-02-06 MED ORDER — ENOXAPARIN SODIUM 40 MG/0.4ML ~~LOC~~ SOLN
40.0000 mg | SUBCUTANEOUS | Status: DC
  Administered 2020-02-06 – 2020-02-09 (×4): 40 mg via SUBCUTANEOUS
  Filled 2020-02-06 (×4): qty 0.4

## 2020-02-06 MED ORDER — NYSTATIN 100000 UNIT/ML MT SUSP: 5.0000 mL | Freq: Two times a day (BID) | OROMUCOSAL | Status: DC

## 2020-02-06 MED ORDER — ENSURE ENLIVE PO LIQD
237.0000 mL | Freq: Three times a day (TID) | ORAL | Status: DC
  Administered 2020-02-06 – 2020-02-08 (×8): 237 mL via ORAL

## 2020-02-06 MED ORDER — VANCOMYCIN HCL IN DEXTROSE 1-5 GM/200ML-% IV SOLN
1000.0000 mg | Freq: Once | INTRAVENOUS | Status: AC
  Administered 2020-02-06: 1000 mg via INTRAVENOUS
  Filled 2020-02-06: qty 200

## 2020-02-06 MED ORDER — AMLODIPINE BESYLATE 5 MG PO TABS
5.0000 mg | ORAL_TABLET | Freq: Every day | ORAL | Status: DC
  Administered 2020-02-06 – 2020-02-08 (×3): 5 mg
  Filled 2020-02-06 (×3): qty 1

## 2020-02-06 MED ORDER — ALBUTEROL SULFATE (2.5 MG/3ML) 0.083% IN NEBU
2.5000 mg | INHALATION_SOLUTION | Freq: Four times a day (QID) | RESPIRATORY_TRACT | Status: DC | PRN
  Administered 2020-02-08 – 2020-02-10 (×5): 2.5 mg via RESPIRATORY_TRACT
  Filled 2020-02-06 (×6): qty 3

## 2020-02-06 MED ORDER — SODIUM CHLORIDE 0.9 % IV SOLN
2.0000 g | Freq: Once | INTRAVENOUS | Status: AC
  Administered 2020-02-06: 2 g via INTRAVENOUS
  Filled 2020-02-06: qty 2

## 2020-02-06 MED ORDER — BIOTENE DRY MOUTH MT LIQD: Freq: Four times a day (QID) | OROMUCOSAL | Status: DC

## 2020-02-06 MED ORDER — LACTATED RINGERS IV BOLUS (SEPSIS)
500.0000 mL | Freq: Once | INTRAVENOUS | Status: AC
  Administered 2020-02-06: 500 mL via INTRAVENOUS

## 2020-02-06 MED ORDER — LORATADINE 10 MG PO TABS: 10.0000 mg | ORAL_TABLET | Freq: Every day | ORAL | Status: DC

## 2020-02-06 MED ORDER — PREGABALIN 50 MG PO CAPS: 50.0000 mg | ORAL_CAPSULE | Freq: Three times a day (TID) | ORAL | Status: DC

## 2020-02-06 MED ORDER — MECLIZINE HCL 12.5 MG PO TABS
12.5000 mg | ORAL_TABLET | Freq: Three times a day (TID) | ORAL | Status: DC | PRN
  Filled 2020-02-06: qty 1

## 2020-02-06 MED ORDER — SODIUM CHLORIDE 0.9 % IV SOLN
500.0000 mg | INTRAVENOUS | Status: DC
  Administered 2020-02-07: 500 mg via INTRAVENOUS
  Filled 2020-02-06 (×2): qty 500

## 2020-02-06 MED ORDER — SODIUM CHLORIDE 0.9 % IV SOLN
1.0000 g | Freq: Three times a day (TID) | INTRAVENOUS | Status: DC
  Administered 2020-02-06 – 2020-02-07 (×2): 1 g via INTRAVENOUS
  Filled 2020-02-06 (×5): qty 1

## 2020-02-06 MED ORDER — ACETAMINOPHEN-CODEINE 120-12 MG/5ML PO SOLN
12.5000 mL | Freq: Four times a day (QID) | ORAL | Status: DC | PRN
  Administered 2020-02-07 – 2020-02-09 (×4): 12.5 mL
  Filled 2020-02-06 (×9): qty 12.5

## 2020-02-06 MED ORDER — GABAPENTIN 100 MG PO CAPS
100.0000 mg | ORAL_CAPSULE | Freq: Every day | ORAL | Status: DC
  Filled 2020-02-06: qty 3

## 2020-02-06 MED ORDER — LACTATED RINGERS IV BOLUS (SEPSIS)
1000.0000 mL | Freq: Once | INTRAVENOUS | Status: AC
  Administered 2020-02-06: 1000 mL via INTRAVENOUS

## 2020-02-06 NOTE — H&P (Signed)
History and Physical    [name]:[id number] DOB: Apr 04, 1946 DOA: 02/06/2020  PCP: Barbaraann Boys, MD (Confirm with patient/family/NH records and if not entered, this has to be entered at [facility] point of entry) Patient coming from: Urgent Care  I have personally briefly reviewed patient's old medical records in [place]  Chief Complaint: hypoxemia, pneumonia  HPI: Crystal Kent is a 74 y.o. 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 She has been followed as an outpatient for multi-focal pneumonia with CT chest 01/05/20 revealing wide-sp0read ground glass opacity, tree-in-bud centric lobular, mild cylindrical bronchectiasis that radiology v=favored for recurrent aspiration. She has had outpatient antibiotics - Augmentin. For increased SOB and chest pain she presented to Urgent Care on the day of admission where she was found to have pneumonia with hypoxemia to O2 sat of 77% on RA. She was referred to [facility] -ED for further evaluation.  ED Course: Afebrile, BP 162/ 93, O2 sat 77% improving to 96% with oxygen support. Lab revealed mild leukocytosis at 11.9 with left shift, lactic acid 1.9, Procalcitonin 0.87, Covid negative. CTA chest negative for PE, revealed multifocal grand glass opacity, tree-in-bud findings. Code sepsis called by ED-MD: patient received 1500 cc LR bolus, was started on cefipime and Vancomycin. TRH called to admit patient for continued treatment.  Review of Systems: As per HPI otherwise 10 point review of systems negative.    Past Medical History:  Diagnosis Date  . Acid reflux   . Allergy   . Cancer ([place]) 1998   mouth ca-chemo/rad  . Difficult intubation    Pt reports "small airway"  . Feeding by G-tube ([place])    Nothing by mouth  . Hypertension   . Personal history of chemotherapy 1999  . Personal history of radiation therapy   . Thyroid disease   . Uses  prosthesis    plate to cover hole in roof of mouth s/p cancer surgery, doesn't wear anymore  . Vertigo    no episodes for several years    Past Surgical History:  Procedure Laterality Date  . ABDOMINAL HYSTERECTOMY    . BREAST BIOPSY Left    bx/clip-neg  . hemimandibulectomy Right 1998  . MYRINGOTOMY WITH TUBE PLACEMENT Right 02/27/2017   Procedure: MYRINGOTOMY WITH TUBE PLACEMENT Right ear.;  Surgeon: Margaretha Sheffield, MD;  Location: [place];  Service: ENT;  Laterality: Right;  . MYRINGOTOMY WITH TUBE PLACEMENT Right 12/09/2019   Procedure: EXAM UNDER ANESTHESIA REMOVAL OF CERUMEN;  Surgeon: Margaretha Sheffield, MD;  Location: [place];  Service: ENT;  Laterality: Right;  . partial maxillectomy Right   . PEG PLACEMENT N/A 05/27/2017   Procedure: PERCUTANEOUS ENDOSCOPIC GASTROSTOMY (PEG) PLACEMENT;  Surgeon: Lucilla Lame, MD;  Location: [facility] END[facility];  Service: Endoscopy;  Laterality: N/A;  . PEG PLACEMENT N/A 09/07/2018   Procedure: PERCUTANEOUS ENDOSCOPIC GASTROSTOMY (PEG) PLACEMENT;  Surgeon: Lucilla Lame, MD;  Location: [place];  Service: Endoscopy;  Laterality: N/A;  . RADICAL NECK DISSECTION Right 1998   multiple oral cancers   Soc Hx -  Married 12 years. Had 3 sons, two died by drug overdose at the same time - cocaine laced with fentanyl. They have 3 grandchildren and 8 great-grandchildren. They live independently   reports that she has never smoked. She has never used smokeless tobacco. She reports that she does not drink alcohol and does not use drugs.  Allergies  Allergen Reactions  .  Clavulanic Acid Diarrhea    Family History  Problem Relation Age of Onset  . Alzheimer's disease Mother   . Heart attack Father   . Healthy Son   . Healthy Son   . Breast cancer Neg Hx      Prior to Admission medications   Medication Sig Start Date End Date Taking? Authorizing Provider  acetaminophen (TYLENOL) 500 MG tablet Take 1,000 mg by mouth every 6 (six)  hours as needed for pain.    [provider]  acetaminophen-codeine (TYLENOL #3) 300-30 MG tablet Take by mouth every 4 (four) hours as needed for moderate pain (Takes BID).    [provider]  acetaminophen-codeine 120-12 MG/5ML solution Take 5 mLs by mouth every 6 (six) hours. 02/01/20   [provider]  Acetaminophen-Codeine 300-15 MG TABS Take 1 tablet by mouth every 4 (four) hours as needed. 02/02/20   [provider]  Acetylcysteine 600 MG CAPS Take 1,200 mg by mouth daily at 6 (six) AM.    [provider]  albuterol (PROVENTIL HFA;VENTOLIN HFA) 108 (90 Base) MCG/ACT inhaler Inhale 2 puffs into the lungs every 6 (six) hours as needed for wheezing or shortness of breath. 07/28/18   Glean Hess, MD  Artificial Saliva (BIOTENE ORALBALANCE DRY MOUTH) GEL by Transmucosal route 4 (four) times daily.  11/30/18   [provider]  fexofenadine (ALLEGRA) 180 MG tablet Take 180 mg by mouth daily as needed for allergies or rhinitis.    [provider]  gabapentin (NEURONTIN) 100 MG capsule Take 100-300 mg by mouth at bedtime. 11/05/19   [provider]  HYDROcodone-acetaminophen (NORCO/VICODIN) 5-325 MG tablet Take 1 tablet by mouth every 8 (eight) hours as needed. 01/29/20   [provider]  meclizine (ANTIVERT) 12.5 MG tablet Place 1 tablet (12.5 mg total) into feeding tube 3 (three) times daily as needed for dizziness. 10/09/18   Glean Hess, MD  nystatin (MYCOSTATIN) 100000 UNIT/ML suspension Take 5 mLs by mouth 2 (two) times daily. 09/20/19   [provider]  omeprazole (PRILOSEC) 20 MG capsule TAKE 1 CAPSULE EVERY DAY Patient taking differently: Take 20 mg by mouth daily.  09/09/19   Glean Hess, MD  pregabalin (LYRICA) 50 MG capsule Take 50 mg by mouth 3 (three) times daily. 01/04/20   [provider]  Calcium Carbonate-Vit D-Min (CALCIUM 1200 PO) Take by mouth daily.  08/23/19  [provider]    Physical Exam: Vitals:   02/06/20 1700 02/06/20 1800 02/06/20 1840 02/06/20 1930  BP: (!) 162/101 (!) 181/103 (!) 175/97 138/88  Pulse: (!) 110 (!) 125 (!) 129 (!) 111  Resp: (!) 26 (!) 23 (!) 22 (!) 25  Temp:      TempSrc:      SpO2: 93% 92% 90% 96%  Weight:      Height:        Constitutional: NAD, calm, comfortable Vitals:   02/06/20 1700 02/06/20 1800 02/06/20 1840 02/06/20 1930  BP: (!) 162/101 (!) 181/103 (!) 175/97 138/88  Pulse: (!) 110 (!) 125 (!) 129 (!) 111  Resp: (!) 26 (!) 23 (!) 22 (!) 25  Temp:      TempSrc:      SpO2: 93% 92% 90% 96%  Weight:      Height:       General:  WNWD woman in no distress Eyes: PERRL, lids and conjunctivae normal ENMT: Major head and neck surgical changes right. Neck: post-surgical changes right neck Respiratory:  No increased WOB. Very loud wet bronchial breath sounds obscurring other findings. Normal I/E ratio.  Cardiovascular: Regular rate and rhythm, no murmurs / rubs / gallops. No extremity edema. 2+ pedal pulses. No carotid bruits.  Abdomen: no tenderness, PEG stub in place without extension tubing, no masses palpated.  Bowel sounds positive.  Musculoskeletal: no clubbing / cyanosis. No joint deformity upper and lower extremities. Good ROM, no contractures. Normal muscle tone.  Skin: no rashes, lesions, ulcers. No induration Neurologic: CN 2-12 grossly intact. Sensation intact, DTR normal. Strength 5/5 in all 4.  Psychiatric: Normal judgment and insight. Alert and oriented x 3. Normal mood.     Labs on Admission: I have personally reviewed following labs and imaging studies  CBC: Recent Labs  Lab 02/06/20 1636  WBC 11.9*  NEUTROABS 10.3*  HGB 10.2*  HCT 31.5*  MCV 95.5  PLT 470   Basic Metabolic Panel: Recent Labs  Lab 02/06/20 1726  NA 130*  K 3.8  CL 95*  CO2 25  GLUCOSE 110*  BUN 20  CREATININE 0.82  CALCIUM 8.9   GFR: Estimated Creatinine Clearance: 43 mL/min (by C-G formula based  on SCr of 0.82 mg/dL). Liver Function Tests: Recent Labs  Lab 02/06/20 1726  AST 30  ALT 23  ALKPHOS 110  BILITOT 1.2  PROT 7.4  ALBUMIN 3.5   Recent Labs  Lab 02/06/20 1726  LIPASE 22   No results for input(s): AMMONIA in the last 168 hours. Coagulation Profile: Recent Labs  Lab 02/06/20 1636  INR 1.1   Cardiac Enzymes: No results for input(s): CKTOTAL, CKMB, CKMBINDEX, TROPONINI in the last 168 hours. BNP (last 3 results) No results for input(s): PROBNP in the last 8760 hours. HbA1C: No results for input(s): HGBA1C in the last 72 hours. CBG: No results for input(s): GLUCAP in the last 168 hours. Lipid Profile: No results for input(s): CHOL, HDL, LDLCALC, TRIG, CHOLHDL, LDLDIRECT in the last 72 hours. Thyroid Function Tests: No results for input(s): TSH, T4TOTAL, FREET4, T3FREE, THYROIDAB in the last 72 hours. Anemia Panel: No results for input(s): VITAMINB12, FOLATE, FERRITIN, TIBC, IRON, RETICCTPCT in the last 72 hours. Urine analysis:    Component Value Date/Time   COLORURINE STRAW (A) 02/06/2020 1636   APPEARANCEUR CLEAR (A) 02/06/2020 1636   LABSPEC 1.016 02/06/2020 1636   PHURINE 7.0 02/06/2020 1636   GLUCOSEU 50 (A) 02/06/2020 1636   HGBUR NEGATIVE 02/06/2020 1636   BILIRUBINUR NEGATIVE 02/06/2020 1636   KETONESUR NEGATIVE 02/06/2020 1636   PROTEINUR NEGATIVE 02/06/2020 1636   NITRITE NEGATIVE 02/06/2020 1636   LEUKOCYTESUR NEGATIVE 02/06/2020 1636    Radiological Exams on Admission: CT Angio Chest PE W and/or Wo Contrast  Result Date: 02/06/2020 CLINICAL DATA:  Shortness of breath and left chest pain EXAM: CT ANGIOGRAPHY CHEST WITH CONTRAST TECHNIQUE: Multidetector CT imaging of the chest was performed using the standard protocol during bolus administration of intravenous contrast. Multiplanar CT image reconstructions and MIPs were obtained to evaluate the vascular anatomy. CONTRAST:  35mL OMNIPAQUE IOHEXOL 350 MG/ML SOLN COMPARISON:  January 05, 2020  FINDINGS: Cardiovascular: There is a optimal opacification of the pulmonary arteries. There is no central,segmental, or subsegmental filling defects within the pulmonary arteries. There is mild cardiomegaly. No pericardial effusion or thickening. No evidence right heart strain. There is normal three-vessel brachiocephalic anatomy without proximal stenosis. Scattered aortic calcifications and coronary artery calcifications are present. Mediastinum/Nodes: Pretracheal and subcarinal adenopathy is present within the subcarinal region the largest measuring 1.5 cm in short  axis diameter, likely reactive. Thyroid gland, trachea, and esophagus demonstrate no significant findings. Lungs/Pleura: Interval progression extensive multifocal patchy airspace consolidation seen throughout both lungs. There scattered areas of bronchiectasis and tree-in-bud opacities. There is also reticulonodular opacity seen predominantly at both lung bases. There is a trace right pleural effusion present. Upper Abdomen: No acute abnormalities present in the visualized portions of the upper abdomen. Musculoskeletal: No chest wall abnormality. No acute or significant osseous findings. Review of the MIP images confirms the above findings. IMPRESSION: 1.  No central, segmental, or subsegmental pulmonary embolism. 2. Interval progression in extensive multifocal airspace opacities and consolidation, consistent with multifocal pneumonia. 3. Trace right pleural effusion. 4.  Aortic Atherosclerosis (ICD10-I70.0). Electronically Signed   By: Prudencio Pair M.D.   On: 02/06/2020 18:46   DG Chest Port 1 View  Result Date: 02/06/2020 CLINICAL DATA:  Shortness of breath, treated for chronic pneumonia EXAM: PORTABLE CHEST 1 VIEW COMPARISON:  CT 01/05/2020, radiograph 12/18/2019 FINDINGS: Increasingly coalescent airspace opacities throughout both lungs right slightly greater than left with some associated interstitial opacities as well. Findings are likely to  reflect a progression of the process seen on comparison CT 01/05/2020. No pneumothorax or visible effusion. Stable cardiomediastinal contours. No acute osseous or soft tissue abnormality. Degenerative changes are present in the imaged spine and shoulders. Telemetry leads overlie the chest. IMPRESSION: Increasingly coalescent airspace opacities throughout both lungs right slightly greater than left, may reflect progression of a multifocal infectious/inflammatory process seen on CT 01/05/2020. Electronically Signed   By: Lovena Le M.D.   On: 02/06/2020 17:24    EKG: Independently reviewed. Sinus tachycardia w/o acute changes  Assessment/Plan Active Problems:   Aspiration pneumonia (HCC)   Chronic pain after cancer treatment   Dysphagia, oropharyngeal   Essential hypertension   Hypothyroidism    1. Aspiration pneumonia - a chronic problem. She is followed by pulmonary at [facility] clinic. Sputum culture January 05, 2020 grew abundant Klebsiella that was pan sensitive except ampicillin. Suspect chronic aspiration. She does use suction at home for secretions.  Plan  Med-surg admit  Continue Cefipime - can convert to oral meds tomorrow if stable, e.g. ceftin.  Will add azithromycin to broaden coverage.  Continue Oxygen as needed to keep sats >90%  Continue albuterol MDI w/ aerochamber 2 puffs q 4 prn  [facility] pulmonary consult in AM  ID consult in AM - management of chronic aspiration pneumonia  PRN oral suction - patient controlled.   2. 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 She was doing well with Tylenol #3 q6 prn but Rx was not renewed. She was prescribed gabepentin then lyrica but she was intolerant after > week trial. Reviewed current regs and procedures re: pain mgt suggesting that she would be a candidate for long acting products but that this would be deferred to her pain clinic - next appt in September! Plan Tylenol #3 q 6prn per PEG  3. HTN  - continue home medication - amlodipine suspension 5 mg per peg daily  4. Code status - discussed with patient and her husband: DNR/DNI  5. Summar/disposition - patient with aspiration pneumonia with hypoxemia admitted for IV abx and oxygen support. Is being followed by [facility] pulmonary. ID consult recommended. HOme when medically stable.  DVT prophylaxis: lovenox  Code Status: DNR/DNI Family Communication: husband present during interview and exam. Agrees to Tx plan  Disposition Plan: home when stable  Consults called: please notify [facility] pulmonary in  AM; please call for ID consult in AM  Admission status: inpatient   Adella Hare MD [facility] Pager [phone]  If 7PM-7AM, please contact night-coverage [email or web address] Password [facility]  02/06/2020, 8:12 PM

## 2020-02-06 NOTE — Progress Notes (Signed)
PHARMACY -  BRIEF ANTIBIOTIC NOTE   Pharmacy has received consult(s) for Vancomycin and Cefepime from an ED provider.  The patient's profile has been reviewed for ht/wt/allergies/indication/available labs.    One time order(s) placed for Vancomycin and Cefepime by ED provider  Further antibiotics/pharmacy consults should be ordered by admitting physician if indicated.                       Thank you, Vira Blanco 02/06/2020  4:47 PM

## 2020-02-06 NOTE — ED Notes (Signed)
Admitting MD at bedside.

## 2020-02-06 NOTE — ED Provider Notes (Signed)
[facility] Emergency Department Provider Note  ____________________________________________  Time seen: Approximately 5:46 PM  I have reviewed the triage vital signs and the nursing notes.   HISTORY  Chief Complaint Shortness of Breath    HPI ORRIE LASCANO is a 74 y.o. 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 She had followed up at urgent care today who found her room air oxygen saturation to be 77% and sent her to the ED via EMS.  Complains of sharp chest pain across the entire anterior chest, worse with breathing. Feels short of breath. Has a nonproductive cough as well. Pain is severe, no alleviating factors.  Nonradiating.  Onset 4 days ago, constant, worsening.     Past Medical History:  Diagnosis Date  . Acid reflux   . Allergy   . Cancer ([place]) 1998   mouth ca-chemo/rad  . Difficult intubation    Pt reports "small airway"  . Feeding by G-tube ([place])    Nothing by mouth  . Hypertension   . Personal history of chemotherapy 1999  . Personal history of radiation therapy   . Thyroid disease   . Uses prosthesis    plate to cover hole in roof of mouth s/p cancer surgery, doesn't wear anymore  . Vertigo    no episodes for several years     Patient Active Problem List   Diagnosis Date Noted  . Mechanical complication of gastrostomy ([place])   . Oral candida 03/16/2018  . Tinea corporis 12/29/2017  . Insomnia 04/23/2017  . 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 Essential hypertension 06/28/2016  . Allergic rhinitis 06/28/2016  . Current use of proton pump inhibitor 06/28/2016  . Gastrostomy in place [facility]) 05/02/2016  . Chronic pansinusitis 02/13/2016  . Eustachian tube dysfunction 12/14/2014  . Hearing loss 12/14/2014  . Lesion of buccal mucosa 12/14/2014  . Dysphagia, oropharyngeal 05/23/2014  . Hypothyroidism  04/27/2014  . 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 ABDOMINAL HYSTERECTOMY    . BREAST BIOPSY Left    bx/clip-neg  . hemimandibulectomy Right 1998  . MYRINGOTOMY WITH TUBE PLACEMENT Right 02/27/2017   Procedure: MYRINGOTOMY WITH TUBE PLACEMENT Right ear.;  Surgeon: Margaretha Sheffield, MD;  Location: [place];  Service: ENT;  Laterality: Right;  . MYRINGOTOMY WITH TUBE PLACEMENT Right 12/09/2019   Procedure: EXAM UNDER ANESTHESIA REMOVAL OF CERUMEN;  Surgeon: Margaretha Sheffield, MD;  Location: [place];  Service: ENT;  Laterality: Right;  . partial maxillectomy Right   . PEG PLACEMENT N/A 05/27/2017   Procedure: PERCUTANEOUS ENDOSCOPIC GASTROSTOMY (PEG) PLACEMENT;  Surgeon: Lucilla Lame, MD;  Location: [facility] ENDOSCOPY;  Service: Endoscopy;  Laterality: N/A;  . PEG PLACEMENT N/A 09/07/2018   Procedure: PERCUTANEOUS ENDOSCOPIC GASTROSTOMY (PEG) PLACEMENT;  Surgeon: Lucilla Lame, MD;  Location: [place];  Service: Endoscopy;  Laterality: N/A;  . RADICAL NECK DISSECTION Right 1998   multiple oral cancers     Prior to Admission medications   Medication Sig Start Date End Date Taking? Authorizing Provider  acetaminophen (TYLENOL) 325 MG tablet Take 650 mg by mouth every 6 (six) hours as needed.    [provider]  acetaminophen-codeine (TYLENOL #3) 300-30 MG tablet Take by mouth every 4 (  four) hours as needed for moderate pain (Takes BID).    [provider]  albuterol (PROVENTIL HFA;VENTOLIN HFA) 108 (90 Base) MCG/ACT inhaler Inhale 2 puffs into the lungs every 6 (six) hours as needed for wheezing or shortness of breath. 07/28/18   Glean Hess, MD  amLODipine (NORVASC) 5 MG tablet Take 1 tablet (5 mg total) by mouth daily. 08/04/18   Glean Hess, MD  Artificial Saliva (BIOTENE  ORALBALANCE DRY MOUTH) GEL by Transmucosal route 4 (four) times daily.  11/30/18   [provider]  celecoxib (CELEBREX) 100 MG capsule Take 100 mg by mouth 2 (two) times daily.    [provider]  chlorpheniramine-HYDROcodone (TUSSIONEX PENNKINETIC ER) 10-8 MG/5ML SUER Take 5 mLs by mouth every 12 (twelve) hours as needed for cough. Will cause drowsiness; NO DRIVING. 4/76/54   Karen Kitchens, NP  fexofenadine (ALLEGRA) 180 MG tablet Take 180 mg by mouth daily as needed for allergies or rhinitis.    [provider]  fluticasone (FLONASE) 50 MCG/ACT nasal spray Place 2 sprays into both nostrils daily as needed. 04/27/13   [provider]  levofloxacin (LEVAQUIN) 500 MG tablet Take 1 tablet (500 mg total) by mouth daily. 12/18/19   Karen Kitchens, NP  levothyroxine (SYNTHROID, LEVOTHROID) 75 MCG tablet Take 1 tablet (75 mcg total) by mouth daily. 08/04/18   Glean Hess, MD  meclizine (ANTIVERT) 12.5 MG tablet Place 1 tablet (12.5 mg total) into feeding tube 3 (three) times daily as needed for dizziness. 10/09/18   Glean Hess, MD  omeprazole (PRILOSEC) 20 MG capsule TAKE 1 CAPSULE EVERY DAY 09/09/19   Glean Hess, MD  predniSONE (STERAPRED UNI-PAK 21 TAB) 10 MG (21) TBPK tablet Take by mouth daily. 60 mg x 1 day, 50 mg x 1 day, 40 mg x 1 day, 30 mg x 1 day, 20 mg x 1 day, 10 mg x 1 day 12/18/19   Karen Kitchens, NP  traMADol (ULTRAM) 50 MG tablet Take by mouth every 6 (six) hours as needed.    [provider]  Calcium Carbonate-Vit D-Min (CALCIUM 1200 PO) Take by mouth daily.  08/23/19  [provider]     Allergies Clavulanic acid   Family History  Problem Relation Age of Onset  . Alzheimer's disease Mother   . Heart attack Father   . Healthy Son   . Healthy Son   . Breast cancer Neg Hx     Social History Social History   Tobacco Use  . Smoking status: Never Smoker  . Smokeless tobacco: Never Used  . Tobacco comment: Smoking  cessation materials not required  Vaping Use  . Vaping Use: Never used  Substance Use Topics  . Alcohol use: No  . Drug use: No    Review of Systems  Constitutional:   No fever or chills.  ENT:   No sore throat. No rhinorrhea. Cardiovascular:   Positive chest pain as above without syncope. Respiratory: Positive shortness of breath and cough. Gastrointestinal:   Negative for abdominal pain, vomiting and diarrhea.  Musculoskeletal:   Negative for focal pain or swelling All other systems reviewed and are negative except as documented above in ROS and HPI.  ____________________________________________   PHYSICAL EXAM:  VITAL SIGNS: ED Triage Vitals  Enc Vitals Group     BP 02/06/20 1632 (!) 162/92     Pulse Rate 02/06/20 1632 (!) 112     Resp 02/06/20 1632 (!) 24  Temp 02/06/20 1632 97.7 F (36.5 C)     Temp Source 02/06/20 1632 Oral     SpO2 02/06/20 1632 98 %     Weight 02/06/20 1633 101 lb 6.6 oz (46 kg)     Height 02/06/20 1633 5\' 4"  (1.626 m)     Head Circumference --      Peak Flow --      Pain Score 02/06/20 1633 0     Pain Loc --      Pain Edu? --      Excl. in [place]? --     Vital signs reviewed, nursing assessments reviewed.   Constitutional:   Alert and oriented.  Ill-appearing Eyes:   Conjunctivae are normal. EOMI. PERRL. ENT      Head:   Normocephalic and atraumatic.      Nose:   Normal      Mouth/Throat: Dry mucous membranes.  Postradiation changes of the right buccal mucosa      Neck:   No meningismus. Full ROM. Hematological/Lymphatic/Immunilogical:   No cervical lymphadenopathy. Cardiovascular:   RRR. Symmetric bilateral radial and DP pulses.  No murmurs. Cap refill less than 2 seconds. Respiratory:   Tachypnea.  Diffuse rhonchi.  Symmetric air movement in all lung fields. Gastrointestinal:   Soft and nontender. Non distended. There is no CVA tenderness.  No rebound, rigidity, or guarding.  Musculoskeletal:   Normal range of motion in all  extremities. No joint effusions.  No lower extremity tenderness.  No edema. Neurologic:   Normal speech and language.  Motor grossly intact. No acute focal neurologic deficits are appreciated.  Skin:    Skin is warm, dry and intact. No rash noted.  No petechiae, purpura, or bullae.  ____________________________________________    LABS (pertinent positives/negatives) (all labs ordered are listed, but only abnormal results are displayed) Labs Reviewed  CBC WITH DIFFERENTIAL/PLATELET - Abnormal; Notable for the following components:      Result Value   WBC 11.9 (*)    RBC 3.30 (*)    Hemoglobin 10.2 (*)    HCT 31.5 (*)    Neutro Abs 10.3 (*)    Abs Immature Granulocytes 0.09 (*)    All other components within normal limits  APTT - Abnormal; Notable for the following components:   aPTT 37 (*)    All other components within normal limits  COMPREHENSIVE METABOLIC PANEL - Abnormal; Notable for the following components:   Sodium 130 (*)    Chloride 95 (*)    Glucose, Bld 110 (*)    All other components within normal limits  CULTURE, BLOOD (ROUTINE X 2)  CULTURE, BLOOD (ROUTINE X 2)  URINE CULTURE  SARS CORONAVIRUS 2 BY RT PCR (HOSPITAL ORDER, [facility])  LACTIC ACID, PLASMA  PROTIME-INR  LIPASE, BLOOD  PROCALCITONIN  URINALYSIS, COMPLETE (UACMP) WITH MICROSCOPIC   ____________________________________________   EKG  Interpreted by me Sinus tachycardia rate 112.  Normal axis and intervals.  Normal QRS ST segments and T waves.  ____________________________________________    RADIOLOGY  CT Angio Chest PE W and/or Wo Contrast  Result Date: 02/06/2020 CLINICAL DATA:  Shortness of breath and left chest pain EXAM: CT ANGIOGRAPHY CHEST WITH CONTRAST TECHNIQUE: Multidetector CT imaging of the chest was performed using the standard protocol during bolus administration of intravenous contrast. Multiplanar CT image reconstructions and MIPs were obtained  to evaluate the vascular anatomy. CONTRAST:  57mL OMNIPAQUE IOHEXOL 350 MG/ML SOLN COMPARISON:  January 05, 2020 FINDINGS: Cardiovascular:  There is a optimal opacification of the pulmonary arteries. There is no central,segmental, or subsegmental filling defects within the pulmonary arteries. There is mild cardiomegaly. No pericardial effusion or thickening. No evidence right heart strain. There is normal three-vessel brachiocephalic anatomy without proximal stenosis. Scattered aortic calcifications and coronary artery calcifications are present. Mediastinum/Nodes: Pretracheal and subcarinal adenopathy is present within the subcarinal region the largest measuring 1.5 cm in short axis diameter, likely reactive. Thyroid gland, trachea, and esophagus demonstrate no significant findings. Lungs/Pleura: Interval progression extensive multifocal patchy airspace consolidation seen throughout both lungs. There scattered areas of bronchiectasis and tree-in-bud opacities. There is also reticulonodular opacity seen predominantly at both lung bases. There is a trace right pleural effusion present. Upper Abdomen: No acute abnormalities present in the visualized portions of the upper abdomen. Musculoskeletal: No chest wall abnormality. No acute or significant osseous findings. Review of the MIP images confirms the above findings. IMPRESSION: 1.  No central, segmental, or subsegmental pulmonary embolism. 2. Interval progression in extensive multifocal airspace opacities and consolidation, consistent with multifocal pneumonia. 3. Trace right pleural effusion. 4.  Aortic Atherosclerosis (ICD10-I70.0). Electronically Signed   By: Prudencio Pair M.D.   On: 02/06/2020 18:46   DG Chest Port 1 View  Result Date: 02/06/2020 CLINICAL DATA:  Shortness of breath, treated for chronic pneumonia EXAM: PORTABLE CHEST 1 VIEW COMPARISON:  CT 01/05/2020, radiograph 12/18/2019 FINDINGS: Increasingly coalescent airspace opacities throughout both lungs  right slightly greater than left with some associated interstitial opacities as well. Findings are likely to reflect a progression of the process seen on comparison CT 01/05/2020. No pneumothorax or visible effusion. Stable cardiomediastinal contours. No acute osseous or soft tissue abnormality. Degenerative changes are present in the imaged spine and shoulders. Telemetry leads overlie the chest. IMPRESSION: Increasingly coalescent airspace opacities throughout both lungs right slightly greater than left, may reflect progression of a multifocal infectious/inflammatory process seen on CT 01/05/2020. Electronically Signed   By: Lovena Le M.D.   On: 02/06/2020 17:24    ____________________________________________   PROCEDURES .Critical Care Performed by: Carrie Mew, MD Authorized by: Carrie Mew, MD   Critical care provider statement:    Critical care time (minutes):  35   Critical care time was exclusive of:  Separately billable procedures and treating other patients   Critical care was necessary to treat or prevent imminent or life-threatening deterioration of the following conditions:  Sepsis and respiratory failure   Critical care was time spent personally by me on the following activities:  Development of treatment plan with patient or surrogate, discussions with consultants, evaluation of patient's response to treatment, examination of patient, obtaining history from patient or surrogate, ordering and performing treatments and interventions, ordering and review of laboratory studies, ordering and review of radiographic studies, pulse oximetry, re-evaluation of patient's condition and review of old charts    ____________________________________________  DIFFERENTIAL DIAGNOSIS   multifocal pneumonia, sepsis, pulmonary believes him, pneumothorax  CLINICAL IMPRESSION / ASSESSMENT AND PLAN / ED COURSE  Medications ordered in the ED: Medications  lactated ringers bolus 1,000  mL (0 mLs Intravenous Stopped 02/06/20 1801)    And  lactated ringers bolus 500 mL (0 mLs Intravenous Stopped 02/06/20 1821)  vancomycin (VANCOCIN) IVPB 1000 mg/200 mL premix (0 mg Intravenous Stopped 02/06/20 1827)  ceFEPIme (MAXIPIME) 2 g in sodium chloride 0.9 % 100 mL IVPB (0 g Intravenous Stopped 02/06/20 1726)  iohexol (OMNIPAQUE) 350 MG/ML injection 60 mL (60 mLs Intravenous Contrast Given 02/06/20 1821)    Pertinent labs &  imaging results that were available during my care of the patient were reviewed by me and considered in my medical decision making (see chart for details).  CHRISANN MELARAGNO was evaluated in Emergency Department on 02/06/2020 for the symptoms described in the history of present illness. She was evaluated in the context of the global COVID-19 pandemic, which necessitated consideration that the patient might be at risk for infection with the SARS-CoV-2 virus that causes COVID-19. Institutional protocols and algorithms that pertain to the evaluation of patients at risk for COVID-19 are in a state of rapid change based on information released by regulatory bodies including the CDC and federal and state organizations. These policies and algorithms were followed during the patient's care in the ED.   patient presents with shortness of breath, acute hypoxic respiratory failure with room air sat of 77%. High suspicion for multifocal pneumonia and sepsis based on exam. Weight-based fluid bolus for resuscitation ordered, empiric antibiotics with cefepime and vancomycin. Patient will need hospitalization until further stabilized.  ----------------------------------------- 5:48 PM on 02/06/2020 -----------------------------------------  Lactate normal. Chest x-ray shows worsening multifocal opacities consistent with pneumonia. Currently more tachycardic at 128 beats a minute, will proceed with CT scan of the chest to rule out PE if creatinine is acceptable.  Clinical Course as of Feb 06 1851  Nancy Fetter Feb 06, 2020  1851 CT negative for PE, consistent with multifocal pneumonia.  Discussed with the hospitalist.   [PS]    Clinical Course User Index [PS] Carrie Mew, MD     ____________________________________________   FINAL CLINICAL IMPRESSION(S) / ED DIAGNOSES    Final diagnoses:  Sepsis with acute hypoxic respiratory failure without septic shock, due to unspecified organism [facility])  Multifocal pneumonia     ED Discharge Orders    None      Portions of this note were generated with dragon dictation software. Dictation errors may occur despite best attempts at proofreading.   Carrie Mew, MD 02/06/20 [phone]

## 2020-02-06 NOTE — ED Provider Notes (Addendum)
[facility] ____________________________________________  Time seen: Approximately 3:58 PM  I have reviewed the triage vital signs and the nursing notes.   HISTORY  Chief Complaint Respiratory Distress   HPI Crystal Kent is a 74 y.o. female presenting with husband bedside for evaluation of shortness of breath and left rib pain.  Reports onset of shortness of breath and rib pain 4 days ago with acute worsening today.  Has been having chills and felt hot.  Has had continued coughing.  Husband at bedside also historian.  Reports patient has been having recurrent pneumonia over the last few months.  COVID-19 vaccinated.  Also reports has had multiple recent COVID-19 testing that was negative.     Past Medical History:  Diagnosis Date  . Acid reflux   . Allergy   . 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 Difficult intubation    Pt reports "small airway"  . Feeding by G-tube ([place])    Nothing by mouth  . Hypertension   . Personal history of chemotherapy 1999  . Personal history of radiation therapy   . Thyroid disease   . Uses prosthesis    plate to cover hole in roof of mouth s/p cancer surgery, doesn't wear anymore  . Vertigo    no episodes for several years    Patient Active Problem List   Diagnosis Date Noted  . Mechanical complication of gastrostomy ([place])   . Oral candida 03/16/2018  . Tinea corporis 12/29/2017  . Insomnia 04/23/2017  . 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 Essential hypertension 06/28/2016  . Allergic rhinitis 06/28/2016  . Current use of proton pump inhibitor 06/28/2016  . Gastrostomy in place [facility]) 05/02/2016  . Chronic pansinusitis 02/13/2016  . Eustachian tube dysfunction 12/14/2014  . Hearing loss 12/14/2014  . Lesion of buccal mucosa 12/14/2014  . Dysphagia, oropharyngeal 05/23/2014  . Hypothyroidism 04/27/2014  . 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 ABDOMINAL HYSTERECTOMY    . BREAST BIOPSY Left    bx/clip-neg  . hemimandibulectomy Right 1998  . MYRINGOTOMY WITH TUBE PLACEMENT Right 02/27/2017   Procedure: MYRINGOTOMY WITH TUBE PLACEMENT Right ear.;  Surgeon: Margaretha Sheffield, MD;  Location: [facility];  Service: ENT;  Laterality: Right;  . MYRINGOTOMY WITH TUBE PLACEMENT Right 12/09/2019   Procedure: EXAM UNDER ANESTHESIA REMOVAL OF CERUMEN;  Surgeon: Margaretha Sheffield, MD;  Location: [facility];  Service: ENT;  Laterality: Right;  . partial maxillectomy Right   . PEG PLACEMENT N/A 05/27/2017   Procedure: PERCUTANEOUS ENDOSCOPIC GASTROSTOMY (PEG) PLACEMENT;  Surgeon: Lucilla Lame, MD;  Location: [facility];  Service: Endoscopy;  Laterality: N/A;  . PEG PLACEMENT N/A 09/07/2018   Procedure: PERCUTANEOUS ENDOSCOPIC GASTROSTOMY (PEG) PLACEMENT;  Surgeon: Lucilla Lame, MD;  Location: [facility];  Service: Endoscopy;  Laterality: N/A;  . RADICAL NECK DISSECTION Right 1998   multiple oral cancers     No current facility-administered medications for this encounter.  Current Outpatient Medications:  .  acetaminophen (TYLENOL) 325 MG tablet, Take 650 mg by mouth every 6 (six) hours as needed., Disp: , Rfl:  .  acetaminophen-codeine (TYLENOL #3) 300-30 MG tablet, Take by mouth every 4 (four) hours as needed for moderate pain (Takes BID)., Disp: , Rfl:  .  albuterol (PROVENTIL HFA;VENTOLIN HFA) 108 (90 Base) MCG/ACT inhaler, Inhale 2  puffs into the lungs every 6 (six) hours as needed for wheezing or shortness of breath., Disp: 1 Inhaler, Rfl: 0 .  amLODipine (NORVASC) 5 MG tablet, Take 1 tablet (5 mg total) by mouth daily., Disp: 90 tablet, Rfl: 3 .  Artificial Saliva (BIOTENE ORALBALANCE DRY MOUTH) GEL, by Transmucosal route 4 (four) times daily. , Disp: , Rfl:  .  celecoxib (CELEBREX) 100 MG capsule, Take 100  mg by mouth 2 (two) times daily., Disp: , Rfl:  .  chlorpheniramine-HYDROcodone (TUSSIONEX PENNKINETIC ER) 10-8 MG/5ML SUER, Take 5 mLs by mouth every 12 (twelve) hours as needed for cough. Will cause drowsiness; NO DRIVING., Disp: 70 mL, Rfl: 0 .  fexofenadine (ALLEGRA) 180 MG tablet, Take 180 mg by mouth daily as needed for allergies or rhinitis., Disp: , Rfl:  .  fluticasone (FLONASE) 50 MCG/ACT nasal spray, Place 2 sprays into both nostrils daily as needed., Disp: , Rfl:  .  levofloxacin (LEVAQUIN) 500 MG tablet, Take 1 tablet (500 mg total) by mouth daily., Disp: 7 tablet, Rfl: 0 .  levothyroxine (SYNTHROID, LEVOTHROID) 75 MCG tablet, Take 1 tablet (75 mcg total) by mouth daily., Disp: 90 tablet, Rfl: 3 .  meclizine (ANTIVERT) 12.5 MG tablet, Place 1 tablet (12.5 mg total) into feeding tube 3 (three) times daily as needed for dizziness., Disp: 30 tablet, Rfl: 0 .  omeprazole (PRILOSEC) 20 MG capsule, TAKE 1 CAPSULE EVERY DAY, Disp: 90 capsule, Rfl: 3 .  predniSONE (STERAPRED UNI-PAK 21 TAB) 10 MG (21) TBPK tablet, Take by mouth daily. 60 mg x 1 day, 50 mg x 1 day, 40 mg x 1 day, 30 mg x 1 day, 20 mg x 1 day, 10 mg x 1 day, Disp: 21 tablet, Rfl: 0 .  traMADol (ULTRAM) 50 MG tablet, Take by mouth every 6 (six) hours as needed., Disp: , Rfl:   Facility-Administered Medications Ordered in Other Encounters:  .  ceFEPIme (MAXIPIME) 2 g in sodium chloride 0.9 % 100 mL IVPB, 2 g, Intravenous, Once, Carrie Mew, MD .  lactated ringers bolus 1,000 mL, 1,000 mL, Intravenous, Once **AND** lactated ringers bolus 500 mL, 500 mL, Intravenous, Once, Carrie Mew, MD .  vancomycin (VANCOCIN) IVPB 1000 mg/200 mL premix, 1,000 mg, Intravenous, Once, Carrie Mew, MD  Allergies Clavulanic acid  Family History  Problem Relation Age of Onset  . Alzheimer's disease Mother   . Heart attack Father   . Healthy Son   . Healthy Son   . Breast cancer Neg Hx     Social History Social History    Tobacco Use  . Smoking status: Never Smoker  . Smokeless tobacco: Never Used  . Tobacco comment: Smoking cessation materials not required  Vaping Use  . Vaping Use: Never used  Substance Use Topics  . Alcohol use: No  . Drug use: No    Review of Systems Cardiovascular: Positive leftchest pain. Respiratory: Positive shortness of breath. Gastrointestinal: No abdominal pain.   ____________________________________________   PHYSICAL EXAM:  VITAL SIGNS: ED Triage Vitals  Enc Vitals Group     BP 02/06/20 1533 136/86     Pulse Rate 02/06/20 1533 (!) 118     Resp 02/06/20 1533 (!) 46     Temp 02/06/20 1533 99 F (37.2 C)     Temp Source 02/06/20 1533 Tympanic     SpO2 02/06/20 1533 (!) 77 %     Weight 02/06/20 1540 102 lb 15.3 oz (46.7 kg)     Height --  Head Circumference --      Peak Flow --      Pain Score 02/06/20 1539 7     Pain Loc --      Pain Edu? --      Excl. in [place]? --     Constitutional: Appears uncomfortable. Cardiovascular: Tachycardic. Respiratory: Diffuse rhonchi and diminished bases.  Gastrointestinal: Soft and nontender. No distention. Normal Bowel sounds. No CVA tenderness. Musculoskeletal: No distal edema noted.  Diffuse left lateral rib tenderness underneath the left breast. Skin:  Skin is warm, dry  Psychiatric: Mood and affect are normal.Patient exhibits appropriate insight and judgment   ___________________________________________   LABS (all labs ordered are listed, but only abnormal results are displayed)  Labs Reviewed - No data to display ____________________________________________  EKG ED ECG REPORT I, Marylene Land, the attending provider, personally viewed and interpreted this ECG.   Date: 02/06/2020  EKG Time: 1546  Rate: 107  Rhythm: Sinus tachycardia  ST&T Change: none   PROCEDURES Procedures    INITIAL IMPRESSION / ASSESSMENT AND PLAN / ED COURSE  Pertinent labs & imaging results that were available during  my care of the patient were reviewed by me and considered in my medical decision making (see chart for details).  Patient with recent recurrent pneumonia presenting with husband been at bedside for acute onset of shortness of breath and left rib pain.  Patient hypoxic upon arrival at 77% room air.  At rest was in mid 80s, and with 4 L O2 93%. Discussed multiple differentials with patient and husband, recommend EMS transfer to ER, patient verbalized understanding.  IV line established.  O2 transfer to [facility] by EMS.  Brandi RN charge called and given report.   ____________________________________________   FINAL CLINICAL IMPRESSION(S) / ED DIAGNOSES  Final diagnoses:  Hypoxic  Shortness of breath     ED Discharge Orders    None       Note: This dictation was prepared with Dragon dictation along with smaller phrase technology. Any transcriptional errors that result from this process are unintentional.         Marylene Land, NP 02/06/20 1705

## 2020-02-06 NOTE — ED Triage Notes (Signed)
Pt comes EMS from [facility] urgent care with SOB. Pt has been being treated for chronic pneumonia. Hx of throat/tongue/mouth cancer with reconstruction. EMS heard crackles in all fields. AOx4. 18g in left ac and 22 g in right ac. 93% on 6L [place].

## 2020-02-06 NOTE — Progress Notes (Signed)
CODE SEPSIS - PHARMACY COMMUNICATION  **Broad Spectrum Antibiotics should be administered within 1 hour of Sepsis diagnosis**  Time Code Sepsis Called/Page Received: 16:40  Antibiotics Ordered: Vancomycin and Cefepime  Time of 1st antibiotic administration: Cefepime given at 17:05  Additional action taken by pharmacy:    If necessary, Name of Provider/Nurse Contacted:      Vira Blanco ,PharmD Clinical Pharmacist  02/06/2020  5:53 PM

## 2020-02-06 NOTE — ED Triage Notes (Signed)
Patient with significant shortness of breath and audibly wheezing. Patient husband is speaking due to patient not being able to talk. Patient clenching chest in pain due to breathing difficulty.

## 2020-02-06 NOTE — ED Notes (Signed)
Patient is being discharged from the Urgent Care and sent to the Emergency Department via EMS. Per Marylene Land, NP, patient is in need of higher level of care due to Respiratory Distress. Patient is aware and verbalizes understanding of plan of care.  Vitals:   02/06/20 1533  BP: 136/86  Pulse: (!) 118  Resp: (!) 46  Temp: 99 F (37.2 C)  SpO2: (!) 77%

## 2020-02-07 ENCOUNTER — Inpatient Hospital Stay: Payer: Medicare HMO

## 2020-02-07 ENCOUNTER — Institutional Professional Consult (permissible substitution): Payer: Medicare HMO | Admitting: Pulmonary Disease

## 2020-02-07 DIAGNOSIS — J189 Pneumonia, unspecified organism: Secondary | ICD-10-CM

## 2020-02-07 DIAGNOSIS — R1312 Dysphagia, oropharyngeal phase: Secondary | ICD-10-CM

## 2020-02-07 LAB — TSH: TSH: 13.565 u[IU]/mL — ABNORMAL HIGH (ref 0.350–4.500)

## 2020-02-07 LAB — BASIC METABOLIC PANEL
Anion gap: 14 (ref 5–15)
BUN: 21 mg/dL (ref 8–23)
CO2: 28 mmol/L (ref 22–32)
Calcium: 9.3 mg/dL (ref 8.9–10.3)
Chloride: 91 mmol/L — ABNORMAL LOW (ref 98–111)
Creatinine, Ser: 0.73 mg/dL (ref 0.44–1.00)
GFR calc Af Amer: >60 mL/min (ref >60)
GFR calc non Af Amer: >60 mL/min (ref >60)
Glucose, Bld: 125 mg/dL — ABNORMAL HIGH (ref 70–99)
Potassium: 3.4 mmol/L — ABNORMAL LOW (ref 3.5–5.1)
Sodium: 133 mmol/L — ABNORMAL LOW (ref 135–145)

## 2020-02-07 LAB — RESPIRATORY PANEL BY PCR

## 2020-02-07 LAB — MAGNESIUM: Magnesium: 1.9 mg/dL (ref 1.7–2.4)

## 2020-02-07 LAB — GLUCOSE, CAPILLARY: Glucose-Capillary: 141 mg/dL — ABNORMAL HIGH (ref 70–99)

## 2020-02-07 LAB — MRSA PCR SCREENING: MRSA by PCR: POSITIVE — AB

## 2020-02-07 MED ORDER — LEVOFLOXACIN 500 MG PO TABS
500.0000 mg | ORAL_TABLET | Freq: Every day | ORAL | Status: DC
  Administered 2020-02-07 – 2020-02-08 (×2): 500 mg via ORAL
  Filled 2020-02-07 (×3): qty 1

## 2020-02-07 MED ORDER — KETOROLAC TROMETHAMINE 30 MG/ML IJ SOLN
15.0000 mg | Freq: Three times a day (TID) | INTRAMUSCULAR | Status: DC
  Administered 2020-02-07 – 2020-02-10 (×11): 15 mg via INTRAVENOUS
  Filled 2020-02-07 (×11): qty 1

## 2020-02-07 MED ORDER — LEVOTHYROXINE SODIUM 50 MCG PO TABS: 75.0000 ug | ORAL_TABLET | Freq: Every day | ORAL | Status: DC

## 2020-02-07 MED ORDER — ACETYLCYSTEINE 20 % IN SOLN
4.0000 mL | Freq: Two times a day (BID) | RESPIRATORY_TRACT | Status: DC
  Administered 2020-02-08 – 2020-02-10 (×5): 4 mL via RESPIRATORY_TRACT
  Filled 2020-02-07 (×7): qty 4

## 2020-02-07 MED ORDER — MELATONIN 5 MG PO TABS
5.0000 mg | ORAL_TABLET | Freq: Every day | ORAL | Status: DC
  Administered 2020-02-07 – 2020-02-09 (×3): 5 mg
  Filled 2020-02-07 (×5): qty 1

## 2020-02-07 MED ORDER — FUROSEMIDE 10 MG/ML IJ SOLN
20.0000 mg | Freq: Once | INTRAMUSCULAR | Status: AC
  Administered 2020-02-07: 05:00:00 20 mg via INTRAVENOUS
  Filled 2020-02-07: qty 2

## 2020-02-07 MED ORDER — METOPROLOL TARTRATE 5 MG/5ML IV SOLN
10.0000 mg | Freq: Once | INTRAVENOUS | Status: AC
  Administered 2020-02-07: 10 mg via INTRAVENOUS

## 2020-02-07 MED ORDER — AMOXICILLIN-POT CLAVULANATE 400-57 MG/5ML PO SUSR
875.0000 mg | Freq: Two times a day (BID) | ORAL | Status: DC
  Filled 2020-02-07 (×2): qty 10.9

## 2020-02-07 MED ORDER — AMOXICILLIN-POT CLAVULANATE 400-57 MG/5ML PO SUSR
875.0000 mg | Freq: Two times a day (BID) | ORAL | Status: DC
  Administered 2020-02-07 – 2020-02-10 (×7): 875 mg
  Filled 2020-02-07 (×2): qty 10.94
  Filled 2020-02-07: qty 10.9
  Filled 2020-02-07: qty 10.94
  Filled 2020-02-07: qty 10.9
  Filled 2020-02-07: qty 10.94
  Filled 2020-02-07: qty 10.9
  Filled 2020-02-07: qty 10.94

## 2020-02-07 MED ORDER — ZOLPIDEM TARTRATE 5 MG PO TABS: 5.0000 mg | ORAL_TABLET | Freq: Every evening | ORAL | Status: DC | PRN

## 2020-02-07 MED ORDER — METOPROLOL TARTRATE 5 MG/5ML IV SOLN
10.0000 mg | INTRAVENOUS | Status: AC
  Administered 2020-02-07: 10 mg via INTRAVENOUS
  Filled 2020-02-07: qty 10

## 2020-02-07 MED ORDER — POTASSIUM CHLORIDE 20 MEQ PO PACK
20.0000 meq | PACK | Freq: Three times a day (TID) | ORAL | Status: DC
  Administered 2020-02-08 – 2020-02-10 (×8): 20 meq
  Filled 2020-02-07 (×8): qty 1

## 2020-02-07 MED ORDER — METOPROLOL TARTRATE 5 MG/5ML IV SOLN
INTRAVENOUS | Status: AC
  Filled 2020-02-07: qty 10

## 2020-02-07 NOTE — Progress Notes (Signed)
Cross Cover Brief Note Nurse calls to report a fib with RVR - only mild improvement in rate after 10 of metroprolol IV previously given Patient with ST last night, 120's with shortness of breath and crackles, mildly improved after IV fluids stopped and one dose IV lasix given.    Second dose of 10 mg IV metoprolol converted patient to sinus rhythm with rate in the 90s. Daily regimen started, Atrial fib was very short duration, no anticoagulation warrantedl Chest x-ray  FINDINGS: Confluent bilateral airspace opacities slightly worsened in the right upper lobe compared to the prior radiograph. Small bilateral pleural effusions. No pneumothorax. The cardiac silhouette is within limits. No acute osseous pathology.  IMPRESSION: Multifocal pneumonia with slight interval worsening of the right upper lobe airspace opacity compared to the prior radiograph. Continued follow-up recommended.  Mag level normal Potassium level supplemented TSH above 13, free T4 is pending. 0 daily HRT increased to 88 mcg. - willl need follow up labs 6 weeks

## 2020-02-07 NOTE — Plan of Care (Signed)

## 2020-02-07 NOTE — Progress Notes (Signed)
Pt is scheduled for CPT Q4 with vest. Pt family (husband) stated pt would receive sleep aid tonight and would like to hold on CPT until morning.

## 2020-02-07 NOTE — Progress Notes (Addendum)
PROGRESS NOTE    Crystal Kent  KKX:[id number] DOB: 12/13/1945 DOA: 02/06/2020 PCP: Barbaraann Boys, MD   Brief Narrative:  Crystal Kent is a 74 y.o. 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 She has been followed as an outpatient for multi-focal pneumonia with CT chest 01/05/20 revealing wide-sp0read ground glass opacity, tree-in-bud centric lobular, mild cylindrical bronchectiasis that radiology v=favored for recurrent aspiration. She has had outpatient antibiotics - Augmentin. For increased SOB and chest pain she presented to Urgent Care on the day of admission where she was found to have pneumonia with hypoxemia to O2 sat of 77% on RA. She was referred to [facility] for further evaluation.  On arrival she was hypoxic requiring 4 L. Had procalcitonin positive at 0.87.  CTA negative for PE but revealed multifocal groundglass opacities, tree-in-bud findings.  Initially code sepsis was called and she was started on cefepime and vancomycin.  Sepsis ruled out.  Antibiotics changed to Augmentin per PEG tube.  Subjective: Patient speaks in whispers.  Denies any complaint.  She does drink a small cup of coffee daily.  Had a very weak cough.  Husband was at bedside. Denies any fever or chills.  She wants to see her pulmonologist.  Assessment & Plan:   Active Problems:   Dysphagia, oropharyngeal   Essential hypertension   Hypothyroidism   Aspiration pneumonia (HCC)   Chronic pain after cancer treatment  Chronic aspiration pneumonia.  Has chronic aspiration secondary to her surgeries for oropharyngeal cancer.  She followed up with pulmonary.  Recent sputum culture done earlier this month grew Klebsiella with good sensitivity except ampicillin.  She does do some suctioning at home. No home oxygen use.  Currently on 4 L. CT chest with similar findings.  She wants to see her pulmonologist.  Send a message to  Dr. Harold Barban will see the patient.  She was started on azithromycin and cefepime. Patient also has a very weak cough and unable to clear her secretions.  Looks like patient and her husband does not have whole insight of her disease. -Discontinue cefepime and azithromycin. -Start her on Augmentin through PEG tube. -Swallow evaluation further recommendations as patient wants to continue taking a cup of coffee daily at least. -Chest PT. -Might need oxygen for home-we will try weaning her off if unsuccessful then we will evaluate her for home oxygen needs. -Palliative care consult.  Chronic pain after cancer treatment.  Patient has tried Tylenol 3 with some relief.  Prescription was not renewed.  She was also given a trial of gabapentin teen and Lyrica and unable to tolerate it. -She was started on Tylenol 3 through PEG tube. -She needs to follow-up with her pain clinic for further recommendations.  Hypertension.  Blood pressure within goal. -Continue home amlodipine suspension.  Hypothyroidism. -Continue home dose of Synthroid.  Objective: Vitals:   02/07/20 0756 02/07/20 0853 02/07/20 0959 02/07/20 1354  BP: (!) 143/99 (!) 141/113 119/73 125/72  Pulse: (!) 115 (!) 109 (!) 108 (!) 107  Resp: 17  15 15   Temp: (!) 97.4 F (36.3 C) 98.1 F (36.7 C) 97.8 F (36.6 C) 97.8 F (36.6 C)  TempSrc: Oral Oral    SpO2: 96% 99% 96% 97%  Weight:      Height:       No intake or output data in the 24 hours ending 02/07/20 1411 Filed Weights   02/06/20 1633  Weight: 46  kg    Examination:  General exam:, Emaciated elderly lady, disproportionate lower face secondary to surgeries, appears calm and comfortable  Respiratory system: Scattered bilateral rhonchi, respiratory effort normal. Cardiovascular system: S1 & S2 heard, RRR. Gastrointestinal system: Soft, nontender, nondistended, bowel sounds positive.  PEG tube in place. Central nervous system: Alert and oriented. No focal  neurological deficits.Symmetric 5 x 5 power. Extremities: No edema, no cyanosis, pulses intact and symmetrical. Psychiatry: Judgement and insight appear normal. Mood & affect appropriate.    DVT prophylaxis: Lovenox Code Status: DNR Family Communication: Husband was updated at bedside. Disposition Plan:  Status is: Inpatient  Remains inpatient appropriate because:Inpatient level of care appropriate due to severity of illness   Dispo: The patient is from: Home              Anticipated d/c is to: Home              Anticipated d/c date is: 1 day              Patient currently is not medically stable to d/c.  Patient with oropharyngeal cancer s/p chemoradiation and surgery.  Chronic aspiration on her own secretions, very weak cough.  Is high risk for deterioration and death.  Palliative care was consulted.  Consultants:   Pulmonary  Palliative care  Procedures:  Antimicrobials:  Augmentin  Data Reviewed: I have personally reviewed following labs and imaging studies  CBC: Recent Labs  Lab 02/06/20 1636  WBC 11.9*  NEUTROABS 10.3*  HGB 10.2*  HCT 31.5*  MCV 95.5  PLT 409   Basic Metabolic Panel: Recent Labs  Lab 02/06/20 1726  NA 130*  K 3.8  CL 95*  CO2 25  GLUCOSE 110*  BUN 20  CREATININE 0.82  CALCIUM 8.9   GFR: Estimated Creatinine Clearance: 43 mL/min (by C-G formula based on SCr of 0.82 mg/dL). Liver Function Tests: Recent Labs  Lab 02/06/20 1726  AST 30  ALT 23  ALKPHOS 110  BILITOT 1.2  PROT 7.4  ALBUMIN 3.5   Recent Labs  Lab 02/06/20 1726  LIPASE 22   No results for input(s): AMMONIA in the last 168 hours. Coagulation Profile: Recent Labs  Lab 02/06/20 1636  INR 1.1   Cardiac Enzymes: No results for input(s): CKTOTAL, CKMB, CKMBINDEX, TROPONINI in the last 168 hours. BNP (last 3 results) No results for input(s): PROBNP in the last 8760 hours. HbA1C: No results for input(s): HGBA1C in the last 72 hours. CBG: No results for  input(s): GLUCAP in the last 168 hours. Lipid Profile: No results for input(s): CHOL, HDL, LDLCALC, TRIG, CHOLHDL, LDLDIRECT in the last 72 hours. Thyroid Function Tests: No results for input(s): TSH, T4TOTAL, FREET4, T3FREE, THYROIDAB in the last 72 hours. Anemia Panel: No results for input(s): VITAMINB12, FOLATE, FERRITIN, TIBC, IRON, RETICCTPCT in the last 72 hours. Sepsis Labs: Recent Labs  Lab 02/06/20 1636 02/06/20 1726  PROCALCITON  --  0.87  LATICACIDVEN 1.9  --     Recent Results (from the past 240 hour(s))  Blood Culture (routine x 2)     Status: None (Preliminary result)   Collection Time: 02/06/20  4:36 PM   Specimen: BLOOD  Result Value Ref Range Status   Specimen Description BLOOD RAC  Final   Special Requests   Final    BOTTLES DRAWN AEROBIC AND ANAEROBIC Blood Culture adequate volume   Culture   Final    NO GROWTH < 24 HOURS Performed at [facility], [address] [place], [place] [address]    Report Status PENDING  Incomplete  Blood Culture (routine x 2)     Status: None (Preliminary result)   Collection Time: 02/06/20  4:36 PM   Specimen: BLOOD  Result Value Ref Range Status   Specimen Description BLOOD LAC  Final   Special Requests   Final    BOTTLES DRAWN AEROBIC AND ANAEROBIC Blood Culture adequate volume   Culture   Final    NO GROWTH < 24 HOURS Performed at [facility], [address], [place], [place] [address]    Report Status PENDING  Incomplete  SARS Coronavirus 2 by RT PCR (hospital order, performed in [place] hospital lab) Nasopharyngeal Nasopharyngeal Swab     Status: None   Collection Time: 02/06/20  6:41 PM   Specimen: Nasopharyngeal Swab  Result Value Ref Range Status   SARS Coronavirus 2 NEGATIVE NEGATIVE Final    Comment: (NOTE) SARS-CoV-2 target nucleic acids are NOT DETECTED.  The SARS-CoV-2 RNA is generally detectable in upper and lower respiratory specimens during the acute phase of infection. The  lowest concentration of SARS-CoV-2 viral copies this assay can detect is 250 copies / mL. A negative result does not preclude SARS-CoV-2 infection and should not be used as the sole basis for treatment or other patient management decisions.  A negative result may occur with improper specimen collection / handling, submission of specimen other than nasopharyngeal swab, presence of viral mutation(s) within the areas targeted by this assay, and inadequate number of viral copies (<250 copies / mL). A negative result must be combined with clinical observations, patient history, and epidemiological information.  Fact Sheet for Patients:   StrictlyIdeas.no  Fact Sheet for Healthcare Providers: BankingDealers.co.za  This test is not yet approved or  cleared by the [place] FDA and has been authorized for detection and/or diagnosis of SARS-CoV-2 by FDA under an Emergency Use Authorization (EUA).  This EUA will remain in effect (meaning this test can be used) for the duration of the COVID-19 declaration under Section 564(b)(1) of the Act, 21 U.S.C. section 360bbb-3(b)(1), unless the authorization is terminated or revoked sooner.  Performed at [facility], [address] [place], [place] [address]      Radiology Studies: CT Angio Chest PE W and/or Wo Contrast  Result Date: 02/06/2020 CLINICAL DATA:  Shortness of breath and left chest pain EXAM: CT ANGIOGRAPHY CHEST WITH CONTRAST TECHNIQUE: Multidetector CT imaging of the chest was performed using the standard protocol during bolus administration of intravenous contrast. Multiplanar CT image reconstructions and MIPs were obtained to evaluate the vascular anatomy. CONTRAST:  31mL OMNIPAQUE IOHEXOL 350 MG/ML SOLN COMPARISON:  January 05, 2020 FINDINGS: Cardiovascular: There is a optimal opacification of the pulmonary arteries. There is no central,segmental, or subsegmental filling defects  within the pulmonary arteries. There is mild cardiomegaly. No pericardial effusion or thickening. No evidence right heart strain. There is normal three-vessel brachiocephalic anatomy without proximal stenosis. Scattered aortic calcifications and coronary artery calcifications are present. Mediastinum/Nodes: Pretracheal and subcarinal adenopathy is present within the subcarinal region the largest measuring 1.5 cm in short axis diameter, likely reactive. Thyroid gland, trachea, and esophagus demonstrate no significant findings. Lungs/Pleura: Interval progression extensive multifocal patchy airspace consolidation seen throughout both lungs. There scattered areas of bronchiectasis and tree-in-bud opacities. There is also reticulonodular opacity seen predominantly at both lung bases. There is a trace right pleural effusion present. Upper Abdomen: No acute abnormalities present in the visualized portions of the  upper abdomen. Musculoskeletal: No chest wall abnormality. No acute or significant osseous findings. Review of the MIP images confirms the above findings. IMPRESSION: 1.  No central, segmental, or subsegmental pulmonary embolism. 2. Interval progression in extensive multifocal airspace opacities and consolidation, consistent with multifocal pneumonia. 3. Trace right pleural effusion. 4.  Aortic Atherosclerosis (ICD10-I70.0). Electronically Signed   By: Prudencio Pair M.D.   On: 02/06/2020 18:46   DG Chest Port 1 View  Result Date: 02/06/2020 CLINICAL DATA:  Shortness of breath, treated for chronic pneumonia EXAM: PORTABLE CHEST 1 VIEW COMPARISON:  CT 01/05/2020, radiograph 12/18/2019 FINDINGS: Increasingly coalescent airspace opacities throughout both lungs right slightly greater than left with some associated interstitial opacities as well. Findings are likely to reflect a progression of the process seen on comparison CT 01/05/2020. No pneumothorax or visible effusion. Stable cardiomediastinal contours. No acute  osseous or soft tissue abnormality. Degenerative changes are present in the imaged spine and shoulders. Telemetry leads overlie the chest. IMPRESSION: Increasingly coalescent airspace opacities throughout both lungs right slightly greater than left, may reflect progression of a multifocal infectious/inflammatory process seen on CT 01/05/2020. Electronically Signed   By: Lovena Le M.D.   On: 02/06/2020 17:24    Scheduled Meds: . AeroChamber Plus Flo-Vu Small  1 each Other Once  . amLODipine  5 mg Per Tube Daily  . amoxicillin-clavulanate  875 mg Oral Q12H  . antiseptic oral rinse   Mouth Rinse QID  . enoxaparin (LOVENOX) injection  40 mg Subcutaneous Q24H  . feeding supplement (ENSURE ENLIVE)  237 mL Oral TID  . ketorolac  15 mg Intravenous Q8H  . melatonin  5 mg Per Tube QHS  . pantoprazole  40 mg Oral Daily   Continuous Infusions:   LOS: 1 day   Time spent: 45 minutes.  I personally reviewed her chart and images.  More than 50% of that time was spent in direct patient care and discussion with other providers.  Lorella Nimrod, MD [facility]  If 7PM-7AM, please contact night-coverage [email or web address]  02/07/2020, 2:11 PM   This record has been created using Systems analyst. Errors have been sought and corrected,but may not always be located. Such creation errors do not reflect on the standard of care.

## 2020-02-07 NOTE — Consult Note (Signed)
Pulmonary Medicine          Date: 02/07/2020,   MRN# [id number] Crystal Kent Mar 04, 1946     AdmissionWeight: 46 kg                 CurrentWeight: 46 kg   Referring physician: Dr Reesa Chew   CHIEF COMPLAINT:   Recurrent aspiration pneumonia   HISTORY OF PRESENT ILLNESS   74yo F with hx of malignancy of hard/soft palate s/p chemo/radiation and recurrent aspiration pneumonia. She was seen by me in clinic at [facility]. She came in due to acute hypoxemia with respiratory distress and found to have pneumonia. She was found tohave desaturation <80%. She met criteria for sepsis and was treated with IVF and empiric antibiotics. She had PEG tube and husband states there is still some "milkshake coming up to throat regurgitating".  She has been on augmentin from respiratory specimen obtained on outpatient basis.  She has bilateral infiltrates with wheezing and rhonchorous breath sounds. 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 She has recurrent pneumonia yearly but this time its been very difficult to clear.    PAST MEDICAL HISTORY   Past Medical History:  Diagnosis Date  . Acid reflux   . Allergy   . 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 Difficult intubation    Pt reports "small airway"  . Feeding by G-tube ([place])    Nothing by mouth  . Hypertension   . Personal history of chemotherapy 1999  . Personal history of radiation therapy   . Thyroid disease   . Uses prosthesis    plate to cover hole in roof of mouth s/p cancer surgery, doesn't wear anymore  . Vertigo    no episodes for several years     SURGICAL HISTORY   Past Surgical History:  Procedure Laterality Date  . ABDOMINAL HYSTERECTOMY    . BREAST BIOPSY Left    bx/clip-neg  . hemimandibulectomy Right 1998  . MYRINGOTOMY WITH TUBE PLACEMENT Right 02/27/2017   Procedure: MYRINGOTOMY WITH TUBE PLACEMENT Right ear.;  Surgeon: Margaretha Sheffield, MD;  Location: [facility];  Service:  ENT;  Laterality: Right;  . MYRINGOTOMY WITH TUBE PLACEMENT Right 12/09/2019   Procedure: EXAM UNDER ANESTHESIA REMOVAL OF CERUMEN;  Surgeon: Margaretha Sheffield, MD;  Location: [place];  Service: ENT;  Laterality: Right;  . partial maxillectomy Right   . PEG PLACEMENT N/A 05/27/2017   Procedure: PERCUTANEOUS ENDOSCOPIC GASTROSTOMY (PEG) PLACEMENT;  Surgeon: Lucilla Lame, MD;  Location: [facility] ENDOSCOPY;  Service: Endoscopy;  Laterality: N/A;  . PEG PLACEMENT N/A 09/07/2018   Procedure: PERCUTANEOUS ENDOSCOPIC GASTROSTOMY (PEG) PLACEMENT;  Surgeon: Lucilla Lame, MD;  Location: [facility];  Service: Endoscopy;  Laterality: N/A;  . RADICAL NECK DISSECTION Right 1998   multiple oral cancers     FAMILY HISTORY   Family History  Problem Relation Age of Onset  . Alzheimer's disease Mother   . Heart attack Father   . Healthy Son   . Healthy Son   . Breast cancer Neg Hx      SOCIAL HISTORY   Social History   Tobacco Use  . Smoking status: Never Smoker  . Smokeless tobacco: Never Used  . Tobacco comment: Smoking cessation materials not required  Vaping Use  . Vaping Use: Never used  Substance Use Topics  . Alcohol use: No  . Drug use: No     MEDICATIONS    Home Medication:  Current Medication:  Current Facility-Administered Medications:  .  acetaminophen-codeine 120-12 MG/5ML solution 12.5 mL, 12.5 mL, Per Tube, Q6H PRN, Norins, Heinz Knuckles, MD, 12.5 mL at 02/07/20 0850 .  AeroChamber Plus Flo-Vu Small device MISC 1 each, 1 each, Other, Once, Norins, Michael E, MD .  albuterol (PROVENTIL) (2.5 MG/3ML) 0.083% nebulizer solution 2.5 mg, 2.5 mg, Inhalation, Q6H PRN, Norins, Heinz Knuckles, MD .  amLODipine (NORVASC) tablet 5 mg, 5 mg, Per Tube, Daily, Norins, Heinz Knuckles, MD, 5 mg at 02/07/20 0849 .  amoxicillin-clavulanate (AUGMENTIN) 400-57 MG/5ML suspension 875 mg, 875 mg, Per Tube, Q12H, Lorella Nimrod, MD, 875 mg at 02/07/20 1521 .  antiseptic oral rinse (BIOTENE)  solution, , Mouth Rinse, QID, Norins, Heinz Knuckles, MD, Given at 02/07/20 1521 .  enoxaparin (LOVENOX) injection 40 mg, 40 mg, Subcutaneous, Q24H, Norins, Heinz Knuckles, MD, 40 mg at 02/06/20 2317 .  feeding supplement (ENSURE ENLIVE) (ENSURE ENLIVE) liquid 237 mL, 237 mL, Oral, TID, Norins, Heinz Knuckles, MD, 237 mL at 02/07/20 1521 .  ketorolac (TORADOL) 30 MG/ML injection 15 mg, 15 mg, Intravenous, Q8H, Sharion Settler, NP, 15 mg at 02/07/20 1519 .  [START ON 02/08/2020] levothyroxine (SYNTHROID) tablet 75 mcg, 75 mcg, Oral, Q0600, Lorella Nimrod, MD .  meclizine (ANTIVERT) tablet 12.5 mg, 12.5 mg, Per Tube, TID PRN, Norins, Heinz Knuckles, MD .  melatonin tablet 5 mg, 5 mg, Per Tube, QHS, Sharion Settler, NP, 5 mg at 02/07/20 0131 .  pantoprazole (PROTONIX) EC tablet 40 mg, 40 mg, Oral, Daily, Norins, Heinz Knuckles, MD, 40 mg at 02/07/20 0849    ALLERGIES   Clavulanic acid     REVIEW OF SYSTEMS    Review of Systems:  Gen:  Denies  fever, sweats, chills weigh loss  HEENT: Denies blurred vision, double vision, ear pain, eye pain, hearing loss, nose bleeds, sore throat Cardiac:  No dizziness, chest pain or heaviness, chest tightness,edema Resp:   Denies cough or sputum porduction, shortness of breath,wheezing, hemoptysis,  Gi: Denies swallowing difficulty, stomach pain, nausea or vomiting, diarrhea, constipation, bowel incontinence Gu:  Denies bladder incontinence, burning urine Ext:   Denies Joint pain, stiffness or swelling Skin: Denies  skin rash, easy bruising or bleeding or hives Endoc:  Denies polyuria, polydipsia , polyphagia or weight change Psych:   Denies depression, insomnia or hallucinations   Other:  All other systems negative   VS: BP (!) 150/91 (BP Location: Left Arm)   Pulse (!) 119   Temp 97.9 F (36.6 C) (Axillary)   Resp 18   Ht _0  (1.626 m)   Wt 46 kg   SpO2 96%   BMI 17.41 kg/m      PHYSICAL EXAM    GENERAL:NAD, no fevers, chills, no weakness no  fatigue HEAD: Normocephalic, atraumatic.  EYES: Pupils equal, round, reactive to light. Extraocular muscles intact. No scleral icterus.  MOUTH: Moist mucosal membrane. Dentition intact. No abscess noted.  EAR, NOSE, THROAT: Clear without exudates. No external lesions.  NECK: Supple. No thyromegaly. No nodules. No JVD.  PULMONARY: Diffuse coarse rhonchi bilaterally CARDIOVASCULAR: S1 and S2. Regular rate and rhythm. No murmurs, rubs, or gallops. No edema. Pedal pulses 2+ bilaterally.  GASTROINTESTINAL: Soft, nontender, nondistended. No masses. Positive bowel sounds. No hepatosplenomegaly.  MUSCULOSKELETAL: No swelling, clubbing, or edema. Range of motion full in all extremities.  NEUROLOGIC: Cranial nerves II through XII are intact. No gross focal neurological deficits. Sensation intact. Reflexes intact.  SKIN: No ulceration, lesions, rashes, or cyanosis. Skin warm and  dry. Turgor intact.  PSYCHIATRIC: Mood, affect within normal limits. The patient is awake, alert and oriented x 3. Insight, judgment intact.       IMAGING    CT Angio Chest PE W and/or Wo Contrast  Result Date: 02/06/2020 CLINICAL DATA:  Shortness of breath and left chest pain EXAM: CT ANGIOGRAPHY CHEST WITH CONTRAST TECHNIQUE: Multidetector CT imaging of the chest was performed using the standard protocol during bolus administration of intravenous contrast. Multiplanar CT image reconstructions and MIPs were obtained to evaluate the vascular anatomy. CONTRAST:  65m OMNIPAQUE IOHEXOL 350 MG/ML SOLN COMPARISON:  January 05, 2020 FINDINGS: Cardiovascular: There is a optimal opacification of the pulmonary arteries. There is no central,segmental, or subsegmental filling defects within the pulmonary arteries. There is mild cardiomegaly. No pericardial effusion or thickening. No evidence right heart strain. There is normal three-vessel brachiocephalic anatomy without proximal stenosis. Scattered aortic calcifications and coronary artery  calcifications are present. Mediastinum/Nodes: Pretracheal and subcarinal adenopathy is present within the subcarinal region the largest measuring 1.5 cm in short axis diameter, likely reactive. Thyroid gland, trachea, and esophagus demonstrate no significant findings. Lungs/Pleura: Interval progression extensive multifocal patchy airspace consolidation seen throughout both lungs. There scattered areas of bronchiectasis and tree-in-bud opacities. There is also reticulonodular opacity seen predominantly at both lung bases. There is a trace right pleural effusion present. Upper Abdomen: No acute abnormalities present in the visualized portions of the upper abdomen. Musculoskeletal: No chest wall abnormality. No acute or significant osseous findings. Review of the MIP images confirms the above findings. IMPRESSION: 1.  No central, segmental, or subsegmental pulmonary embolism. 2. Interval progression in extensive multifocal airspace opacities and consolidation, consistent with multifocal pneumonia. 3. Trace right pleural effusion. 4.  Aortic Atherosclerosis (ICD10-I70.0). Electronically Signed   By: BPrudencio PairM.D.   On: 02/06/2020 18:46   DG Chest Port 1 View  Result Date: 02/06/2020 CLINICAL DATA:  Shortness of breath, treated for chronic pneumonia EXAM: PORTABLE CHEST 1 VIEW COMPARISON:  CT 01/05/2020, radiograph 12/18/2019 FINDINGS: Increasingly coalescent airspace opacities throughout both lungs right slightly greater than left with some associated interstitial opacities as well. Findings are likely to reflect a progression of the process seen on comparison CT 01/05/2020. No pneumothorax or visible effusion. Stable cardiomediastinal contours. No acute osseous or soft tissue abnormality. Degenerative changes are present in the imaged spine and shoulders. Telemetry leads overlie the chest. IMPRESSION: Increasingly coalescent airspace opacities throughout both lungs right slightly greater than left, may reflect  progression of a multifocal infectious/inflammatory process seen on CT 01/05/2020. Electronically Signed   By: PLovena LeM.D.   On: 02/06/2020 17:24     Final report (A)  [facility]   Result 1 - [facility] Klebsiella pneumoniae (A)Comment: Heavy growth  [facility]   Result 2 - [facility] Comment Comment:  Routine respiratory flora Heavy growth  [facility]   Antimicrobial Susceptibility - [facility] Comment Comment:    ** S = Susceptible; I = Intermediate; R = Resistant **          P = Positive; N = Negative      MICS are expressed in micrograms per mL  Antibiotic         RSLT#1  RSLT#2  RSLT#3  RSLT#4 Amoxicillin/Clavulanic Acid  S Ampicillin           R Cefepime            S Ceftriaxone          S Cefuroxime  S Ciprofloxacin         S Ertapenem           S Gentamicin           S Imipenem            S Levofloxacin          S Meropenem           S Piperacillin/Tazobactam    S Tetracycline          S Tobramycin           S Trimethoprim/Sulfa       S  [facility]    Sputum Culture - [facility] (01/26/2020 10:10 AM EDT)  Specimen  Other        ASSESSMENT/PLAN   Bilateral multifocal pneumonia   - present on admission    - due to Klebsiella pneumoniae on resp cx earlier this month July 2021   - there appears to be chronic recurrent aspiration   - would like to review with IR if possible to change PEG to Jejunal feeding tube.    - continue augmentin and will add Levoquin due to persistent and refractory course   - patient has thick phlegm unable toclear despite VEST therapy - will add mucomyst 48m BID to help thin out phlegm   -PT/OT when able   - agree with palliative care consult - have encouraged patient to participate with Palliative recommendations       Thank you for  allowing me to participate in the care of this patient.   Patient/Family are satisfied with care plan and all questions have been answered.  This document was prepared using Dragon voice recognition software and may include unintentional dictation errors.     FOttie Glazier M.D.  Division of [place]

## 2020-02-08 DIAGNOSIS — Z515 Encounter for palliative care: Secondary | ICD-10-CM

## 2020-02-08 DIAGNOSIS — Z7189 Other specified counseling: Secondary | ICD-10-CM

## 2020-02-08 LAB — CBC
HCT: 35.8 % — ABNORMAL LOW (ref 36.0–46.0)
Hemoglobin: 12.1 g/dL (ref 12.0–15.0)
MCH: 31.1 pg (ref 26.0–34.0)
MCHC: 33.8 g/dL (ref 30.0–36.0)
MCV: 92 fL (ref 80.0–100.0)
Platelets: 328 10*3/uL (ref 150–400)
RBC: 3.89 MIL/uL (ref 3.87–5.11)
RDW: 12.7 % (ref 11.5–15.5)
WBC: 17.2 10*3/uL — ABNORMAL HIGH (ref 4.0–10.5)
nRBC: 0 % (ref 0.0–0.2)

## 2020-02-08 LAB — BASIC METABOLIC PANEL
Anion gap: 13 (ref 5–15)
BUN: 27 mg/dL — ABNORMAL HIGH (ref 8–23)
CO2: 30 mmol/L (ref 22–32)
Calcium: 9.2 mg/dL (ref 8.9–10.3)
Chloride: 92 mmol/L — ABNORMAL LOW (ref 98–111)
Creatinine, Ser: 0.7 mg/dL (ref 0.44–1.00)
GFR calc Af Amer: >60 mL/min (ref >60)
GFR calc non Af Amer: >60 mL/min (ref >60)
Glucose, Bld: 116 mg/dL — ABNORMAL HIGH (ref 70–99)
Potassium: 3.6 mmol/L (ref 3.5–5.1)
Sodium: 135 mmol/L (ref 135–145)

## 2020-02-08 LAB — EXPECTORATED SPUTUM ASSESSMENT W GRAM STAIN, RFLX TO RESP C

## 2020-02-08 LAB — T4, FREE: Free T4: 0.97 ng/dL (ref 0.61–1.12)

## 2020-02-08 MED ORDER — LEVOTHYROXINE SODIUM 88 MCG PO TABS
88.0000 ug | ORAL_TABLET | Freq: Every day | ORAL | Status: DC
  Administered 2020-02-08 – 2020-02-10 (×3): 88 ug via ORAL
  Filled 2020-02-08 (×3): qty 1

## 2020-02-08 MED ORDER — CHLORHEXIDINE GLUCONATE CLOTH 2 % EX PADS
6.0000 | MEDICATED_PAD | Freq: Every day | CUTANEOUS | Status: DC
  Administered 2020-02-09: 10:00:00 6 via TOPICAL

## 2020-02-08 MED ORDER — LEVOTHYROXINE SODIUM 100 MCG PO TABS: 100.0000 ug | ORAL_TABLET | Freq: Every day | ORAL | Status: DC

## 2020-02-08 MED ORDER — METOPROLOL TARTRATE 5 MG/5ML IV SOLN
10.0000 mg | INTRAVENOUS | Status: DC | PRN
  Administered 2020-02-08 – 2020-02-10 (×3): 10 mg via INTRAVENOUS
  Filled 2020-02-08 (×4): qty 10

## 2020-02-08 MED ORDER — METOPROLOL TARTRATE 25 MG/10 ML ORAL SUSPENSION
12.5000 mg | Freq: Two times a day (BID) | ORAL | Status: DC
  Administered 2020-02-08 (×2): 12.5 mg
  Filled 2020-02-08: qty 5

## 2020-02-08 MED ORDER — MUPIROCIN 2 % EX OINT
1.0000 "application " | TOPICAL_OINTMENT | Freq: Two times a day (BID) | CUTANEOUS | Status: DC
  Administered 2020-02-08 – 2020-02-10 (×5): 1 via NASAL
  Filled 2020-02-08: qty 22

## 2020-02-08 MED ORDER — METOPROLOL TARTRATE 25 MG/10 ML ORAL SUSPENSION
25.0000 mg | Freq: Two times a day (BID) | ORAL | Status: DC
  Administered 2020-02-09 – 2020-02-10 (×2): 25 mg
  Filled 2020-02-08 (×2): qty 10

## 2020-02-08 NOTE — Evaluation (Addendum)
Clinical/Bedside Swallow Evaluation Patient Details  Name: Crystal Kent MRN: [id number] Date of Birth: 1946-01-16  Today's Date: 02/08/2020 Time: SLP Start Time (ACUTE ONLY): 29 SLP Stop Time (ACUTE ONLY): 1150 SLP Time Calculation (min) (ACUTE ONLY): 60 min  Past Medical History:  Past Medical History:  Diagnosis Date  . Acid reflux   . Allergy   . 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 Difficult intubation    Pt reports "small airway"  . Feeding by G-tube ([place])    Nothing by mouth  . Hypertension   . Personal history of chemotherapy 1999  . Personal history of radiation therapy   . Thyroid disease   . Uses prosthesis    plate to cover hole in roof of mouth s/p cancer surgery, doesn't wear anymore  . Vertigo    no episodes for several years   Past Surgical History:  Past Surgical History:  Procedure Laterality Date  . ABDOMINAL HYSTERECTOMY    . BREAST BIOPSY Left    bx/clip-neg  . hemimandibulectomy Right 1998  . MYRINGOTOMY WITH TUBE PLACEMENT Right 02/27/2017   Procedure: MYRINGOTOMY WITH TUBE PLACEMENT Right ear.;  Surgeon: Margaretha Sheffield, MD;  Location: [facility];  Service: ENT;  Laterality: Right;  . MYRINGOTOMY WITH TUBE PLACEMENT Right 12/09/2019   Procedure: EXAM UNDER ANESTHESIA REMOVAL OF CERUMEN;  Surgeon: Margaretha Sheffield, MD;  Location: [facility] [facility];  Service: ENT;  Laterality: Right;  . partial maxillectomy Right   . PEG PLACEMENT N/A 05/27/2017   Procedure: PERCUTANEOUS ENDOSCOPIC GASTROSTOMY (PEG) PLACEMENT;  Surgeon: Lucilla Lame, MD;  Location: [facility];  Service: Endoscopy;  Laterality: N/A;  . PEG PLACEMENT N/A 09/07/2018   Procedure: PERCUTANEOUS ENDOSCOPIC GASTROSTOMY (PEG) PLACEMENT;  Surgeon: Lucilla Lame, MD;  Location: [facility];  Service: Endoscopy;  Laterality: N/A;  . 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  PEG placement for TFs for nutrition/hydration.  She has been followed as an outpatient for multi-focal pneumonia with CT chest 01/05/20 revealing wide-spread ground glass opacity, tree-in-bud centric lobular, mild cylindrical bronchectiasis that radiology v=favored for recurrent aspiration in recent months.  She has had outpatient antibiotics- Augmentin.  D/t increased SOB and chest pain she presented to Urgent Care on the day of admission where she was found to have pneumonia with hypoxemia to O2 sat of 77% on RA.  She was referred to [facility] -ED for further evaluation.  On arrival she was hypoxic requiring 4 L.  CTA negative for PE but revealed multifocal groundglass opacities, tree-in-bud findings.  Initially code sepsis was called and she was started on cefepime and vancomycin.  Sepsis ruled out.  Antibiotics changed to Augmentin per PEG tube.  Pt describes multiple episodes of Regurgitation(even vomiting) of TFs since the first of the year and has had significant difficulty w/ her TFs during bolus feedings at times(this can lead to impact of the Pulmonary status if aspiration of TFs occurs).  Pt uses Ensure for TFs per husband's report.    Assessment / Plan / Recommendation Clinical Impression  Pt seen today for assessment of oropharyngeal strength/weakness; discussion of oropharyngeal phase swallowing and safety of potential oral intake. Pt has a BASELINE h/o oropharyngeal phase dysphagia w/ risk for aspiration; PEG placement s/p oropharyngeal CA in 1998, 1999 (including hard palate and  base of tongue) w/ VC impairment s/p XRT. Pt had reconstruction surgery of mandible impacting R mandible. Pt has relied on PEG TFs for nutrition/hydration w/ pleasure sips of coffee. In recent months, she c/o significant GI dysmotility w/ the TF boluses w/ multiple episodes of  Regurgitation(even vomiting). ANY regurgitation of TFs can increase risk for aspiration of such thus impact Pulmonary status. Pt does not take an oral diet; only pleasure sips of coffee at baseline. She is usually physically active w/ good aerobic activity but has been increasing weak w/ the continuous Pulmonary decline in recent months per her report. During the OM exam, significantly reduce lingual movement noted(little to none) for lateral and protrusion ROM; posterior tone reduce; no protrusion strength noted. Cough was weak w/ reduced effectiveness. Laryngeal excursion reduced during volitional swallow attempts. Oral cavity c/b dryness and sticky mucous. Oral care discussed, instructed on, and provided -- encouraged pt to maintain good oral care for hygiene and oral stimulation for swallowing as well. Discussed pt's oropharyngeal phase dysphagia and swallowing the little coffee that she has been doing for some time post the CA. Pt and husband were cautioned to hold off on drinking such for right now until the PEG TFs situation can be figured out; she gets stronger and can be back on her feet doing (aerobic) moving about activity (helps to clean up small aspiration). We discussed the honest truth of high possibility of aspiration of the coffee in light of her OM weakness including minimal lingual movement s/p the oral CA and txs including reconstruction.  Educated pt and husband on frequent oral care; gave swabs and handouts. ST services will be available for continued education while admitted as needed. NSG and MD updated.  OF Note: per chart notes, and husband's report, MD has discussed the option of moving to a J-Tube d/t regurgitation issues from the PEG tube level -- the regurgitation of TFs may be impacting pt's Pulmonary status.  SLP Visit Diagnosis: Dysphagia, oropharyngeal phase (R13.12) (Baseline oropharyngeal CA w/ reconstruction)    Aspiration Risk  Severe aspiration risk;Risk for inadequate  nutrition/hydration (has PEG)    Diet Recommendation  NPO - pt has PEG baseline for nutrition/hydration needs; frequent oral care for hygiene and stimulation of swallowing  Medication Administration: Via alternative means (PEG)    Other  Recommendations Recommended Consults:  (Dietician f/u; GI f/u for PEG option) Oral Care Recommendations: Oral care QID;Patient independent with oral care   Follow up Recommendations None      Frequency and Duration  n/a         Prognosis Prognosis for Safe Diet Advancement:  (n/a) Barriers to Reach Goals: Time post onset;Severity of deficits (CA)      Swallow Study   General Date of Onset: 02/06/20 HPI: Pt is a 74 y.o.femalewith medical history significant 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  PEG placement for TFs for nutrition/hydration.  She has been followed as an outpatient for multi-focal pneumonia with CT chest 01/05/20 revealing wide-spread ground glass opacity, tree-in-bud centric lobular, mild cylindrical bronchectiasis that radiology v=favored for recurrent aspiration in recent months.  She has had outpatient antibiotics- Augmentin.  D/t increased SOB and chest pain she presented to Urgent Care on the day of admission where she was found to have pneumonia with hypoxemia to O2 sat of 77% on RA.  She was referred to [facility] -ED for further evaluation.  On arrival she was hypoxic requiring  4 L.  CTA negative for PE but revealed multifocal groundglass opacities, tree-in-bud findings.  Initially code sepsis was called and she was started on cefepime and vancomycin.  Sepsis ruled out.  Antibiotics changed to Augmentin per PEG tube.  Pt describes multiple episodes of Regurgitation(even vomiting) of TFs since the first of the year and has had significant difficulty w/ her TFs during bolus feedings at times(this can lead to impact of the Pulmonary status  if aspiration of TFs occurs).  Pt uses Ensure for TFs per husband's report.  Type of Study: Bedside Swallow Evaluation Previous Swallow Assessment: many years ago Diet Prior to this Study: NPO (sips of coffee for pleasure baseline per pt) Temperature Spikes Noted: No (wbc 17.2) Respiratory Status: Nasal cannula (4L) History of Recent Intubation: No Behavior/Cognition: Alert;Cooperative;Pleasant mood ([place]) Oral Cavity Assessment: Dry (sticky) Oral Care Completed by SLP: Yes (and pt) Oral Cavity - Dentition: Adequate natural dentition;Missing dentition (manidble reconstruction) Vision:  (adequate) Self-Feeding Abilities:  (n/a) Patient Positioning: Upright in bed Baseline Vocal Quality: Aphonic (whispers d/t VC dysmotility baseline) Volitional Cough: Weak (poorly effective) Volitional Swallow: Able to elicit (reduced excursion)    Oral/Motor/Sensory Function Overall Oral Motor/Sensory Function: Severe impairment Facial Symmetry:  (reconstruction) Lingual ROM:  (significantly reduced) Lingual Strength: Reduced   Ice Chips  n/a   Thin Liquid  n/a   Nectar Thick  n/a  Honey Thick  n/a  Puree  n/a  Solid      n/a       [name], MS, CCC-SLP [facility] 02/08/2020,4:39 PM

## 2020-02-08 NOTE — TOC Initial Note (Signed)
Transition of Care [facility]) - Initial/Assessment Note    Patient Details  Name: Crystal Kent MRN: [id number] Date of Birth: 11/23/1945  Transition of Care [facility]) CM/SW Contact:    Shelbie Hutching, RN Phone Number: 02/08/2020, 9:19 AM  Clinical Narrative:                 Patient admitted to the hospital with dysphagia and aspiration pneumonia.  Patient is from home with her husband, who goes by Crystal Kent.  Patient is independent at home, she has a Peg tube that she gets all of her food and medications through.  Patient is on acute O2 at 2L.  Patient is current with her PCP and gets her prescriptions from [facility] order and [place].  Patient and her husband agree to home health services at discharge, they have no preference in agency.  Patient has no equipment needs at this time.  Palliative Care is consulted for goals of care.    Expected Discharge Plan: [place] Barriers to Discharge: Continued Medical Work up   Patient Goals and CMS Choice Patient states their goals for this hospitalization and ongoing recovery are:: For her pneumonia to clear up CMS Medicare.gov Compare Post Acute Care list provided to:: Patient Choice offered to / list presented to : Patient  Expected Discharge Plan and Services Expected Discharge Plan: [place]   Discharge Planning Services: CM Consult Post Acute Care Choice: [place] arrangements for the past 2 months: Single Family Home                                      Prior Living Arrangements/Services Living arrangements for the past 2 months: Single Family Home Lives with:: Spouse Patient language and need for interpreter reviewed:: Yes Do you feel safe going back to the place where you live?: Yes      Need for Family Participation in Patient Care: Yes (Comment) (hard pallate cancer- aspiration) Care giver support system in place?: Yes (comment) (husband)   Criminal Activity/Legal  Involvement Pertinent to Current Situation/Hospitalization: No - Comment as needed  Activities of Daily Living Home Assistive Devices/Equipment: Eyeglasses, External Monitoring Devices, Enteral Feeding Supplies, Feeding equipment ADL Screening (condition at time of admission) Patient's cognitive ability adequate to safely complete daily activities?: Yes Is the patient deaf or have difficulty hearing?: No Does the patient have difficulty seeing, even when wearing glasses/contacts?: No Does the patient have difficulty concentrating, remembering, or making decisions?: No Patient able to express need for assistance with ADLs?: Yes Does the patient have difficulty dressing or bathing?: No Independently performs ADLs?: No Does the patient have difficulty walking or climbing stairs?: No Weakness of Legs: None Weakness of Arms/Hands: None  Permission Sought/Granted Permission sought to share information with : Case Manager, Family Supports, Other (comment) Permission granted to share information with : Yes, Verbal Permission Granted  Share Information with NAME: Crystal Kent- goes by Crystal Kent granted to share info w AGENCY: [facility] agency  Permission granted to share info w Relationship: Husband     Emotional Assessment Appearance:: Appears stated age Attitude/Demeanor/Rapport: Engaged Affect (typically observed): Accepting Orientation: : Oriented to Self, Oriented to Place, Oriented to  Time, Oriented to Situation Alcohol / Substance Use: Not Applicable Psych Involvement: No (comment)  Admission diagnosis:  Aspiration pneumonia ([place]) [J69.0] Multifocal pneumonia [J18.9] Sepsis with acute hypoxic respiratory failure without  septic shock, due to unspecified organism ([place]) [A41.9, R65.20, J96.01] Patient Active Problem List   Diagnosis Date Noted  . Multifocal pneumonia 02/06/2020  . 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 Mechanical complication of gastrostomy ([place])   .  Oral candida 03/16/2018  . Tinea corporis 12/29/2017  . Insomnia 04/23/2017  . 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 Essential hypertension 06/28/2016  . Allergic rhinitis 06/28/2016  . Current use of proton pump inhibitor 06/28/2016  . Gastrostomy in place [facility]) 05/02/2016  . Chronic pansinusitis 02/13/2016  . Eustachian tube dysfunction 12/14/2014  . Hearing loss 12/14/2014  . Lesion of buccal mucosa 12/14/2014  . Dysphagia, oropharyngeal 05/23/2014  . Hypothyroidism 04/27/2014  . 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

## 2020-02-08 NOTE — Progress Notes (Signed)
PROGRESS NOTE    Crystal KEEVEN  OVZ:[id number] DOB: Jan 18, 1946 DOA: 02/06/2020 PCP: Barbaraann Boys, MD   Brief Narrative:  Crystal Kent is a 74 y.o. 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 She has been followed as an outpatient for multi-focal pneumonia with CT chest 01/05/20 revealing wide-sp0read ground glass opacity, tree-in-bud centric lobular, mild cylindrical bronchectiasis that radiology v=favored for recurrent aspiration. She has had outpatient antibiotics - Augmentin. For increased SOB and chest pain she presented to Urgent Care on the day of admission where she was found to have pneumonia with hypoxemia to O2 sat of 77% on RA. She was referred to [facility] for further evaluation.  On arrival she was hypoxic requiring 4 L. Had procalcitonin positive at 0.87.  CTA negative for PE but revealed multifocal groundglass opacities, tree-in-bud findings.  Initially code sepsis was called and she was started on cefepime and vancomycin.  Sepsis ruled out.  Antibiotics changed to Augmentin per PEG tube.  Subjective: Patient speaks in whispers.  Denies any complaint.  She is pushing chocolate Ensure while her PEG tube. Husband at bedside sharing concerned that chocolate Ensure is giving her loose stool and would like to have vanilla.  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  Has chronic aspiration secondary to her surgeries for oropharyngeal cancer.  She followed up with pulmonary.  Recent sputum culture done earlier this month grew Klebsiella with good sensitivity except ampicillin.  She does do some suctioning at home. No home oxygen use.  Currently on 4 L. CT chest with similar findings.  Dr. Laural Roes following  She was started on azithromycin and cefepime on admission. Patient also has a very weak cough and unable to clear her secretions.  Looks like patient and her husband does not have whole insight of her disease. -Switched from cefepime and azithromycin to Augmentin through PEG tube on 7/19.  Pulmonary added Levaquin on 7/19 due to refractory course.  Also added Mucomyst to help thin out secretions -Swallow evaluation further recommendations as patient wants to continue taking a cup of coffee daily at least. -Continue chest PT. -Pulmonary would like to review with IR if possible to change PEG to jejunal feeding tube -Might need oxygen for home-we will try weaning her off if unsuccessful then we will evaluate her for home oxygen needs. 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  Patient has tried Tylenol 3 with some relief.  Prescription was not renewed.  She was also given a trial of gabapentin teen and Lyrica and unable to tolerate it. -She was started on Tylenol 3 through PEG tube. -She needs to follow-up with her pain clinic for further recommendations.  Hypertension -staying on the lower end of normal likely due to ongoing infection.  Stopping amlodipine on 7/20 -increase metoprolol to 25 mg twice daily via PEG tube to help with her sinus tachycardia.  Hypothyroidism. -TSH was elevated at 13.56 on 7/19, Synthroid dose increased to 88 mcg on 7/19.  Objective: Vitals:   02/08/20 0732 02/08/20 0823 02/08/20 0851 02/08/20 1124  BP:    108/66  Pulse:      Resp: (!) 27  20 18   Temp: 99.2 F (37.3  C)   97.9 F (36.6 C)  TempSrc: Axillary   Oral  SpO2:  96%  97%  Weight:      Height:        Intake/Output Summary (Last 24 hours) at 02/08/2020 1223 Last data filed at 02/08/2020 0500 Gross per 24 hour  Intake 150 ml  Output --  Net 150 ml   Filed Weights   02/06/20 1633 02/08/20 0500  Weight: 46 kg 45 kg     Examination:  General exam:, Emaciated elderly lady, disproportionate lower face secondary to surgeries, appears calm and comfortable  Respiratory system: Scattered bilateral rhonchi, respiratory effort normal. Cardiovascular system: S1 & S2 heard, RRR. Gastrointestinal system: Soft, nontender, nondistended, bowel sounds positive.  PEG tube in place. Central nervous system: Alert and oriented. No focal neurological deficits.Symmetric 5 x 5 power. Extremities: No edema, no cyanosis, pulses intact and symmetrical. Psychiatry: Judgement and insight appear normal. Mood & affect appropriate.    DVT prophylaxis: Lovenox Code Status: Partial Family Communication: Husband was updated at bedside. Disposition Plan:  Status is: Inpatient  Remains inpatient appropriate because:Inpatient level of care appropriate due to severity of illness   Dispo: The patient is from: Home              Anticipated d/c is to: Home              Anticipated d/c date is: 1 day              Patient currently is not medically stable to d/c.  Patient with oropharyngeal cancer s/p chemoradiation and surgery.  Chronic aspiration on her own secretions, very weak cough.  Is high risk for deterioration and death.  Palliative care consult is still pending.   Consultants:   Pulmonary  Palliative care  Procedures:  Antimicrobials:  Augmentin  Data Reviewed: I have personally reviewed following labs and imaging studies  CBC: Recent Labs  Lab 02/06/20 1636 02/08/20 0412  WBC 11.9* 17.2*  NEUTROABS 10.3*  --   HGB 10.2* 12.1  HCT 31.5* 35.8*  MCV 95.5 92.0  PLT 269 734   Basic Metabolic Panel: Recent Labs  Lab 02/06/20 1726 02/07/20 2008 02/08/20 0412  NA 130* 133* 135  K 3.8 3.4* 3.6  CL 95* 91* 92*  CO2 25 28 30   GLUCOSE 110* 125* 116*  BUN 20 21 27*  CREATININE 0.82 0.73 0.70  CALCIUM 8.9 9.3 9.2  MG  --  1.9  --    GFR: Estimated Creatinine Clearance: 43.2 mL/min (by C-G formula based on  SCr of 0.7 mg/dL). Liver Function Tests: Recent Labs  Lab 02/06/20 1726  AST 30  ALT 23  ALKPHOS 110  BILITOT 1.2  PROT 7.4  ALBUMIN 3.5   Recent Labs  Lab 02/06/20 1726  LIPASE 22   No results for input(s): AMMONIA in the last 168 hours. Coagulation Profile: Recent Labs  Lab 02/06/20 1636  INR 1.1   Cardiac Enzymes: No results for input(s): CKTOTAL, CKMB, CKMBINDEX, TROPONINI in the last 168 hours. BNP (last 3 results) No results for input(s): PROBNP in the last 8760 hours. HbA1C: No results for input(s): HGBA1C in the last 72 hours. CBG: Recent Labs  Lab 02/07/20 2108  GLUCAP 141*   Lipid Profile: No results for input(s): CHOL, HDL, LDLCALC, TRIG, CHOLHDL, LDLDIRECT in the last 72 hours. Thyroid Function Tests: Recent Labs    02/07/20 2008 02/08/20 0412  TSH 13.565*  --   FREET4  --  0.97  Anemia Panel: No results for input(s): VITAMINB12, FOLATE, FERRITIN, TIBC, IRON, RETICCTPCT in the last 72 hours. Sepsis Labs: Recent Labs  Lab 02/06/20 1636 02/06/20 1726  PROCALCITON  --  0.87  LATICACIDVEN 1.9  --     Recent Results (from the past 240 hour(s))  Blood Culture (routine x 2)     Status: None (Preliminary result)   Collection Time: 02/06/20  4:36 PM   Specimen: BLOOD  Result Value Ref Range Status   Specimen Description BLOOD RAC  Final   Special Requests   Final    BOTTLES DRAWN AEROBIC AND ANAEROBIC Blood Culture adequate volume   Culture   Final    NO GROWTH 2 DAYS Performed at [facility], [address], [place], [place] [address]    Report Status PENDING  Incomplete  Blood Culture (routine x 2)     Status: None (Preliminary result)   Collection Time: 02/06/20  4:36 PM   Specimen: BLOOD  Result Value Ref Range Status   Specimen Description BLOOD LAC  Final   Special Requests   Final    BOTTLES DRAWN AEROBIC AND ANAEROBIC Blood Culture adequate volume   Culture   Final    NO GROWTH 2 DAYS Performed at [facility], [address], [place], [place] [address]    Report Status PENDING  Incomplete  Urine culture     Status: Abnormal (Preliminary result)   Collection Time: 02/06/20  4:36 PM   Specimen: In/Out Cath Urine  Result Value Ref Range Status   Specimen Description   Final    IN/OUT CATH URINE Performed at [facility], [address] [place], [place] [address]    Special Requests   Final    NONE Performed at [facility], [address], [place], [place] [address]    Culture (A)  Final    100 COLONIES/mL STAPHYLOCOCCUS AUREUS CULTURE REINCUBATED FOR BETTER GROWTH Performed at [facility], [place] [address] [place], [place] [address]    Report Status PENDING  Incomplete  SARS Coronavirus 2 by RT PCR (hospital order, performed in [facility] hospital lab) Nasopharyngeal Nasopharyngeal Swab     Status: None   Collection Time: 02/06/20  6:41 PM   Specimen: Nasopharyngeal Swab  Result Value Ref Range Status   SARS Coronavirus 2 NEGATIVE NEGATIVE Final    Comment: (NOTE) SARS-CoV-2 target nucleic acids are NOT DETECTED.  The SARS-CoV-2 RNA is generally detectable in upper and lower respiratory specimens during the acute phase of infection. The lowest concentration of SARS-CoV-2 viral copies this assay can detect is 250 copies / mL. A negative result does not preclude SARS-CoV-2 infection and should not be used as the sole basis for treatment or other patient management decisions.  A negative result may occur with improper specimen collection / handling, submission of specimen other than nasopharyngeal swab, presence of viral mutation(s) within the areas targeted by this assay, and inadequate number of viral copies (<250 copies / mL). A negative result must be combined with clinical observations, patient history, and epidemiological information.  Fact Sheet for Patients:   StrictlyIdeas.no  Fact Sheet for Healthcare  Providers: BankingDealers.co.za  This test is not yet approved or  cleared by the [place] FDA and has been authorized for detection and/or diagnosis of SARS-CoV-2 by FDA under an Emergency Use Authorization (EUA).  This EUA will remain in effect (meaning this test can be used) for the duration of the COVID-19 declaration under  Section 564(b)(1) of the Act, 21 U.S.C. section 360bbb-3(b)(1), unless the authorization is terminated or revoked sooner.  Performed at [facility], [address] [place], [place] [address]   Expectorated sputum assessment w rflx to resp cult     Status: None   Collection Time: 02/07/20  5:30 PM   Specimen: Sputum  Result Value Ref Range Status   Specimen Description SPUTUM  Final   Special Requests NONE  Final   Sputum evaluation   Final    THIS SPECIMEN IS ACCEPTABLE FOR SPUTUM CULTURE Performed at [facility], [address] [place], [place] [address]    Report Status 02/08/2020 FINAL  Final  MRSA PCR Screening     Status: Abnormal   Collection Time: 02/07/20  5:30 PM   Specimen: Nasopharyngeal  Result Value Ref Range Status   MRSA by PCR POSITIVE (A) NEGATIVE Final    Comment:        The GeneXpert MRSA Assay (FDA approved for NASAL specimens only), is one component of a comprehensive MRSA colonization surveillance program. It is not intended to diagnose MRSA infection nor to guide or monitor treatment for MRSA infections. RESULT CALLED TO, READ BACK BY AND VERIFIED WITH: WANETTE MILES @1913  ON 02/07/20 SKL Performed at [facility], [address] [place], [place] [address]   Respiratory Panel by PCR     Status: None   Collection Time: 02/07/20  5:30 PM   Specimen: Nasopharyngeal Swab; Respiratory  Result Value Ref Range Status   Adenovirus NOT DETECTED NOT DETECTED Final   Coronavirus 229E NOT DETECTED NOT DETECTED Final    Comment: (NOTE) The Coronavirus on the Respiratory  Panel, DOES NOT test for the novel  Coronavirus (2019 nCoV)    Coronavirus HKU1 NOT DETECTED NOT DETECTED Final   Coronavirus NL63 NOT DETECTED NOT DETECTED Final   Coronavirus OC43 NOT DETECTED NOT DETECTED Final   Metapneumovirus NOT DETECTED NOT DETECTED Final   Rhinovirus / Enterovirus NOT DETECTED NOT DETECTED Final   Influenza A NOT DETECTED NOT DETECTED Final   Influenza B NOT DETECTED NOT DETECTED Final   Parainfluenza Virus 1 NOT DETECTED NOT DETECTED Final   Parainfluenza Virus 2 NOT DETECTED NOT DETECTED Final   Parainfluenza Virus 3 NOT DETECTED NOT DETECTED Final   Parainfluenza Virus 4 NOT DETECTED NOT DETECTED Final   Respiratory Syncytial Virus NOT DETECTED NOT DETECTED Final   Bordetella pertussis NOT DETECTED NOT DETECTED Final   Chlamydophila pneumoniae NOT DETECTED NOT DETECTED Final   Mycoplasma pneumoniae NOT DETECTED NOT DETECTED Final    Comment: Performed at [facility], [place]. [address], [place], [place] [address]  Culture, respiratory     Status: None (Preliminary result)   Collection Time: 02/07/20  5:30 PM   Specimen: SPU  Result Value Ref Range Status   Specimen Description   Final    SPUTUM Performed at [facility], [address] [place], [place] [address]    Special Requests   Final    NONE Reflexed from [phone] Performed at [facility], [address] [place], [place] [address]    Gram Stain   Final    FEW WBC PRESENT, PREDOMINANTLY PMN MODERATE GRAM POSITIVE COCCI IN PAIRS IN CHAINS FEW YEAST Performed at [facility], [place] [address] [place], [place] [address]    Culture PENDING  Incomplete   Report Status PENDING  Incomplete     Radiology Studies: CT Angio Chest PE W  and/or Wo Contrast  Result Date: 02/06/2020 CLINICAL DATA:  Shortness of breath and left chest pain EXAM: CT ANGIOGRAPHY CHEST WITH CONTRAST TECHNIQUE: Multidetector CT imaging of the chest was performed using the standard protocol during  bolus administration of intravenous contrast. Multiplanar CT image reconstructions and MIPs were obtained to evaluate the vascular anatomy. CONTRAST:  23mL OMNIPAQUE IOHEXOL 350 MG/ML SOLN COMPARISON:  January 05, 2020 FINDINGS: Cardiovascular: There is a optimal opacification of the pulmonary arteries. There is no central,segmental, or subsegmental filling defects within the pulmonary arteries. There is mild cardiomegaly. No pericardial effusion or thickening. No evidence right heart strain. There is normal three-vessel brachiocephalic anatomy without proximal stenosis. Scattered aortic calcifications and coronary artery calcifications are present. Mediastinum/Nodes: Pretracheal and subcarinal adenopathy is present within the subcarinal region the largest measuring 1.5 cm in short axis diameter, likely reactive. Thyroid gland, trachea, and esophagus demonstrate no significant findings. Lungs/Pleura: Interval progression extensive multifocal patchy airspace consolidation seen throughout both lungs. There scattered areas of bronchiectasis and tree-in-bud opacities. There is also reticulonodular opacity seen predominantly at both lung bases. There is a trace right pleural effusion present. Upper Abdomen: No acute abnormalities present in the visualized portions of the upper abdomen. Musculoskeletal: No chest wall abnormality. No acute or significant osseous findings. Review of the MIP images confirms the above findings. IMPRESSION: 1.  No central, segmental, or subsegmental pulmonary embolism. 2. Interval progression in extensive multifocal airspace opacities and consolidation, consistent with multifocal pneumonia. 3. Trace right pleural effusion. 4.  Aortic Atherosclerosis (ICD10-I70.0). Electronically Signed   By: Prudencio Pair M.D.   On: 02/06/2020 18:46   DG Chest Port 1 View  Result Date: 02/07/2020 CLINICAL DATA:  74 year old female with tachycardia. EXAM: PORTABLE CHEST 1 VIEW COMPARISON:  Chest radiograph  dated 02/06/2020 and CT dated 02/06/2020 FINDINGS: Confluent bilateral airspace opacities slightly worsened in the right upper lobe compared to the prior radiograph. Small bilateral pleural effusions. No pneumothorax. The cardiac silhouette is within limits. No acute osseous pathology. IMPRESSION: Multifocal pneumonia with slight interval worsening of the right upper lobe airspace opacity compared to the prior radiograph. Continued follow-up recommended. Electronically Signed   By: Anner Crete M.D.   On: 02/07/2020 20:02   DG Chest Port 1 View  Result Date: 02/06/2020 CLINICAL DATA:  Shortness of breath, treated for chronic pneumonia EXAM: PORTABLE CHEST 1 VIEW COMPARISON:  CT 01/05/2020, radiograph 12/18/2019 FINDINGS: Increasingly coalescent airspace opacities throughout both lungs right slightly greater than left with some associated interstitial opacities as well. Findings are likely to reflect a progression of the process seen on comparison CT 01/05/2020. No pneumothorax or visible effusion. Stable cardiomediastinal contours. No acute osseous or soft tissue abnormality. Degenerative changes are present in the imaged spine and shoulders. Telemetry leads overlie the chest. IMPRESSION: Increasingly coalescent airspace opacities throughout both lungs right slightly greater than left, may reflect progression of a multifocal infectious/inflammatory process seen on CT 01/05/2020. Electronically Signed   By: Lovena Le M.D.   On: 02/06/2020 17:24    Scheduled Meds: . acetylcysteine  4 mL Nebulization BID  . AeroChamber Plus Flo-Vu Small  1 each Other Once  . amLODipine  5 mg Per Tube Daily  . amoxicillin-clavulanate  875 mg Per Tube Q12H  . antiseptic oral rinse   Mouth Rinse QID  . Chlorhexidine Gluconate Cloth  6 each Topical Q0600  . enoxaparin (LOVENOX) injection  40 mg Subcutaneous Q24H  . feeding supplement (ENSURE ENLIVE)  237 mL Oral TID  . ketorolac  15 mg Intravenous Q8H  .  levofloxacin  500 mg Oral Daily  . levothyroxine  88 mcg Oral Q0600  . melatonin  5 mg Per Tube QHS  . metoprolol tartrate  12.5 mg Per Tube BID  . mupirocin ointment  1 application Nasal BID  . pantoprazole  40 mg Oral Daily  . potassium chloride  20 mEq Per Tube TID   Continuous Infusions:   LOS: 2 days   Time spent: 45 minutes.  I personally reviewed her chart and images.  More than 50% of that time was spent in direct patient care and discussion with other providers.  Max Sane, MD [facility]  If 7PM-7AM, please contact night-coverage [email or web address]  02/08/2020, 12:23 PM   This record has been created using Systems analyst. Errors have been sought and corrected,but may not always be located. Such creation errors do not reflect on the standard of care.

## 2020-02-08 NOTE — Consult Note (Addendum)
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  Clinical Assessment and Goals of Care: Patient is sitting in bed. Husband is at bedside. They live at home together. The have 2/3 children living. They have several grandchildren, and great-grandchildren.   We discussed her history of cancer, and previous procedures. In one procedure, she had a partial laryngectomy. She has had a feeding tube for the past 22 years. She still cooks for her husband daily, and for her family on Sundays. Her QOL is good. She complains of mouth pain. She states Tylenol 3 works for this.    We discussed her diagnosis, prognosis, GOC, EOL wishes disposition and options.  A detailed discussion was had today regarding advanced directives.  Concepts specific to code status, artifical feeding and hydration, IV antibiotics and rehospitalization were discussed.  The difference between an aggressive medical intervention path and a comfort care path was discussed.  Values and goals of care important to patient and family were attempted to be elicited.  Discussed limitations of medical interventions to prolong quality of life in some situations and discussed the concept of human mortality.  She states is okay with her QOL at this time. She would like pain management. She is okay with her feeding tube and states things should improve once the feeding tube is traded for a J tube.    She would not want CPR or ventilator support. She wants to continue to treat the  treatable.   I completed a MOST form today and the signed original was placed in the chart. A photocopy was placed in the chart to be scanned into EMR. The patient outlined their wishes for the following treatment decisions:  Cardiopulmonary Resuscitation: Do Not Attempt Resuscitation (DNR/No CPR)  Medical Interventions: Limited Additional Interventions: Use medical treatment, IV fluids and cardiac monitoring as indicated, DO NOT USE intubation or mechanical ventilation. May consider use of less invasive airway support such as BiPAP or CPAP. Also provide comfort measures. Transfer to the hospital if indicated. Avoid intensive care.   Antibiotics: Antibiotics if indicated  IV Fluids: IV fluids if indicated  Feeding Tube: Feeding tube long-term if indicated         SUMMARY OF RECOMMENDATIONS    Recommend palliative at D/C.   Recommend initiating Tylenol 3 as this has helped her mouth pain.  Recommend palliative at D/C.   DNR/DNI.   Prognosis:   Unable to determine       Primary Diagnoses: Present on Admission: . Dysphagia, oropharyngeal . Essential hypertension . Hypothyroidism . 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 The following  aspects are pertinent.  Past Medical History:  Diagnosis Date  . Acid reflux   . Allergy   . 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 Difficult intubation    Pt reports "small airway"  . Feeding by G-tube ([place])    Nothing by mouth  . Hypertension   . Personal history of chemotherapy 1999  . Personal history of radiation therapy   . Thyroid disease   . Uses prosthesis    plate to cover hole in roof of mouth s/p cancer surgery, doesn't wear anymore  . Vertigo    no episodes for several years   Social History   Socioeconomic History  . Marital status: Married    Spouse name: Not on file  . Number of children: 3  . Years of  education: Not on file  . Highest education level: 12th grade  Occupational History  . Occupation: Retired  Tobacco Use  . Smoking status: Never Smoker  . Smokeless tobacco: Never Used  . Tobacco comment: Smoking cessation materials not required  Vaping Use  . Vaping Use: Never used  Substance and Sexual Activity  . Alcohol use: No  . Drug use: No  . Sexual activity: Not Currently  Other Topics Concern  . Not on file  Social History Narrative  . Not on file   Social Determinants of Health   Financial Resource Strain:   . Difficulty of Paying Living Expenses:   Food Insecurity:   . Worried About Charity fundraiser in the Last Year:   . Arboriculturist in the Last Year:   Transportation Needs:   . Film/video editor (Medical):   Marland Kitchen Lack of Transportation (Non-Medical):   Physical Activity:   . Days of Exercise per Week:   . Minutes of Exercise per Session:   Stress:   . Feeling of Stress :   Social Connections:   . Frequency of Communication with Friends and Family:   . Frequency of Social Gatherings with Friends and Family:   . Attends Religious Services:   . Active Member of Clubs or Organizations:   . Attends Archivist Meetings:   Marland Kitchen Marital Status:    Family History  Problem Relation Age of Onset  . Alzheimer's disease Mother   . Heart attack Father   . Healthy Son   . Healthy Son   . Breast cancer Neg Hx    Scheduled Meds: . acetylcysteine  4 mL Nebulization BID  . amoxicillin-clavulanate  875 mg Per Tube Q12H  . antiseptic oral rinse   Mouth Rinse QID  . Chlorhexidine Gluconate Cloth  6 each Topical Q0600  . enoxaparin (LOVENOX) injection  40 mg Subcutaneous Q24H  . feeding supplement (ENSURE ENLIVE)  237 mL Oral TID  . ketorolac  15 mg Intravenous Q8H  . levofloxacin  500 mg Oral Daily  . levothyroxine  88 mcg Oral Q0600  . melatonin  5 mg Per Tube QHS  . metoprolol tartrate  25 mg Per Tube BID  . mupirocin ointment  1 application Nasal  BID  . pantoprazole  40 mg Oral Daily  . potassium chloride  20 mEq Per Tube TID   Continuous Infusions: PRN Meds:.acetaminophen-codeine, albuterol, meclizine, metoprolol tartrate, zolpidem Medications Prior to Admission:  Prior to Admission medications   Medication Sig Start Date End Date Taking? Authorizing Provider  acetaminophen (TYLENOL) 500 MG tablet Take 1,000 mg by mouth every 6 (six) hours as needed for pain.  Yes [provider]  acetaminophen-codeine (TYLENOL #2) 300-15 MG tablet Take 1 tablet by mouth every 4 (four) hours as needed for moderate pain.   Yes [provider]  acetaminophen-codeine (TYLENOL #3) 300-30 MG tablet Take by mouth every 4 (four) hours as needed for moderate pain (Takes BID).   Yes [provider]  acetaminophen-codeine 120-12 MG/5ML solution Take 5 mLs by mouth every 6 (six) hours. 02/01/20  Yes [provider]  Acetaminophen-Codeine 300-15 MG TABS Take 1 tablet by mouth every 4 (four) hours as needed. 02/02/20  Yes [provider]  Acetylcysteine 600 MG CAPS Take 1,200 mg by mouth daily at 6 (six) AM.   Yes [provider]  albuterol (PROVENTIL HFA;VENTOLIN HFA) 108 (90 Base) MCG/ACT inhaler Inhale 2 puffs into the lungs every 6 (six) hours as needed for wheezing or shortness of breath. 07/28/18  Yes Glean Hess, MD  Artificial Saliva (BIOTENE ORALBALANCE DRY MOUTH) GEL by Transmucosal route 4 (four) times daily.  11/30/18  Yes [provider]  calcium carbonate (TUMS EX) 750 MG chewable tablet Take 300 mg of elemental by mouth every 2 (two) hours as needed for Heartburn   Yes [provider]  HYDROcodone-acetaminophen (NORCO/VICODIN) 5-325 MG tablet Take 1 tablet by mouth every 8 (eight) hours as needed. 01/29/20  Yes [provider]  levothyroxine (SYNTHROID) 75 MCG tablet Take 75 mcg by mouth daily before breakfast.   Yes [provider]  nystatin (MYCOSTATIN) 100000  UNIT/ML suspension Take 5 mLs by mouth 2 (two) times daily. 09/20/19  Yes [provider]  omeprazole (PRILOSEC) 20 MG capsule TAKE 1 CAPSULE EVERY DAY Patient taking differently: Take 20 mg by mouth daily.  09/09/19  Yes Glean Hess, MD  pregabalin (LYRICA) 50 MG capsule Take 1 capsule (50 mg total) by mouth as directed Take 1 capsule in the morning and 2 capsules at nigh 01/04/20  Yes [provider]  fexofenadine (ALLEGRA) 180 MG tablet Take 180 mg by mouth daily as needed for allergies or rhinitis.    [provider]  Calcium Carbonate-Vit D-Min (CALCIUM 1200 PO) Take by mouth daily.  08/23/19  [provider]   Allergies  Allergen Reactions  . Clavulanic Acid Diarrhea   Review of Systems  All other systems reviewed and are negative.   Physical Exam Pulmonary:     Effort: Pulmonary effort is normal.  Neurological:     Mental Status: She is alert.     Vital Signs: BP 108/66 (BP Location: Right Arm)   Pulse (!) 123   Temp 97.9 F (36.6 C) (Oral)   Resp 18   Ht 5\' 4"  (1.626 m)   Wt 45 kg   SpO2 97%   BMI 17.03 kg/m  Pain Scale: 0-10   Pain Score: 3    SpO2: SpO2: 97 % O2 Device:SpO2: 97 % O2 Flow Rate: .O2 Flow Rate (L/min): 4 L/min  IO: Intake/output summary:   Intake/Output Summary (Last 24 hours) at 02/08/2020 1531 Last data filed at 02/08/2020 0500 Gross per 24 hour  Intake 150 ml  Output --  Net 150 ml    LBM: Last BM Date: 02/07/20 Baseline Weight: Weight: 46 kg Most recent weight: Weight: 45 kg        Time In: 2:10 Time Out: 3:00 Time Total: 50 min Greater than 50%  of this time was spent counseling and coordinating care related to the above assessment and plan.  Signed by: Asencion Gowda, NP  Please contact Palliative Medicine Team phone at [phone] for questions and concerns.  For individual provider: See Shea Evans

## 2020-02-08 NOTE — Progress Notes (Signed)
Pulmonary Medicine          Date: 02/08/2020,   MRN# [id number] Crystal Kent August 23, 1945     AdmissionWeight: 46 kg                 CurrentWeight: 45 kg   Referring physician: Dr Reesa Chew   CHIEF COMPLAINT:   Recurrent aspiration pneumonia   SUBJECTIVE   Patient sitting in bed with husband at bedside.  We discussed exchange of G to J tube for nourishemnt and she is agreeable to proceed.  She is now down to 3L/min [place] and I think she should be able to tolerate procedure.    Have placed consult to IR for J tube placement.  Discussed case with [name] who agrees that this will likely help and that her small volume PO coffe/liquids is not likely playing any role in aspiration pna.   PAST MEDICAL HISTORY   Past Medical History:  Diagnosis Date  . Acid reflux   . Allergy   . 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 Difficult intubation    Pt reports "small airway"  . Feeding by G-tube ([place])    Nothing by mouth  . Hypertension   . Personal history of chemotherapy 1999  . Personal history of radiation therapy   . Thyroid disease   . Uses prosthesis    plate to cover hole in roof of mouth s/p cancer surgery, doesn't wear anymore  . Vertigo    no episodes for several years     SURGICAL HISTORY   Past Surgical History:  Procedure Laterality Date  . ABDOMINAL HYSTERECTOMY    . BREAST BIOPSY Left    bx/clip-neg  . hemimandibulectomy Right 1998  . MYRINGOTOMY WITH TUBE PLACEMENT Right 02/27/2017   Procedure: MYRINGOTOMY WITH TUBE PLACEMENT Right ear.;  Surgeon: Margaretha Sheffield, MD;  Location: [facility];  Service: ENT;  Laterality: Right;  . MYRINGOTOMY WITH TUBE PLACEMENT Right 12/09/2019   Procedure: EXAM UNDER ANESTHESIA REMOVAL OF CERUMEN;  Surgeon: Margaretha Sheffield, MD;  Location: [facility];  Service: ENT;  Laterality: Right;  . partial maxillectomy Right   . PEG PLACEMENT N/A 05/27/2017   Procedure: PERCUTANEOUS ENDOSCOPIC  GASTROSTOMY (PEG) PLACEMENT;  Surgeon: Lucilla Lame, MD;  Location: [facility];  Service: Endoscopy;  Laterality: N/A;  . PEG PLACEMENT N/A 09/07/2018   Procedure: PERCUTANEOUS ENDOSCOPIC GASTROSTOMY (PEG) PLACEMENT;  Surgeon: Lucilla Lame, MD;  Location[place];  Service: Endoscopy;  Laterality: N/A;  . RADICAL NECK DISSECTION Right 1998   multiple oral cancers     FAMILY HISTORY   Family History  Problem Relation Age of Onset  . Alzheimer's disease Mother   . Heart attack Father   . Healthy Son   . Healthy Son   . Breast cancer Neg Hx      SOCIAL HISTORY   Social History   Tobacco Use  . Smoking status: Never Smoker  . Smokeless tobacco: Never Used  . Tobacco comment: Smoking cessation materials not required  Vaping Use  . Vaping Use: Never used  Substance Use Topics  . Alcohol use: No  . Drug use: No     MEDICATIONS    Home Medication:    Current Medication:  Current Facility-Administered Medications:  .  acetaminophen-codeine 120-12 MG/5ML solution 12.5 mL, 12.5 mL, Per Tube, Q6H PRN, Norins, Heinz Knuckles, MD, 12.5 mL at 02/08/20 1430 .  acetylcysteine (MUCOMYST) 20 % nebulizer / oral solution 4  mL, 4 mL, Nebulization, BID, Lanney Gins, Naji Mehringer, MD, 4 mL at 02/08/20 7654 .  albuterol (PROVENTIL) (2.5 MG/3ML) 0.083% nebulizer solution 2.5 mg, 2.5 mg, Inhalation, Q6H PRN, Norins, Heinz Knuckles, MD, 2.5 mg at 02/08/20 6503 .  amoxicillin-clavulanate (AUGMENTIN) 400-57 MG/5ML suspension 875 mg, 875 mg, Per Tube, Q12H, Lorella Nimrod, MD, 875 mg at 02/08/20 1042 .  antiseptic oral rinse (BIOTENE) solution, , Mouth Rinse, QID, Norins, Heinz Knuckles, MD, Given at 02/08/20 1042 .  Chlorhexidine Gluconate Cloth 2 % PADS 6 each, 6 each, Topical, Q0600, Lorella Nimrod, MD .  enoxaparin (LOVENOX) injection 40 mg, 40 mg, Subcutaneous, Q24H, Norins, Heinz Knuckles, MD, 40 mg at 02/07/20 2151 .  feeding supplement (ENSURE ENLIVE) (ENSURE ENLIVE) liquid 237 mL, 237 mL, Oral, TID,  Norins, Heinz Knuckles, MD, 237 mL at 02/08/20 1721 .  ketorolac (TORADOL) 30 MG/ML injection 15 mg, 15 mg, Intravenous, Q8H, Sharion Settler, NP, 15 mg at 02/08/20 1325 .  levofloxacin (LEVAQUIN) tablet 500 mg, 500 mg, Oral, Daily, Lanney Gins, Prestyn Stanco, MD, 500 mg at 02/08/20 1040 .  levothyroxine (SYNTHROID) tablet 88 mcg, 88 mcg, Oral, Q0600, Sharion Settler, NP, 88 mcg at 02/08/20 0552 .  meclizine (ANTIVERT) tablet 12.5 mg, 12.5 mg, Per Tube, TID PRN, Norins, Heinz Knuckles, MD .  melatonin tablet 5 mg, 5 mg, Per Tube, QHS, Sharion Settler, NP, 5 mg at 02/07/20 0131 .  metoprolol tartrate (LOPRESSOR) 25 mg/10 mL oral suspension 25 mg, 25 mg, Per Tube, BID, Manuella Ghazi, Vipul, MD .  metoprolol tartrate (LOPRESSOR) injection 10 mg, 10 mg, Intravenous, Q4H PRN, Sharion Settler, NP .  mupirocin ointment (BACTROBAN) 2 % 1 application, 1 application, Nasal, BID, Lorella Nimrod, MD, 1 application at 54/65/68 1043 .  pantoprazole (PROTONIX) EC tablet 40 mg, 40 mg, Oral, Daily, Norins, Heinz Knuckles, MD, 40 mg at 02/08/20 1039 .  potassium chloride (KLOR-CON) packet 20 mEq, 20 mEq, Per Tube, TID, Sharion Settler, NP, 20 mEq at 02/08/20 1722 .  zolpidem (AMBIEN) tablet 5 mg, 5 mg, Oral, QHS PRN, Sharion Settler, NP    ALLERGIES   Clavulanic acid     REVIEW OF SYSTEMS    Review of Systems:  Gen:  Denies  fever, sweats, chills weigh loss  HEENT: Denies blurred vision, double vision, ear pain, eye pain, hearing loss, nose bleeds, sore throat Cardiac:  No dizziness, chest pain or heaviness, chest tightness,edema Resp:   Denies cough or sputum porduction, shortness of breath,wheezing, hemoptysis,  Gi: Denies swallowing difficulty, stomach pain, nausea or vomiting, diarrhea, constipation, bowel incontinence Gu:  Denies bladder incontinence, burning urine Ext:   Denies Joint pain, stiffness or swelling Skin: Denies  skin rash, easy bruising or bleeding or hives Endoc:  Denies polyuria, polydipsia , polyphagia or  weight change Psych:   Denies depression, insomnia or hallucinations   Other:  All other systems negative   VS: BP 126/63 (BP Location: Left Arm)   Pulse (!) 123   Temp 98.2 F (36.8 C) (Oral)   Resp 18   Ht 5\' 4"  (1.626 m)   Wt 45 kg   SpO2 96%   BMI 17.03 kg/m      PHYSICAL EXAM    GENERAL:NAD, no fevers, chills, no weakness no fatigue HEAD: Normocephalic, atraumatic.  EYES: Pupils equal, round, reactive to light. Extraocular muscles intact. No scleral icterus.  MOUTH: Moist mucosal membrane. Dentition intact. No abscess noted.  EAR, NOSE, THROAT: Clear without exudates. No external lesions.  NECK: Supple. No thyromegaly. No nodules. No JVD.  PULMONARY: Diffuse coarse rhonchi bilaterally CARDIOVASCULAR: S1 and S2. Regular rate and rhythm. No murmurs, rubs, or gallops. No edema. Pedal pulses 2+ bilaterally.  GASTROINTESTINAL: Soft, nontender, nondistended. No masses. Positive bowel sounds. No hepatosplenomegaly.  MUSCULOSKELETAL: No swelling, clubbing, or edema. Range of motion full in all extremities.  NEUROLOGIC: Cranial nerves II through XII are intact. No gross focal neurological deficits. Sensation intact. Reflexes intact.  SKIN: No ulceration, lesions, rashes, or cyanosis. Skin warm and dry. Turgor intact.  PSYCHIATRIC: Mood, affect within normal limits. The patient is awake, alert and oriented x 3. Insight, judgment intact.       IMAGING    CT Angio Chest PE W and/or Wo Contrast  Result Date: 02/06/2020 CLINICAL DATA:  Shortness of breath and left chest pain EXAM: CT ANGIOGRAPHY CHEST WITH CONTRAST TECHNIQUE: Multidetector CT imaging of the chest was performed using the standard protocol during bolus administration of intravenous contrast. Multiplanar CT image reconstructions and MIPs were obtained to evaluate the vascular anatomy. CONTRAST:  31mL OMNIPAQUE IOHEXOL 350 MG/ML SOLN COMPARISON:  January 05, 2020 FINDINGS: Cardiovascular: There is a optimal opacification  of the pulmonary arteries. There is no central,segmental, or subsegmental filling defects within the pulmonary arteries. There is mild cardiomegaly. No pericardial effusion or thickening. No evidence right heart strain. There is normal three-vessel brachiocephalic anatomy without proximal stenosis. Scattered aortic calcifications and coronary artery calcifications are present. Mediastinum/Nodes: Pretracheal and subcarinal adenopathy is present within the subcarinal region the largest measuring 1.5 cm in short axis diameter, likely reactive. Thyroid gland, trachea, and esophagus demonstrate no significant findings. Lungs/Pleura: Interval progression extensive multifocal patchy airspace consolidation seen throughout both lungs. There scattered areas of bronchiectasis and tree-in-bud opacities. There is also reticulonodular opacity seen predominantly at both lung bases. There is a trace right pleural effusion present. Upper Abdomen: No acute abnormalities present in the visualized portions of the upper abdomen. Musculoskeletal: No chest wall abnormality. No acute or significant osseous findings. Review of the MIP images confirms the above findings. IMPRESSION: 1.  No central, segmental, or subsegmental pulmonary embolism. 2. Interval progression in extensive multifocal airspace opacities and consolidation, consistent with multifocal pneumonia. 3. Trace right pleural effusion. 4.  Aortic Atherosclerosis (ICD10-I70.0). Electronically Signed   By: Prudencio Pair M.D.   On: 02/06/2020 18:46   DG Chest Port 1 View  Result Date: 02/07/2020 CLINICAL DATA:  74 year old female with tachycardia. EXAM: PORTABLE CHEST 1 VIEW COMPARISON:  Chest radiograph dated 02/06/2020 and CT dated 02/06/2020 FINDINGS: Confluent bilateral airspace opacities slightly worsened in the right upper lobe compared to the prior radiograph. Small bilateral pleural effusions. No pneumothorax. The cardiac silhouette is within limits. No acute osseous  pathology. IMPRESSION: Multifocal pneumonia with slight interval worsening of the right upper lobe airspace opacity compared to the prior radiograph. Continued follow-up recommended. Electronically Signed   By: Anner Crete M.D.   On: 02/07/2020 20:02   DG Chest Port 1 View  Result Date: 02/06/2020 CLINICAL DATA:  Shortness of breath, treated for chronic pneumonia EXAM: PORTABLE CHEST 1 VIEW COMPARISON:  CT 01/05/2020, radiograph 12/18/2019 FINDINGS: Increasingly coalescent airspace opacities throughout both lungs right slightly greater than left with some associated interstitial opacities as well. Findings are likely to reflect a progression of the process seen on comparison CT 01/05/2020. No pneumothorax or visible effusion. Stable cardiomediastinal contours. No acute osseous or soft tissue abnormality. Degenerative changes are present in the imaged spine and shoulders. Telemetry leads overlie the chest. IMPRESSION: Increasingly coalescent airspace opacities throughout both lungs  right slightly greater than left, may reflect progression of a multifocal infectious/inflammatory process seen on CT 01/05/2020. Electronically Signed   By: Lovena Le M.D.   On: 02/06/2020 17:24     Final report (A)  [facility]   Result 1 - [facility] Klebsiella pneumoniae (A)Comment: Heavy growth  [facility]   Result 2 - [facility] Comment Comment:  Routine respiratory flora Heavy growth  [facility]   Antimicrobial Susceptibility - [facility] Comment Comment:    ** S = Susceptible; I = Intermediate; R = Resistant **          P = Positive; N = Negative      MICS are expressed in micrograms per mL  Antibiotic         RSLT#1  RSLT#2  RSLT#3  RSLT#4 Amoxicillin/Clavulanic Acid  S Ampicillin           R Cefepime            S Ceftriaxone          S Cefuroxime           S Ciprofloxacin         S Ertapenem            S Gentamicin           S Imipenem            S Levofloxacin          S Meropenem           S Piperacillin/Tazobactam    S Tetracycline          S Tobramycin           S Trimethoprim/Sulfa       S  [facility]    Sputum Culture - [facility] (01/26/2020 10:10 AM EDT)  Specimen  Other        ASSESSMENT/PLAN   Bilateral multifocal pneumonia   - present on admission    - due to Klebsiella pneumoniae on resp cx earlier this month July 2021   - there appears to be chronic recurrent aspiration   - would like to review with IR if possible to change PEG to Jejunal feeding tube.    - continue augmentin and will add Levoquin due to persistent and refractory course   - patient has thick phlegm unable toclear despite VEST therapy - will add mucomyst 64ml BID to help thin out phlegm   -PT/OT when able   - agree with palliative care consult - have encouraged patient to participate with Palliative recommendations       Thank you for allowing me to participate in the care of this patient.   Patient/Family are satisfied with care plan and all questions have been answered.  This document was prepared using Dragon voice recognition software and may include unintentional dictation errors.     Ottie Glazier, M.D.  Division of [facility]

## 2020-02-09 ENCOUNTER — Encounter: Payer: Self-pay | Admitting: Internal Medicine

## 2020-02-09 ENCOUNTER — Inpatient Hospital Stay: Payer: Medicare HMO

## 2020-02-09 ENCOUNTER — Ambulatory Visit: Admission: RE | Admit: 2020-02-09 | Payer: Medicare HMO | Source: Ambulatory Visit

## 2020-02-09 DIAGNOSIS — R000 Tachycardia, unspecified: Secondary | ICD-10-CM

## 2020-02-09 DIAGNOSIS — T17908S Unspecified foreign body in respiratory tract, part unspecified causing other injury, sequela: Secondary | ICD-10-CM

## 2020-02-09 DIAGNOSIS — E43 Unspecified severe protein-calorie malnutrition: Secondary | ICD-10-CM | POA: Insufficient documentation

## 2020-02-09 DIAGNOSIS — E039 Hypothyroidism, unspecified: Secondary | ICD-10-CM

## 2020-02-09 HISTORY — PX: IR DUODEN/JEJUNO TUBE INSERT PERCUT W/FL MOD SEC: IMG2330

## 2020-02-09 LAB — CBC
HCT: 30 % — ABNORMAL LOW (ref 36.0–46.0)
Hemoglobin: 10.2 g/dL — ABNORMAL LOW (ref 12.0–15.0)
MCH: 31.4 pg (ref 26.0–34.0)
MCHC: 34 g/dL (ref 30.0–36.0)
MCV: 92.3 fL (ref 80.0–100.0)
Platelets: 293 10*3/uL (ref 150–400)
RBC: 3.25 MIL/uL — ABNORMAL LOW (ref 3.87–5.11)
RDW: 12.9 % (ref 11.5–15.5)
WBC: 12.1 10*3/uL — ABNORMAL HIGH (ref 4.0–10.5)
nRBC: 0 % (ref 0.0–0.2)

## 2020-02-09 LAB — BASIC METABOLIC PANEL
Anion gap: 8 (ref 5–15)
BUN: 27 mg/dL — ABNORMAL HIGH (ref 8–23)
CO2: 31 mmol/L (ref 22–32)
Calcium: 9 mg/dL (ref 8.9–10.3)
Chloride: 93 mmol/L — ABNORMAL LOW (ref 98–111)
Creatinine, Ser: 0.72 mg/dL (ref 0.44–1.00)
GFR calc Af Amer: >60 mL/min (ref >60)
GFR calc non Af Amer: >60 mL/min (ref >60)
Glucose, Bld: 132 mg/dL — ABNORMAL HIGH (ref 70–99)
Potassium: 4.1 mmol/L (ref 3.5–5.1)
Sodium: 132 mmol/L — ABNORMAL LOW (ref 135–145)

## 2020-02-09 MED ORDER — OSMOLITE 1.5 CAL PO LIQD: 1000.0000 mL | ORAL | Status: DC

## 2020-02-09 MED ORDER — LEVOFLOXACIN IN D5W 500 MG/100ML IV SOLN
500.0000 mg | INTRAVENOUS | Status: DC
  Administered 2020-02-09 – 2020-02-10 (×2): 500 mg via INTRAVENOUS
  Filled 2020-02-09 (×2): qty 100

## 2020-02-09 MED ORDER — IODIXANOL 320 MG/ML IV SOLN
50.0000 mL | Freq: Once | INTRAVENOUS | Status: AC | PRN
  Administered 2020-02-09: 14:00:00 30 mL

## 2020-02-09 MED ORDER — OSMOLITE 1.2 CAL PO LIQD: 1000.0000 mL | ORAL | Status: DC

## 2020-02-09 MED ORDER — FREE WATER
120.0000 mL | Freq: Every day | Status: DC
  Administered 2020-02-09 – 2020-02-10 (×5): 120 mL

## 2020-02-09 MED ORDER — OSMOLITE 1.5 CAL PO LIQD
1000.0000 mL | ORAL | Status: AC
  Administered 2020-02-09: 16:00:00 1000 mL

## 2020-02-09 NOTE — Progress Notes (Signed)
Pulmonary Medicine          Date: 02/09/2020,   MRN# [id number] Crystal Kent 04/30/1946     AdmissionWeight: 46 kg                 CurrentWeight: 46.2 kg   Referring physician: Dr Reesa Chew   CHIEF COMPLAINT:   Recurrent aspiration pneumonia   SUBJECTIVE   Patient sitting in bed with husband at bedside.  She had GJ tube placed and s/p RD eval with nourishment started at 51ml/h tolerating well.  Respiratory status improved with O2 weaned to 2L/min [place]. Plan to continue to wean , increase PT and optimize for d/c home.   PAST MEDICAL HISTORY   Past Medical History:  Diagnosis Date  . Acid reflux   . Allergy   . 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 Difficult intubation    Pt reports "small airway"  . Feeding by G-tube ([place])    Nothing by mouth  . Hypertension   . Personal history of chemotherapy 1999  . Personal history of radiation therapy   . Thyroid disease   . Uses prosthesis    plate to cover hole in roof of mouth s/p cancer surgery, doesn't wear anymore  . Vertigo    no episodes for several years     SURGICAL HISTORY   Past Surgical History:  Procedure Laterality Date  . ABDOMINAL HYSTERECTOMY    . BREAST BIOPSY Left    bx/clip-neg  . hemimandibulectomy Right 1998  . IR DUODEN/JEJUNO TUBE INSERT PERCUT W/FL MOD SEC  02/09/2020  . MYRINGOTOMY WITH TUBE PLACEMENT Right 02/27/2017   Procedure: MYRINGOTOMY WITH TUBE PLACEMENT Right ear.;  Surgeon: Margaretha Sheffield, MD;  Location: [facility];  Service: ENT;  Laterality: Right;  . MYRINGOTOMY WITH TUBE PLACEMENT Right 12/09/2019   Procedure: EXAM UNDER ANESTHESIA REMOVAL OF CERUMEN;  Surgeon: Margaretha Sheffield, MD;  Location: [facility];  Service: ENT;  Laterality: Right;  . partial maxillectomy Right   . PEG PLACEMENT N/A 05/27/2017   Procedure: PERCUTANEOUS ENDOSCOPIC GASTROSTOMY (PEG) PLACEMENT;  Surgeon: Lucilla Lame, MD;  Location: [facility];  Service: Endoscopy;  Laterality:  N/A;  . PEG PLACEMENT N/A 09/07/2018   Procedure: PERCUTANEOUS ENDOSCOPIC GASTROSTOMY (PEG) PLACEMENT;  Surgeon: Lucilla Lame, MD;  Location: [facility];  Service: Endoscopy;  Laterality: N/A;  . RADICAL NECK DISSECTION Right 1998   multiple oral cancers     FAMILY HISTORY   Family History  Problem Relation Age of Onset  . Alzheimer's disease Mother   . Heart attack Father   . Healthy Son   . Healthy Son   . Breast cancer Neg Hx      SOCIAL HISTORY   Social History   Tobacco Use  . Smoking status: Never Smoker  . Smokeless tobacco: Never Used  . Tobacco comment: Smoking cessation materials not required  Vaping Use  . Vaping Use: Never used  Substance Use Topics  . Alcohol use: No  . Drug use: No     MEDICATIONS    Home Medication:    Current Medication:  Current Facility-Administered Medications:  .  acetaminophen-codeine 120-12 MG/5ML solution 12.5 mL, 12.5 mL, Per Tube, Q6H PRN, Norins, Heinz Knuckles, MD, 12.5 mL at 02/09/20 1421 .  acetylcysteine (MUCOMYST) 20 % nebulizer / oral solution 4 mL, 4 mL, Nebulization, BID, Lanney Gins, Brittinie Wherley, MD, 4 mL at 02/09/20 0801 .  albuterol (PROVENTIL) (2.5 MG/3ML) 0.083% nebulizer solution 2.5 mg,  2.5 mg, Inhalation, Q6H PRN, Neena Rhymes, MD, 2.5 mg at 02/09/20 0801 .  amoxicillin-clavulanate (AUGMENTIN) 400-57 MG/5ML suspension 875 mg, 875 mg, Per Tube, Q12H, Lorella Nimrod, MD, 875 mg at 02/09/20 0851 .  antiseptic oral rinse (BIOTENE) solution, , Mouth Rinse, QID, Norins, Heinz Knuckles, MD, Given at 02/09/20 1741 .  Chlorhexidine Gluconate Cloth 2 % PADS 6 each, 6 each, Topical, Q0600, Lorella Nimrod, MD, 6 each at 02/09/20 1000 .  enoxaparin (LOVENOX) injection 40 mg, 40 mg, Subcutaneous, Q24H, Norins, Heinz Knuckles, MD, 40 mg at 02/08/20 2203 .  [START ON 02/10/2020] feeding supplement (OSMOLITE 1.5 CAL) liquid 1,000 mL, 1,000 mL, Per Tube, Q24H, Amin, Sumayya, MD .  free water 120 mL, 120 mL, Per Tube, 6 X Daily, Lorella Nimrod, MD, 120 mL at 02/09/20 1741 .  ketorolac (TORADOL) 30 MG/ML injection 15 mg, 15 mg, Intravenous, Q8H, Sharion Settler, NP, 15 mg at 02/09/20 1555 .  levofloxacin (LEVAQUIN) IVPB 500 mg, 500 mg, Intravenous, Q24H, Amin, Soundra Pilon, MD, Last Rate: 100 mL/hr at 02/09/20 1736, 500 mg at 02/09/20 1736 .  levothyroxine (SYNTHROID) tablet 88 mcg, 88 mcg, Oral, Q0600, Sharion Settler, NP, 88 mcg at 02/09/20 0520 .  meclizine (ANTIVERT) tablet 12.5 mg, 12.5 mg, Per Tube, TID PRN, Norins, Heinz Knuckles, MD .  melatonin tablet 5 mg, 5 mg, Per Tube, QHS, Sharion Settler, NP, 5 mg at 02/08/20 2203 .  metoprolol tartrate (LOPRESSOR) 25 mg/10 mL oral suspension 25 mg, 25 mg, Per Tube, BID, Max Sane, MD, 25 mg at 02/09/20 0845 .  metoprolol tartrate (LOPRESSOR) injection 10 mg, 10 mg, Intravenous, Q4H PRN, Sharion Settler, NP, 10 mg at 02/08/20 2144 .  mupirocin ointment (BACTROBAN) 2 % 1 application, 1 application, Nasal, BID, Lorella Nimrod, MD, 1 application at 21/22/48 0856 .  pantoprazole (PROTONIX) EC tablet 40 mg, 40 mg, Oral, Daily, Norins, Heinz Knuckles, MD, 40 mg at 02/09/20 0844 .  potassium chloride (KLOR-CON) packet 20 mEq, 20 mEq, Per Tube, TID, Sharion Settler, NP, 20 mEq at 02/09/20 1557 .  zolpidem (AMBIEN) tablet 5 mg, 5 mg, Oral, QHS PRN, Sharion Settler, NP    ALLERGIES   Clavulanic acid     REVIEW OF SYSTEMS    Review of Systems:  Gen:  Denies  fever, sweats, chills weigh loss  HEENT: Denies blurred vision, double vision, ear pain, eye pain, hearing loss, nose bleeds, sore throat Cardiac:  No dizziness, chest pain or heaviness, chest tightness,edema Resp:   Denies cough or sputum porduction, shortness of breath,wheezing, hemoptysis,  Gi: Denies swallowing difficulty, stomach pain, nausea or vomiting, diarrhea, constipation, bowel incontinence Gu:  Denies bladder incontinence, burning urine Ext:   Denies Joint pain, stiffness or swelling Skin: Denies  skin rash, easy  bruising or bleeding or hives Endoc:  Denies polyuria, polydipsia , polyphagia or weight change Psych:   Denies depression, insomnia or hallucinations   Other:  All other systems negative   VS: BP 111/68 (BP Location: Left Arm)   Pulse (!) 104   Temp 97.8 F (36.6 C) (Oral)   Resp 16   Ht 5\' 4"  (1.626 m)   Wt 46.2 kg   SpO2 95%   BMI 17.48 kg/m      PHYSICAL EXAM    GENERAL:NAD, no fevers, chills, no weakness no fatigue HEAD: Normocephalic, atraumatic.  EYES: Pupils equal, round, reactive to light. Extraocular muscles intact. No scleral icterus.  MOUTH: Moist mucosal membrane. Dentition intact. No abscess noted.  EAR, NOSE, THROAT:  Clear without exudates. No external lesions.  NECK: Supple. No thyromegaly. No nodules. No JVD.  PULMONARY: mild rhonchi bilaterally  CARDIOVASCULAR: S1 and S2. Regular rate and rhythm. No murmurs, rubs, or gallops. No edema. Pedal pulses 2+ bilaterally.  GASTROINTESTINAL: Soft, nontender, nondistended. No masses. Positive bowel sounds. No hepatosplenomegaly.  MUSCULOSKELETAL: No swelling, clubbing, or edema. Range of motion full in all extremities.  NEUROLOGIC: Cranial nerves II through XII are intact. No gross focal neurological deficits. Sensation intact. Reflexes intact.  SKIN: No ulceration, lesions, rashes, or cyanosis. Skin warm and dry. Turgor intact.  PSYCHIATRIC: Mood, affect within normal limits. The patient is awake, alert and oriented x 3. Insight, judgment intact.       IMAGING    CT Angio Chest PE W and/or Wo Contrast  Result Date: 02/06/2020 CLINICAL DATA:  Shortness of breath and left chest pain EXAM: CT ANGIOGRAPHY CHEST WITH CONTRAST TECHNIQUE: Multidetector CT imaging of the chest was performed using the standard protocol during bolus administration of intravenous contrast. Multiplanar CT image reconstructions and MIPs were obtained to evaluate the vascular anatomy. CONTRAST:  62mL OMNIPAQUE IOHEXOL 350 MG/ML SOLN  COMPARISON:  January 05, 2020 FINDINGS: Cardiovascular: There is a optimal opacification of the pulmonary arteries. There is no central,segmental, or subsegmental filling defects within the pulmonary arteries. There is mild cardiomegaly. No pericardial effusion or thickening. No evidence right heart strain. There is normal three-vessel brachiocephalic anatomy without proximal stenosis. Scattered aortic calcifications and coronary artery calcifications are present. Mediastinum/Nodes: Pretracheal and subcarinal adenopathy is present within the subcarinal region the largest measuring 1.5 cm in short axis diameter, likely reactive. Thyroid gland, trachea, and esophagus demonstrate no significant findings. Lungs/Pleura: Interval progression extensive multifocal patchy airspace consolidation seen throughout both lungs. There scattered areas of bronchiectasis and tree-in-bud opacities. There is also reticulonodular opacity seen predominantly at both lung bases. There is a trace right pleural effusion present. Upper Abdomen: No acute abnormalities present in the visualized portions of the upper abdomen. Musculoskeletal: No chest wall abnormality. No acute or significant osseous findings. Review of the MIP images confirms the above findings. IMPRESSION: 1.  No central, segmental, or subsegmental pulmonary embolism. 2. Interval progression in extensive multifocal airspace opacities and consolidation, consistent with multifocal pneumonia. 3. Trace right pleural effusion. 4.  Aortic Atherosclerosis (ICD10-I70.0). Electronically Signed   By: Prudencio Pair M.D.   On: 02/06/2020 18:46   IR DUODEN/JEJUNO TUBE INSERT PERCUT W/FL MOD SEC  Result Date: 02/09/2020 INDICATION: 74 year old female referred for placement of a gastrojejunostomy tube EXAM: PERC PLACEMENT JEJUNAL TUBE MEDICATIONS: None ANESTHESIA/SEDATION: None CONTRAST:  60mL VISIPAQUE IODIXANOL 320 MG/ML IV SOLN - administered into the gastric lumen. FLUOROSCOPY TIME:   Fluoroscopy Time: 8 minutes 24 seconds (56 mGy). COMPLICATIONS: None PROCEDURE: Informed written consent was obtained from the patient and the patient's family after a thorough discussion of the procedural risks, benefits and alternatives. All questions were addressed. Maximal Sterile Barrier Technique was utilized including caps, mask, sterile gowns, sterile gloves, sterile drape, hand hygiene and skin antiseptic. A timeout was performed prior to the initiation of the procedure. The epigastrium was prepped with Betadine in a sterile fashion, and a sterile drape was applied covering the operative field. A sterile gown and sterile gloves were used for the procedure. The indwelling low-profile gastrostomy tube was removed. A 45 cm Kumpe the catheter was navigated using contrast and a Glidewire through the pylorus into the proximal duodenum. Small amount of contrast and gas were administered through the Kumpe the catheter,  opacifying the first portion of the duodenum. Glidewire and the Kumpe the catheter were used to navigate into the third portion the duodenum. The vector of approach for the percutaneous gastrostomy was significantly cephalad. This caused a large loop on the wire and catheter during advancing the Kumpe the catheter into the duodenum. Thus the 45 cm Kumpe the catheter was hubbed in the first portion of the duodenum. The Glidewire was advanced as distal into the duodenum as possible and the 45 cm Kumpe the catheter was removed. We then passed a 4 French glide cath 65 cm over the Glidewire, in an attempt to reduce the loop and advanced the glide catheter into the duodenum. With a reasonable reduction in the loop in the stomach the glide cath was advanced distally. With a distal position, the Glidewire was removed and a rose in wire was placed for traction. Once the wire was in the third portion the duodenum, the catheter was removed. A new 18 French gastrojejunostomy then was passed over the [facility] an  wire. Wire was removed. Contrast was injected and a final image was stored. Approximately 7 cc of saline used to inflate the balloon. Patient tolerated the procedure well and remained hemodynamically stable throughout. No complications were encountered and no significant blood loss encountered. IMPRESSION: Status post fluoroscopic placed percutaneous gastrojejunostomy tube, with an 81 French balloon retention tube. Signed, Dulcy Fanny. Earleen Newport, DO [facility] Rad[facility] Electronically Signed   By: Corrie Mckusick D.O.   On: 02/09/2020 14:21   DG Chest Port 1 View  Result Date: 02/07/2020 CLINICAL DATA:  74 year old female with tachycardia. EXAM: PORTABLE CHEST 1 VIEW COMPARISON:  Chest radiograph dated 02/06/2020 and CT dated 02/06/2020 FINDINGS: Confluent bilateral airspace opacities slightly worsened in the right upper lobe compared to the prior radiograph. Small bilateral pleural effusions. No pneumothorax. The cardiac silhouette is within limits. No acute osseous pathology. IMPRESSION: Multifocal pneumonia with slight interval worsening of the right upper lobe airspace opacity compared to the prior radiograph. Continued follow-up recommended. Electronically Signed   By: Anner Crete M.D.   On: 02/07/2020 20:02   DG Chest Port 1 View  Result Date: 02/06/2020 CLINICAL DATA:  Shortness of breath, treated for chronic pneumonia EXAM: PORTABLE CHEST 1 VIEW COMPARISON:  CT 01/05/2020, radiograph 12/18/2019 FINDINGS: Increasingly coalescent airspace opacities throughout both lungs right slightly greater than left with some associated interstitial opacities as well. Findings are likely to reflect a progression of the process seen on comparison CT 01/05/2020. No pneumothorax or visible effusion. Stable cardiomediastinal contours. No acute osseous or soft tissue abnormality. Degenerative changes are present in the imaged spine and shoulders. Telemetry leads overlie the  chest. IMPRESSION: Increasingly coalescent airspace opacities throughout both lungs right slightly greater than left, may reflect progression of a multifocal infectious/inflammatory process seen on CT 01/05/2020. Electronically Signed   By: Lovena Le M.D.   On: 02/06/2020 17:24     Final report (A)  [facility]   Result 1 - [facility] Klebsiella pneumoniae (A)Comment: Heavy growth  [facility]   Result 2 - [facility] Comment Comment:  Routine respiratory flora Heavy growth  [facility]   Antimicrobial Susceptibility - [facility] Comment Comment:    ** S = Susceptible; I = Intermediate; R = Resistant **          P = Positive; N = Negative      MICS are expressed in micrograms per mL  Antibiotic  RSLT#1  RSLT#2  RSLT#3  RSLT#4 Amoxicillin/Clavulanic Acid  S Ampicillin           R Cefepime            S Ceftriaxone          S Cefuroxime           S Ciprofloxacin         S Ertapenem           S Gentamicin           S Imipenem            S Levofloxacin          S Meropenem           S Piperacillin/Tazobactam    S Tetracycline          S Tobramycin           S Trimethoprim/Sulfa       S  [facility]    Sputum Culture - [facility] (01/26/2020 10:10 AM EDT)  Specimen  Other        ASSESSMENT/PLAN   Bilateral multifocal pneumonia   - present on admission    - due to Klebsiella pneumoniae on resp cx earlier this month July 2021   - there appears to be chronic recurrent aspiration  -repeat CXR today    - continue augmentin and will add Levoquin due to persistent and refractory course   - patient has thick phlegm unable toclear despite VEST therapy - will add mucomyst 77ml BID to help thin out phlegm   -PT/OT when able   - agree with palliative care consult - have encouraged patient to  participate with Palliative recommendations  -s/p GJ tube placement due to recurrent high volume aspiration      Thank you for allowing me to participate in the care of this patient.   Patient/Family are satisfied with care plan and all questions have been answered.  This document was prepared using Dragon voice recognition software and may include unintentional dictation errors.     Ottie Glazier, M.D.  Division of [place]

## 2020-02-09 NOTE — Progress Notes (Signed)
Pt off unit 1c at this time  In interventional radiology  For  Peg exchange.

## 2020-02-09 NOTE — Progress Notes (Signed)
Pt back from  Rad from having peg  Tube exchanged.  Pt having pain. Med for pain given. Will cont to moniter  And recheck  Hr  After pt settles down.

## 2020-02-09 NOTE — Care Management Important Message (Signed)
Important Message  Patient Details  Name: Crystal Kent MRN: [id number] Date of Birth: [birth year]   Medicare Important Message Given:  Yes     Dannette Barbara 02/09/2020, 11:47 AM

## 2020-02-09 NOTE — Progress Notes (Signed)
Patient tolerated svn and cpt well earlier in shift. Wanted to sleep during the night. Will resume respiratory therapies in the am

## 2020-02-09 NOTE — Procedures (Signed)
Interventional Radiology Procedure Note  Procedure: Image guided exchange of percutaneous gastrstomy, for a percutaneous gastro-jejunostomy. 56P  Complications: None  Recommendations:  - OK to use   - Routine  care   Signed,  Dulcy Fanny. Earleen Newport, DO

## 2020-02-09 NOTE — Progress Notes (Signed)
PROGRESS NOTE    Crystal Kent  KZL:[id number] DOB: [birth year] DOA: 02/06/2020 PCP: Barbaraann Boys, MD   Brief Narrative:  Crystal Kent is a 74 y.o. 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 She has been followed as an outpatient for multi-focal pneumonia with CT chest 01/05/20 revealing wide-sp0read ground glass opacity, tree-in-bud centric lobular, mild cylindrical bronchectiasis that radiology v=favored for recurrent aspiration. She has had outpatient antibiotics - Augmentin. For increased SOB and chest pain she presented to Urgent Care on the day of admission where she was found to have pneumonia with hypoxemia to O2 sat of 77% on RA. She was referred to [facility] for further evaluation.  On arrival she was hypoxic requiring 4 L. Had procalcitonin positive at 0.87.  CTA negative for PE but revealed multifocal groundglass opacities, tree-in-bud findings.  Initially code sepsis was called and she was started on cefepime and vancomycin.  Sepsis ruled out.  Antibiotics changed to Augmentin per PEG tube.  Subjective: Patient has no new complaint today.  Husband was at bedside.  Waiting for J-tube placement.  She was feeling hungry.  Assessment & Plan:   Active Problems:   Dysphagia, oropharyngeal   Essential hypertension   Hypothyroidism   Multifocal pneumonia   Chronic pain after cancer treatment   Protein-calorie malnutrition, severe  Chronic aspiration pneumonia.  Has chronic aspiration secondary to her surgeries for oropharyngeal cancer.  She followed up with pulmonary.  Recent sputum culture done earlier this month grew Klebsiella with good sensitivity except ampicillin.  She does do some suctioning at home. No home oxygen use.  Currently on 4 L. CT chest with similar findings. Dr. Laural Roes following  She was started on azithromycin and cefepime on admission. Patient also has a very weak  cough and unable to clear her secretions.  Looks like patient and her husband does not have whole insight of her disease. -Switched from cefepime and azithromycin to Augmentin through PEG tube on 7/19.  Pulmonary added Levaquin on 7/19 due to refractory course.  Also added Mucomyst to help thin out secretions -Swallow evaluation with oropharyngeal dysphagia and they are recommending continuation of pleasure sips of coffee. -Continue chest PT. -Waiting for J-tube placement-she will need pump for feeding afterwards. -Might need oxygen for home-we will try weaning her off if unsuccessful then we will evaluate her for home oxygen needs-currently on 2 L -Palliative care consult -commending palliative on discharge. -PT evaluation for home health needs  Chronic pain after cancer treatment.  Patient has tried Tylenol 3 with some relief.  Prescription was not renewed.  She was also given a trial of gabapentin teen and Lyrica and unable to tolerate it. -She was started on Tylenol 3 through PEG tube. -She needs to follow-up with her pain clinic for further recommendations.  Hypertension -blood pressure mildly elevated.  Stopping amlodipine on 7/20-can be restarted if needed -increase metoprolol to 25 mg twice daily via PEG tube to help with her sinus tachycardia.  Hypothyroidism. -TSH was elevated at 13.56 on 7/19, Synthroid dose increased to 88 mcg on 7/19.  Objective: Vitals:   02/09/20 1340 02/09/20 1345 02/09/20 1509 02/09/20 1521  BP: (!) 159/94   111/68  Pulse: (!) 126 (!) 117  (!) 104  Resp: (!) 21 (!) 24 20 16   Temp:    97.8 F (36.6 C)  TempSrc:    Oral  SpO2: 95%   95%  Weight:  Height:        Intake/Output Summary (Last 24 hours) at 02/09/2020 1759 Last data filed at 02/09/2020 1741 Gross per 24 hour  Intake 247 ml  Output --  Net 247 ml   Filed Weights   02/06/20 1633 02/08/20 0500 02/09/20 0500  Weight: 46 kg 45 kg 46.2 kg    Examination:  General.  Frail,  emaciated elderly lady, in no acute distress. Pulmonary.  Few scattered rhonchi bilaterally, normal respiratory effort. CV.  Regular rate and rhythm, no JVD, rub or murmur. Abdomen.  Soft, nontender, nondistended, BS positive. CNS.  Alert and oriented x3.  No focal neurologic deficit. Extremities.  No edema, no cyanosis, pulses intact and symmetrical. Psychiatry.  Judgment and insight appears normal.  DVT prophylaxis: Lovenox Code Status: DNR Family Communication: Husband was updated at bedside. Disposition Plan:  Status is: Inpatient  Remains inpatient appropriate because:Inpatient level of care appropriate due to severity of illness   Dispo: The patient is from: Home              Anticipated d/c is to: Home              Anticipated d/c date is: 1 day              Patient currently is not medically stable to d/c.  Patient got her J-tube today.  Will need training and pump for discharge.  Consultants:   Pulmonary  Palliative care  Procedures:  Antimicrobials:  Augmentin  Data Reviewed: I have personally reviewed following labs and imaging studies  CBC: Recent Labs  Lab 02/06/20 1636 02/08/20 0412 02/09/20 0413  WBC 11.9* 17.2* 12.1*  NEUTROABS 10.3*  --   --   HGB 10.2* 12.1 10.2*  HCT 31.5* 35.8* 30.0*  MCV 95.5 92.0 92.3  PLT 269 328 235   Basic Metabolic Panel: Recent Labs  Lab 02/06/20 1726 02/07/20 2008 02/08/20 0412 02/09/20 0413  NA 130* 133* 135 132*  K 3.8 3.4* 3.6 4.1  CL 95* 91* 92* 93*  CO2 25 28 30 31   GLUCOSE 110* 125* 116* 132*  BUN 20 21 27* 27*  CREATININE 0.82 0.73 0.70 0.72  CALCIUM 8.9 9.3 9.2 9.0  MG  --  1.9  --   --    GFR: Estimated Creatinine Clearance: 44.3 mL/min (by C-G formula based on SCr of 0.72 mg/dL). Liver Function Tests: Recent Labs  Lab 02/06/20 1726  AST 30  ALT 23  ALKPHOS 110  BILITOT 1.2  PROT 7.4  ALBUMIN 3.5   Recent Labs  Lab 02/06/20 1726  LIPASE 22   No results for input(s): AMMONIA in the  last 168 hours. Coagulation Profile: Recent Labs  Lab 02/06/20 1636  INR 1.1   Cardiac Enzymes: No results for input(s): CKTOTAL, CKMB, CKMBINDEX, TROPONINI in the last 168 hours. BNP (last 3 results) No results for input(s): PROBNP in the last 8760 hours. HbA1C: No results for input(s): HGBA1C in the last 72 hours. CBG: Recent Labs  Lab 02/07/20 2108  GLUCAP 141*   Lipid Profile: No results for input(s): CHOL, HDL, LDLCALC, TRIG, CHOLHDL, LDLDIRECT in the last 72 hours. Thyroid Function Tests: Recent Labs    02/07/20 2008 02/08/20 0412  TSH 13.565*  --   FREET4  --  0.97   Anemia Panel: No results for input(s): VITAMINB12, FOLATE, FERRITIN, TIBC, IRON, RETICCTPCT in the last 72 hours. Sepsis Labs: Recent Labs  Lab 02/06/20 1636 02/06/20 1726  PROCALCITON  --  0.87  LATICACIDVEN 1.9  --     Recent Results (from the past 240 hour(s))  Blood Culture (routine x 2)     Status: None (Preliminary result)   Collection Time: 02/06/20  4:36 PM   Specimen: BLOOD  Result Value Ref Range Status   Specimen Description BLOOD RAC  Final   Special Requests   Final    BOTTLES DRAWN AEROBIC AND ANAEROBIC Blood Culture adequate volume   Culture   Final    NO GROWTH 3 DAYS Performed at [facility], [address], [place], [place] [address]    Report Status PENDING  Incomplete  Blood Culture (routine x 2)     Status: None (Preliminary result)   Collection Time: 02/06/20  4:36 PM   Specimen: BLOOD  Result Value Ref Range Status   Specimen Description BLOOD LAC  Final   Special Requests   Final    BOTTLES DRAWN AEROBIC AND ANAEROBIC Blood Culture adequate volume   Culture   Final    NO GROWTH 3 DAYS Performed at [facility], [address], [place], [place] [address]    Report Status PENDING  Incomplete  Urine culture     Status: Abnormal (Preliminary result)   Collection Time: 02/06/20  4:36 PM   Specimen: In/Out Cath Urine  Result Value Ref Range  Status   Specimen Description   Final    IN/OUT CATH URINE Performed at [facility], [address], [place], [place] [address]    Special Requests   Final    NONE Performed at [facility], [address], [place], [place] [address]    Culture (A)  Final    100 COLONIES/mL STAPHYLOCOCCUS AUREUS SUSCEPTIBILITIES TO FOLLOW Performed at [facility], [place] [address], [place], [place] [address]    Report Status PENDING  Incomplete  SARS Coronavirus 2 by RT PCR (hospital order, performed in [facility] hospital lab) Nasopharyngeal Nasopharyngeal Swab     Status: None   Collection Time: 02/06/20  6:41 PM   Specimen: Nasopharyngeal Swab  Result Value Ref Range Status   SARS Coronavirus 2 NEGATIVE NEGATIVE Final    Comment: (NOTE) SARS-CoV-2 target nucleic acids are NOT DETECTED.  The SARS-CoV-2 RNA is generally detectable in upper and lower respiratory specimens during the acute phase of infection. The lowest concentration of SARS-CoV-2 viral copies this assay can detect is 250 copies / mL. A negative result does not preclude SARS-CoV-2 infection and should not be used as the sole basis for treatment or other patient management decisions.  A negative result may occur with improper specimen collection / handling, submission of specimen other than nasopharyngeal swab, presence of viral mutation(s) within the areas targeted by this assay, and inadequate number of viral copies (<250 copies / mL). A negative result must be combined with clinical observations, patient history, and epidemiological information.  Fact Sheet for Patients:   StrictlyIdeas.no  Fact Sheet for Healthcare Providers: BankingDealers.co.za  This test is not yet approved or  cleared by the [place] FDA and has been authorized for detection and/or diagnosis of SARS-CoV-2 by FDA under an Emergency Use Authorization (EUA).  This EUA will  remain in effect (meaning this test can be used) for the duration of the COVID-19 declaration under Section 564(b)(1) of the Act, 21 U.S.C. section 360bbb-3(b)(1), unless the authorization is terminated or revoked sooner.  Performed at [facility], [address] [place],  [address]   Expectorated sputum assessment w  rflx to resp cult     Status: None   Collection Time: 02/07/20  5:30 PM   Specimen: Sputum  Result Value Ref Range Status   Specimen Description SPUTUM  Final   Special Requests NONE  Final   Sputum evaluation   Final    THIS SPECIMEN IS ACCEPTABLE FOR SPUTUM CULTURE Performed at [facility], [address] [place], [place] [address]    Report Status 02/08/2020 FINAL  Final  MRSA PCR Screening     Status: Abnormal   Collection Time: 02/07/20  5:30 PM   Specimen: Nasopharyngeal  Result Value Ref Range Status   MRSA by PCR POSITIVE (A) NEGATIVE Final    Comment:        The GeneXpert MRSA Assay (FDA approved for NASAL specimens only), is one component of a comprehensive MRSA colonization surveillance program. It is not intended to diagnose MRSA infection nor to guide or monitor treatment for MRSA infections. RESULT CALLED TO, READ BACK BY AND VERIFIED WITH: WANETTE MILES @1913  ON 02/07/20 SKL Performed at [facility], [address] [place], [place] [address]   Respiratory Panel by PCR     Status: None   Collection Time: 02/07/20  5:30 PM   Specimen: Nasopharyngeal Swab; Respiratory  Result Value Ref Range Status   Adenovirus NOT DETECTED NOT DETECTED Final   Coronavirus 229E NOT DETECTED NOT DETECTED Final    Comment: (NOTE) The Coronavirus on the Respiratory Panel, DOES NOT test for the novel  Coronavirus (2019 nCoV)    Coronavirus HKU1 NOT DETECTED NOT DETECTED Final   Coronavirus NL63 NOT DETECTED NOT DETECTED Final   Coronavirus OC43 NOT DETECTED NOT DETECTED Final   Metapneumovirus NOT DETECTED NOT DETECTED  Final   Rhinovirus / Enterovirus NOT DETECTED NOT DETECTED Final   Influenza A NOT DETECTED NOT DETECTED Final   Influenza B NOT DETECTED NOT DETECTED Final   Parainfluenza Virus 1 NOT DETECTED NOT DETECTED Final   Parainfluenza Virus 2 NOT DETECTED NOT DETECTED Final   Parainfluenza Virus 3 NOT DETECTED NOT DETECTED Final   Parainfluenza Virus 4 NOT DETECTED NOT DETECTED Final   Respiratory Syncytial Virus NOT DETECTED NOT DETECTED Final   Bordetella pertussis NOT DETECTED NOT DETECTED Final   Chlamydophila pneumoniae NOT DETECTED NOT DETECTED Final   Mycoplasma pneumoniae NOT DETECTED NOT DETECTED Final    Comment: Performed at [facility], [place]. [address] [place], [place] [address]  Culture, respiratory     Status: None (Preliminary result)   Collection Time: 02/07/20  5:30 PM   Specimen: SPU  Result Value Ref Range Status   Specimen Description   Final    SPUTUM Performed at [facility], [address] [place], [place] [address]    Special Requests   Final    NONE Reflexed from [id number] Performed at [facility], [address] [place], [place] [address]    Gram Stain   Final    FEW WBC PRESENT, PREDOMINANTLY PMN MODERATE GRAM POSITIVE COCCI IN PAIRS IN CHAINS FEW YEAST    Culture   Final    CULTURE REINCUBATED FOR BETTER GROWTH Performed at [facility], [place] [address] [place], [place] [address]    Report Status PENDING  Incomplete     Radiology Studies: IR DUODEN/JEJUNO TUBE INSERT PERCUT W/FL MOD SEC  Result Date: 02/09/2020 INDICATION: 74 year old female referred for placement of a gastrojejunostomy tube EXAM: PERC PLACEMENT JEJUNAL TUBE MEDICATIONS: None ANESTHESIA/SEDATION: None CONTRAST:  17mL VISIPAQUE IODIXANOL 320 MG/ML IV SOLN - administered into the gastric lumen. FLUOROSCOPY TIME:  Fluoroscopy Time: 8 minutes 24 seconds (56 mGy). COMPLICATIONS: None PROCEDURE: Informed written consent was obtained from the patient and the  patient's family after a thorough discussion of the procedural risks, benefits and alternatives. All questions were addressed. Maximal Sterile Barrier Technique was utilized including caps, mask, sterile gowns, sterile gloves, sterile drape, hand hygiene and skin antiseptic. A timeout was performed prior to the initiation of the procedure. The epigastrium was prepped with Betadine in a sterile fashion, and a sterile drape was applied covering the operative field. A sterile gown and sterile gloves were used for the procedure. The indwelling low-profile gastrostomy tube was removed. A 45 cm Kumpe the catheter was navigated using contrast and a Glidewire through the pylorus into the proximal duodenum. Small amount of contrast and gas were administered through the Kumpe the catheter, opacifying the first portion of the duodenum. Glidewire and the Kumpe the catheter were used to navigate into the third portion the duodenum. The vector of approach for the percutaneous gastrostomy was significantly cephalad. This caused a large loop on the wire and catheter during advancing the Kumpe the catheter into the duodenum. Thus the 45 cm Kumpe the catheter was hubbed in the first portion of the duodenum. The Glidewire was advanced as distal into the duodenum as possible and the 45 cm Kumpe the catheter was removed. We then passed a 4 French glide cath 65 cm over the Glidewire, in an attempt to reduce the loop and advanced the glide catheter into the duodenum. With a reasonable reduction in the loop in the stomach the glide cath was advanced distally. With a distal position, the Glidewire was removed and a rose in wire was placed for traction. Once the wire was in the third portion the duodenum, the catheter was removed. A new 18 French gastrojejunostomy then was passed over the [facility] an wire. Wire was removed. Contrast was injected and a final image was stored. Approximately 7 cc of saline used to inflate the balloon. Patient  tolerated the procedure well and remained hemodynamically stable throughout. No complications were encountered and no significant blood loss encountered. IMPRESSION: Status post fluoroscopic placed percutaneous gastrojejunostomy tube, with an 54 French balloon retention tube. Signed, Dulcy Fanny. Earleen Newport, DO [facility] Radiology Electronically Signed   By: Corrie Mckusick D.O.   On: 02/09/2020 14:21   DG Chest Port 1 View  Result Date: 02/07/2020 CLINICAL DATA:  74 year old female with tachycardia. EXAM: PORTABLE CHEST 1 VIEW COMPARISON:  Chest radiograph dated 02/06/2020 and CT dated 02/06/2020 FINDINGS: Confluent bilateral airspace opacities slightly worsened in the right upper lobe compared to the prior radiograph. Small bilateral pleural effusions. No pneumothorax. The cardiac silhouette is within limits. No acute osseous pathology. IMPRESSION: Multifocal pneumonia with slight interval worsening of the right upper lobe airspace opacity compared to the prior radiograph. Continued follow-up recommended. Electronically Signed   By: Anner Crete M.D.   On: 02/07/2020 20:02    Scheduled Meds: . acetylcysteine  4 mL Nebulization BID  . amoxicillin-clavulanate  875 mg Per Tube Q12H  . antiseptic oral rinse   Mouth Rinse QID  . Chlorhexidine Gluconate Cloth  6 each Topical Q0600  . enoxaparin (LOVENOX) injection  40 mg Subcutaneous Q24H  . [START ON 02/10/2020] feeding supplement (OSMOLITE 1.5 CAL)  1,000 mL Per Tube Q24H  . free water  120 mL Per Tube 6 X Daily  .  ketorolac  15 mg Intravenous Q8H  . levothyroxine  88 mcg Oral Q0600  . melatonin  5 mg Per Tube QHS  . metoprolol tartrate  25 mg Per Tube BID  . mupirocin ointment  1 application Nasal BID  . pantoprazole  40 mg Oral Daily  . potassium chloride  20 mEq Per Tube TID   Continuous Infusions: . levofloxacin (LEVAQUIN) IV 500 mg (02/09/20 1736)     LOS: 3 days   Time spent: 35 minutes.  I  personally reviewed her chart and images.  More than 50% of that time was spent in direct patient care and discussion with other providers.  Lorella Nimrod, MD [facility]  If 7PM-7AM, please contact night-coverage [email or web address]  02/09/2020, 5:59 PM   This record has been created using Systems analyst. Errors have been sought and corrected,but may not always be located. Such creation errors do not reflect on the standard of care.

## 2020-02-09 NOTE — Progress Notes (Addendum)
Initial Nutrition Assessment  DOCUMENTATION CODES:   Severe malnutrition in context of chronic illness, Underweight  INTERVENTION:  Addendum: Patient now s/p exchange of G-tube for 18 Fr. G-J tube by IR. Per note tube is okay to use.  Recommend advancing towards new goal daytime tube feed regimen: Day 1: Initiate Osmolite 1.5 Cal at 45 mL/hr x 12 hrs (0800-2000) via jejunal port of G-J tube. Day 2: Advance to goal of Osmolite 1.5 Cal at 90 mL/hr x 12 hrs (0800-2000) via jejunal port of G-J tube, which is 1080 mL daily. Provides 1620 kcal, 68 grams of protein, 821 mL H2O daily.  Also provide free water flush of 120 mL 6 times daily during daytime hours patient is on feeding pump. This provides a total of 1541 mL H2O daily including water in tube feeding regimen.  Monitor magnesium, potassium, and phosphorus daily for at least 3 days, MD to replete as needed, as pt is at risk for refeeding syndrome given severe malnutrition.  NUTRITION DIAGNOSIS:   Severe Malnutrition related to chronic illness (hx adenocarcinoma of right pharynx and recurrent SCC of right palate, inadequate enteral nutrition infusion due to intolerance/regurgitation) as evidenced by severe fat depletion, severe muscle depletion.  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   Met with patient, her husband, and her sister at bedside. Patient only able to speak in whispers so most of history is  provided by patient's husband and sister and patient would participate in conversation by nodding. Patient has had a G-tube for approximately 22 years now and has always done bolus feeding. Patient is NPO except for small pleasure sips of coffee. She currently has a Mic-[employer] low-profile G-tube. She has high-volume regurgitation of the tube feeds after a bolus, which is suspected to be causing her chronic aspiration PNA. After discussing options with Dr. Lanney Gins yesterday patient and husband agreed for exchange of G-tube for a J-tube by IR. Husband and patient were not aware that once patient has a J-tube she will need to use a pump for feeds. Discussed options for pump feeding. Patient is active during the day but she would like to try daytime feeds with a backpack for her feeding tube pump. She does not want feeds overnight at this time as she sleeps flat and does not think she could stay upright enough overnight. Patient used to be followed by RD at [facility]. Could not find the RD notes in Care Everywhere. Husband reports they were supposed to be giving 6 cans per day of a tube feed formula (could not recall the formula) but patient could not tolerate this. He now purchases Boost from [place] and patient provides up to 4 cans per day if she can tolerate. Some days it is only 2 cans per day due to intolerance/regurgitation or if she forgets to provide the tube feeding. Husband brought tube feeding into hospital. Each bottle provides 360 kcal and 14 grams of protein. This is a total of approximately 1440 kcal and  56 grams of protein daily at most. Patient also provides 120 mL water before and after each bolus feeding. She also occasionally provides Gatorade per tube. Patient and husband would like to try Osmolite 1.5 Cal over 12 hours during the daytime to see how she tolerates.   Husband reports patient did lost weight many years ago but has now been stable at 101-104 lbs. Patient is currently documented to be 46.2  kg (101.85 lbs).  Medications reviewed and include: Augmentin, Levaquin, levothyroxine, Protonix, potassium chloride 20 mEq TID.  Labs reviewed: Sodium 132, Chloride 93, BUN 27.  Discussed with RN and MD. Plan is for placement of J-tube in radiology today.   NUTRITION - FOCUSED PHYSICAL EXAM:    Most Recent Value  Orbital Region Severe depletion  Upper Arm Region Severe depletion  Thoracic and Lumbar Region Severe depletion  Buccal Region Severe depletion  Temple Region Severe depletion  Clavicle Bone Region Severe depletion  Clavicle and Acromion Bone Region Severe depletion  Scapular Bone Region Severe depletion  Dorsal Hand Severe depletion  Patellar Region Severe depletion  Anterior Thigh Region Severe depletion  Posterior Calf Region Moderate depletion  Edema (RD Assessment) None  Hair Reviewed  Eyes Reviewed  Mouth Unable to assess  Skin Reviewed  Nails Reviewed     Diet Order:   Diet Order            Diet NPO time specified  Diet effective now                EDUCATION NEEDS:   No education needs have been identified at this time  Skin:  Skin Assessment: Reviewed RN Assessment  Last BM:  02/07/2020  Height:   Ht Readings from Last 1 Encounters:  02/06/20 '5\' 4"'  (1.626 m)   Weight:   Wt Readings from Last 1 Encounters:  02/09/20 46.2 kg   Ideal Body Weight:  54.5 kg  BMI:  Body mass index is 17.48 kg/m.  Estimated Nutritional Needs:   Kcal:  1400-1600  Protein:  70-80 grams  Fluid:  1.4-1.6 L/day  Jacklynn Barnacle, MS, RD, LDN Pager number available on Amion

## 2020-02-10 LAB — RENAL FUNCTION PANEL
Albumin: 2.9 g/dL — ABNORMAL LOW (ref 3.5–5.0)
Anion gap: 13 (ref 5–15)
BUN: 28 mg/dL — ABNORMAL HIGH (ref 8–23)
CO2: 27 mmol/L (ref 22–32)
Calcium: 9.4 mg/dL (ref 8.9–10.3)
Chloride: 93 mmol/L — ABNORMAL LOW (ref 98–111)
Creatinine, Ser: 0.63 mg/dL (ref 0.44–1.00)
GFR calc Af Amer: >60 mL/min (ref >60)
GFR calc non Af Amer: >60 mL/min (ref >60)
Glucose, Bld: 105 mg/dL — ABNORMAL HIGH (ref 70–99)
Phosphorus: 3.6 mg/dL (ref 2.5–4.6)
Potassium: 4.3 mmol/L (ref 3.5–5.1)
Sodium: 133 mmol/L — ABNORMAL LOW (ref 135–145)

## 2020-02-10 LAB — CULTURE, RESPIRATORY W GRAM STAIN

## 2020-02-10 LAB — MAGNESIUM: Magnesium: 1.9 mg/dL (ref 1.7–2.4)

## 2020-02-10 LAB — URINE CULTURE: Culture: 100 — AB

## 2020-02-10 MED ORDER — METOPROLOL TARTRATE 25 MG/10 ML ORAL SUSPENSION: 25.0000 mg | Freq: Two times a day (BID) | ORAL | 0 refills | Status: AC

## 2020-02-10 MED ORDER — LEVOFLOXACIN 500 MG PO TABS
500.0000 mg | ORAL_TABLET | Freq: Every day | ORAL | 0 refills | Status: AC
Start: 2020-02-10 — End: 2020-02-13

## 2020-02-10 MED ORDER — LEVOTHYROXINE SODIUM 88 MCG PO TABS
88.0000 ug | ORAL_TABLET | Freq: Every day | ORAL | Status: DC
  Filled 2020-02-10: qty 1

## 2020-02-10 MED ORDER — OSMOLITE 1.5 CAL PO LIQD
1080.0000 mL | ORAL | Status: DC
  Administered 2020-02-10: 11:00:00 1080 mL

## 2020-02-10 MED ORDER — LINEZOLID 600 MG PO TABS
600.0000 mg | ORAL_TABLET | Freq: Two times a day (BID) | ORAL | Status: DC
  Administered 2020-02-10: 600 mg
  Filled 2020-02-10 (×2): qty 1

## 2020-02-10 MED ORDER — LINEZOLID 600 MG PO TABS
600.0000 mg | ORAL_TABLET | Freq: Two times a day (BID) | ORAL | 0 refills | Status: AC
Start: 2020-02-10 — End: 2020-02-17

## 2020-02-10 MED ORDER — PANTOPRAZOLE SODIUM 40 MG PO PACK
40.0000 mg | PACK | Freq: Every day | ORAL | Status: DC
  Administered 2020-02-10: 40 mg
  Filled 2020-02-10: qty 20

## 2020-02-10 MED ORDER — MECLIZINE HCL 12.5 MG PO TABS: 12.5000 mg | ORAL_TABLET | Freq: Three times a day (TID) | ORAL | 0 refills | Status: AC | PRN

## 2020-02-10 MED ORDER — LEVOTHYROXINE SODIUM 88 MCG PO TABS: 88.0000 ug | ORAL_TABLET | Freq: Every day | ORAL | 1 refills | Status: AC

## 2020-02-10 NOTE — Discharge Summary (Addendum)
Physician Discharge Summary  CHRYSTIAN RESSLER WIO:[id number] DOB: [birth year] DOA: 02/06/2020  PCP: Barbaraann Boys, MD  Admit date: 02/06/2020 Discharge date: 02/10/2020  Admitted From: Home Disposition: Home  Recommendations for Outpatient Follow-up:  1. Follow up with PCP in 1-2 weeks 2. Follow-up with pulmonologist 3. Please obtain BMP/CBC in one week 4. 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 She has been followed as an outpatient for multi-focal pneumonia with CT chest 01/05/20 revealing wide-sp0read ground glass opacity, tree-in-bud centric lobular, mild cylindrical bronchectiasis that radiology v=favored for recurrent aspiration. She has had outpatient antibiotics- Augmentin. For increased SOB and chest pain she presented to Urgent Care on the day of admission where she was found to have pneumonia with hypoxemia to O2 sat of 77% on RA. She was referred to [facility] for further evaluation.  On arrival she was hypoxic requiring 4 L. Had procalcitonin positive at 0.87.  CTA negative for PE but revealed multifocal groundglass opacities, tree-in-bud findings.  Initially code sepsis was called and she was started on cefepime and vancomycin.  Sepsis ruled out.  Antibiotics changed to Augmentin per PEG tube.  Later sputum culture grew MRSA and Pseudomonas.  She was started on Levaquin for pseudomonal coverage and discharged home with Levaquin and linezolid for both MRSA and pseudomonal coverage.  She will follow-up with her pulmonologist for further management.  Patient continued to desaturate with ambulation and discharged on 2 L of home  oxygen.  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  Her G-tube was switched with J-tube by IR for persistent GERD which was contributing to her chronic aspiration. Patient will need continuous feed, feeding pump was ordered and detailed instructions were given. Palliative care was also consulted and they are recommending outpatient palliative.  Patient has chronic pain secondary to her cancer and follow-up with pain clinic which she will continue.  Patient has an history of hypothyroidism, her TSH was elevated at 13.56.  Her home dose of Synthroid was increased to 88 MCG on 02/07/2020.  She will need a repeat TSH in 4 to 6 weeks and close follow-up with primary care provider who can titrate dose accordingly.  She will continue rest of her home meds and inhalers.  Discharge Diagnoses:  Active Problems:   Oropharyngeal dysphagia   Essential hypertension   Hypothyroidism   Multifocal pneumonia   Chronic pain after cancer treatment   Protein-calorie malnutrition, severe   Tachycardia   Discharge Instructions  Discharge Instructions    Diet - low sodium heart healthy   Complete by: As directed    Discharge instructions   Complete by: As directed    It was pleasure taking care of you. You were given a prescription of levofloxacin for 3 more days and linezolid for 7 days according to your culture results. Please follow-up with your pulmonologist.   Increase activity slowly   Complete by: As directed      Allergies as of 02/10/2020      Reactions   Clavulanic Acid Diarrhea      Medication List    TAKE these medications   acetaminophen 500 MG tablet Commonly known as: TYLENOL Take 1,000 mg by mouth every 6 (  six) hours as needed for pain.   acetaminophen-codeine 120-12 MG/5ML solution Take 5 mLs by mouth every 6 (six) hours. What changed: Another medication with the same name was removed. Continue taking this medication,  and follow the directions you see here.   Acetylcysteine 600 MG Caps Take 1,200 mg by mouth daily at 6 (six) AM.   albuterol 108 (90 Base) MCG/ACT inhaler Commonly known as: VENTOLIN HFA Inhale 2 puffs into the lungs every 6 (six) hours as needed for wheezing or shortness of breath.   Biotene OralBalance Dry Mouth Gel by Transmucosal route 4 (four) times daily.   calcium carbonate 750 MG chewable tablet Commonly known as: TUMS EX Take 300 mg of elemental by mouth every 2 (two) hours as needed for Heartburn   fexofenadine 180 MG tablet Commonly known as: ALLEGRA Take 180 mg by mouth daily as needed for allergies or rhinitis.   HYDROcodone-acetaminophen 5-325 MG tablet Commonly known as: NORCO/VICODIN Take 1 tablet by mouth every 8 (eight) hours as needed.   levofloxacin 500 MG tablet Commonly known as: LEVAQUIN Take 1 tablet (500 mg total) by mouth daily for 3 days.   levothyroxine 88 MCG tablet Commonly known as: SYNTHROID Place 1 tablet (88 mcg total) into feeding tube daily at 6 (six) AM. Start taking on: February 11, 2020 What changed:   medication strength  how much to take  how to take this  when to take this   linezolid 600 MG tablet Commonly known as: Zyvox Place 1 tablet (600 mg total) into feeding tube 2 (two) times daily for 7 days. Crush tablet and mix in small amount of water to give via feeding tube.   meclizine 12.5 MG tablet Commonly known as: ANTIVERT Place 1 tablet (12.5 mg total) into feeding tube 3 (three) times daily as needed for dizziness.   metoprolol tartrate 25 mg/10 mL Susp Commonly known as: LOPRESSOR Place 10 mLs (25 mg total) into feeding tube 2 (two) times daily.   nystatin 100000 UNIT/ML suspension Commonly known as: MYCOSTATIN Take 5 mLs by mouth 2 (two) times daily.   omeprazole 20 MG capsule Commonly known as: PRILOSEC TAKE 1 CAPSULE EVERY DAY   pregabalin 50 MG capsule Commonly known as: LYRICA Take 1 capsule (50 mg total)  by mouth as directed Take 1 capsule in the morning and 2 capsules at nigh       Follow-up Information    Barbaraann Boys, MD. Schedule an appointment as soon as possible for a visit.   Specialty: Pediatrics Contact information: [place] [address] [phone]        Ottie Glazier, MD. Schedule an appointment as soon as possible for a visit.   Specialty: Pulmonary Disease Contact information: [place] [id number] [phone]              Allergies  Allergen Reactions  . Clavulanic Acid Diarrhea    Consultations: Pulmonary  Procedures/Studies: CT Angio Chest PE W and/or Wo Contrast  Result Date: 02/06/2020 CLINICAL DATA:  Shortness of breath and left chest pain EXAM: CT ANGIOGRAPHY CHEST WITH CONTRAST TECHNIQUE: Multidetector CT imaging of the chest was performed using the standard protocol during bolus administration of intravenous contrast. Multiplanar CT image reconstructions and MIPs were obtained to evaluate the vascular anatomy. CONTRAST:  101mL OMNIPAQUE IOHEXOL 350 MG/ML SOLN COMPARISON:  January 05, 2020 FINDINGS: Cardiovascular: There is a optimal opacification of the pulmonary arteries. There is no central,segmental, or subsegmental filling defects  within the pulmonary arteries. There is mild cardiomegaly. No pericardial effusion or thickening. No evidence right heart strain. There is normal three-vessel brachiocephalic anatomy without proximal stenosis. Scattered aortic calcifications and coronary artery calcifications are present. Mediastinum/Nodes: Pretracheal and subcarinal adenopathy is present within the subcarinal region the largest measuring 1.5 cm in short axis diameter, likely reactive. Thyroid gland, trachea, and esophagus demonstrate no significant findings. Lungs/Pleura: Interval progression extensive multifocal patchy airspace consolidation seen throughout both lungs. There scattered areas of bronchiectasis and  tree-in-bud opacities. There is also reticulonodular opacity seen predominantly at both lung bases. There is a trace right pleural effusion present. Upper Abdomen: No acute abnormalities present in the visualized portions of the upper abdomen. Musculoskeletal: No chest wall abnormality. No acute or significant osseous findings. Review of the MIP images confirms the above findings. IMPRESSION: 1.  No central, segmental, or subsegmental pulmonary embolism. 2. Interval progression in extensive multifocal airspace opacities and consolidation, consistent with multifocal pneumonia. 3. Trace right pleural effusion. 4.  Aortic Atherosclerosis (ICD10-I70.0). Electronically Signed   By: Prudencio Pair M.D.   On: 02/06/2020 18:46   IR DUODEN/JEJUNO TUBE INSERT PERCUT W/FL MOD SEC  Result Date: 02/09/2020 INDICATION: 74 year old female referred for placement of a gastrojejunostomy tube EXAM: PERC PLACEMENT JEJUNAL TUBE MEDICATIONS: None ANESTHESIA/SEDATION: None CONTRAST:  57mL VISIPAQUE IODIXANOL 320 MG/ML IV SOLN - administered into the gastric lumen. FLUOROSCOPY TIME:  Fluoroscopy Time: 8 minutes 24 seconds (56 mGy). COMPLICATIONS: None PROCEDURE: Informed written consent was obtained from the patient and the patient's family after a thorough discussion of the procedural risks, benefits and alternatives. All questions were addressed. Maximal Sterile Barrier Technique was utilized including caps, mask, sterile gowns, sterile gloves, sterile drape, hand hygiene and skin antiseptic. A timeout was performed prior to the initiation of the procedure. The epigastrium was prepped with Betadine in a sterile fashion, and a sterile drape was applied covering the operative field. A sterile gown and sterile gloves were used for the procedure. The indwelling low-profile gastrostomy tube was removed. A 45 cm Kumpe the catheter was navigated using contrast and a Glidewire through the pylorus into the proximal duodenum. Small amount of  contrast and gas were administered through the Kumpe the catheter, opacifying the first portion of the duodenum. Glidewire and the Kumpe the catheter were used to navigate into the third portion the duodenum. The vector of approach for the percutaneous gastrostomy was significantly cephalad. This caused a large loop on the wire and catheter during advancing the Kumpe the catheter into the duodenum. Thus the 45 cm Kumpe the catheter was hubbed in the first portion of the duodenum. The Glidewire was advanced as distal into the duodenum as possible and the 45 cm Kumpe the catheter was removed. We then passed a 4 French glide cath 65 cm over the Glidewire, in an attempt to reduce the loop and advanced the glide catheter into the duodenum. With a reasonable reduction in the loop in the stomach the glide cath was advanced distally. With a distal position, the Glidewire was removed and a rose in wire was placed for traction. Once the wire was in the third portion the duodenum, the catheter was removed. A new 18 French gastrojejunostomy then was passed over the [facility] an wire. Wire was removed. Contrast was injected and a final image was stored. Approximately 7 cc of saline used to inflate the balloon. Patient tolerated the procedure well and remained hemodynamically stable throughout. No complications were encountered and no significant blood loss encountered.  IMPRESSION: Status post fluoroscopic placed percutaneous gastrojejunostomy tube, with an 81 French balloon retention tube. Signed, Dulcy Fanny. Earleen Newport, DO [facility] Electronically Signed   By: Corrie Mckusick D.O.   On: 02/09/2020 14:21   DG Chest Port 1 View  Result Date: 02/09/2020 CLINICAL DATA:  74 year old female with abnormal chest radiograph and shortness of breath. EXAM: PORTABLE CHEST 1 VIEW COMPARISON:  Chest radiograph dated 02/07/2020 and CT dated 02/06/2020. FINDINGS: Slight interval improvement of  bilateral airspace opacities and aeration of the lungs compared to the prior radiograph of 02/07/2020. No large pleural effusion. No pneumothorax. Stable cardiac silhouette. No acute osseous pathology. IMPRESSION: Slight interval improvement of bilateral airspace opacities and aeration of the lungs compared to the prior radiograph. Continued follow-up recommended. Electronically Signed   By: Anner Crete M.D.   On: 02/09/2020 18:33   DG Chest Port 1 View  Result Date: 02/07/2020 CLINICAL DATA:  74 year old female with tachycardia. EXAM: PORTABLE CHEST 1 VIEW COMPARISON:  Chest radiograph dated 02/06/2020 and CT dated 02/06/2020 FINDINGS: Confluent bilateral airspace opacities slightly worsened in the right upper lobe compared to the prior radiograph. Small bilateral pleural effusions. No pneumothorax. The cardiac silhouette is within limits. No acute osseous pathology. IMPRESSION: Multifocal pneumonia with slight interval worsening of the right upper lobe airspace opacity compared to the prior radiograph. Continued follow-up recommended. Electronically Signed   By: Anner Crete M.D.   On: 02/07/2020 20:02   DG Chest Port 1 View  Result Date: 02/06/2020 CLINICAL DATA:  Shortness of breath, treated for chronic pneumonia EXAM: PORTABLE CHEST 1 VIEW COMPARISON:  CT 01/05/2020, radiograph 12/18/2019 FINDINGS: Increasingly coalescent airspace opacities throughout both lungs right slightly greater than left with some associated interstitial opacities as well. Findings are likely to reflect a progression of the process seen on comparison CT 01/05/2020. No pneumothorax or visible effusion. Stable cardiomediastinal contours. No acute osseous or soft tissue abnormality. Degenerative changes are present in the imaged spine and shoulders. Telemetry leads overlie the chest. IMPRESSION: Increasingly coalescent airspace opacities throughout both lungs right slightly greater than left, may reflect progression of a  multifocal infectious/inflammatory process seen on CT 01/05/2020. Electronically Signed   By: Lovena Le M.D.   On: 02/06/2020 17:24     Subjective: Patient has no new complaint today.  Husband was at bedside.  Discharge Exam: Vitals:   02/10/20 0840 02/10/20 1154  BP:  115/70  Pulse:  97  Resp:  20  Temp:  98.1 F (36.7 C)  SpO2: 97% 99%   Vitals:   02/10/20 0500 02/10/20 0742 02/10/20 0840 02/10/20 1154  BP:  (!) 147/88  115/70  Pulse:  100  97  Resp:  18  20  Temp:  98.7 F (37.1 C)  98.1 F (36.7 C)  TempSrc:    Axillary  SpO2:  99% 97% 99%  Weight: 46.1 kg     Height:        General: Pt is alert, awake, not in acute distress, frail emaciated lady. Cardiovascular: RRR, S1/S2 +, no rubs, no gallops Respiratory: CTA bilaterally, no wheezing, no rhonchi Abdominal: Soft, NT, ND, bowel sounds + Extremities: no edema, no cyanosis   The results of significant diagnostics from this hospitalization (including imaging, microbiology, ancillary and laboratory) are listed below for reference.    Microbiology: Recent Results (from the past 240 hour(s))  Blood Culture (routine x 2)     Status: None (Preliminary result)   Collection Time: 02/06/20  4:36 PM   Specimen: BLOOD  Result Value Ref Range Status   Specimen Description BLOOD RAC  Final   Special Requests   Final    BOTTLES DRAWN AEROBIC AND ANAEROBIC Blood Culture adequate volume   Culture   Final    NO GROWTH 4 DAYS Performed at [facility], [address] [place], [place] [address]    Report Status PENDING  Incomplete  Blood Culture (routine x 2)     Status: None (Preliminary result)   Collection Time: 02/06/20  4:36 PM   Specimen: BLOOD  Result Value Ref Range Status   Specimen Description BLOOD LAC  Final   Special Requests   Final    BOTTLES DRAWN AEROBIC AND ANAEROBIC Blood Culture adequate volume   Culture   Final    NO GROWTH 4 DAYS Performed at [facility], [address] [place], [place] [address]    Report Status PENDING  Incomplete  Urine culture     Status: Abnormal   Collection Time: 02/06/20  4:36 PM   Specimen: In/Out Cath Urine  Result Value Ref Range Status   Specimen Description   Final    IN/OUT CATH URINE Performed at [facility], [address] [place], [place] [address]    Special Requests   Final    NONE Performed at [facility], [address] [place], [place] [address]    Culture (A)  Final    100 COLONIES/mL METHICILLIN RESISTANT STAPHYLOCOCCUS AUREUS   Report Status 02/10/2020 FINAL  Final   Organism ID, Bacteria METHICILLIN RESISTANT STAPHYLOCOCCUS AUREUS (A)  Final      Susceptibility   Methicillin resistant staphylococcus aureus - MIC*    CIPROFLOXACIN >=8 RESISTANT Resistant     GENTAMICIN <=0.5 SENSITIVE Sensitive     NITROFURANTOIN <=16 SENSITIVE Sensitive     OXACILLIN >=4 RESISTANT Resistant     TETRACYCLINE <=1 SENSITIVE Sensitive     VANCOMYCIN <=0.5 SENSITIVE Sensitive     TRIMETH/SULFA <=10 SENSITIVE Sensitive     CLINDAMYCIN <=0.25 SENSITIVE Sensitive     RIFAMPIN <=0.5 SENSITIVE Sensitive     Inducible Clindamycin NEGATIVE Sensitive     * 100 COLONIES/mL METHICILLIN RESISTANT STAPHYLOCOCCUS AUREUS  SARS Coronavirus 2 by RT PCR (hospital order, performed in [place] hospital lab) Nasopharyngeal Nasopharyngeal Swab     Status: None   Collection Time: 02/06/20  6:41 PM   Specimen: Nasopharyngeal Swab  Result Value Ref Range Status   SARS Coronavirus 2 NEGATIVE NEGATIVE Final    Comment: (NOTE) SARS-CoV-2 target nucleic acids are NOT DETECTED.  The SARS-CoV-2 RNA is generally detectable in upper and lower respiratory specimens during the acute phase of infection. The lowest concentration of SARS-CoV-2 viral copies this assay can detect is 250 copies / mL. A negative result does not preclude SARS-CoV-2 infection and should not be used as the sole basis for treatment or other patient  management decisions.  A negative result may occur with improper specimen collection / handling, submission of specimen other than nasopharyngeal swab, presence of viral mutation(s) within the areas targeted by this assay, and inadequate number of viral copies (<250 copies / mL). A negative result must be combined with clinical observations, patient history, and epidemiological information.  Fact Sheet for Patients:   StrictlyIdeas.no  Fact Sheet for Healthcare Providers: BankingDealers.co.za  This test is not yet approved or  cleared by the [place] FDA and has been authorized for detection and/or diagnosis of SARS-CoV-2  by FDA under an Emergency Use Authorization (EUA).  This EUA will remain in effect (meaning this test can be used) for the duration of the COVID-19 declaration under Section 564(b)(1) of the Act, 21 U.S.C. section 360bbb-3(b)(1), unless the authorization is terminated or revoked sooner.  Performed at [facility], [address] [place], [place] [address]   Expectorated sputum assessment w rflx to resp cult     Status: None   Collection Time: 02/07/20  5:30 PM   Specimen: Sputum  Result Value Ref Range Status   Specimen Description SPUTUM  Final   Special Requests NONE  Final   Sputum evaluation   Final    THIS SPECIMEN IS ACCEPTABLE FOR SPUTUM CULTURE Performed at [facility], [address] [place], [place] [address]    Report Status 02/08/2020 FINAL  Final  MRSA PCR Screening     Status: Abnormal   Collection Time: 02/07/20  5:30 PM   Specimen: Nasopharyngeal  Result Value Ref Range Status   MRSA by PCR POSITIVE (A) NEGATIVE Final    Comment:        The GeneXpert MRSA Assay (FDA approved for NASAL specimens only), is one component of a comprehensive MRSA colonization surveillance program. It is not intended to diagnose MRSA infection nor to guide or monitor treatment  for MRSA infections. RESULT CALLED TO, READ BACK BY AND VERIFIED WITH: WANETTE MILES @1913  ON 02/07/20 SKL Performed at [facility], [address] [place], [place] [address]   Respiratory Panel by PCR     Status: None   Collection Time: 02/07/20  5:30 PM   Specimen: Nasopharyngeal Swab; Respiratory  Result Value Ref Range Status   Adenovirus NOT DETECTED NOT DETECTED Final   Coronavirus 229E NOT DETECTED NOT DETECTED Final    Comment: (NOTE) The Coronavirus on the Respiratory Panel, DOES NOT test for the novel  Coronavirus (2019 nCoV)    Coronavirus HKU1 NOT DETECTED NOT DETECTED Final   Coronavirus NL63 NOT DETECTED NOT DETECTED Final   Coronavirus OC43 NOT DETECTED NOT DETECTED Final   Metapneumovirus NOT DETECTED NOT DETECTED Final   Rhinovirus / Enterovirus NOT DETECTED NOT DETECTED Final   Influenza A NOT DETECTED NOT DETECTED Final   Influenza B NOT DETECTED NOT DETECTED Final   Parainfluenza Virus 1 NOT DETECTED NOT DETECTED Final   Parainfluenza Virus 2 NOT DETECTED NOT DETECTED Final   Parainfluenza Virus 3 NOT DETECTED NOT DETECTED Final   Parainfluenza Virus 4 NOT DETECTED NOT DETECTED Final   Respiratory Syncytial Virus NOT DETECTED NOT DETECTED Final   Bordetella pertussis NOT DETECTED NOT DETECTED Final   Chlamydophila pneumoniae NOT DETECTED NOT DETECTED Final   Mycoplasma pneumoniae NOT DETECTED NOT DETECTED Final    Comment: Performed at [facility], [place]. [address] [place], [place] [address]  Culture, respiratory     Status: None   Collection Time: 02/07/20  5:30 PM   Specimen: SPU  Result Value Ref Range Status   Specimen Description   Final    SPUTUM Performed at [facility], [address] [place], [place] [address]    Special Requests   Final    NONE Reflexed from [phone] Performed at [facility], [address] [place], [place] [address]    Gram Stain   Final    FEW WBC PRESENT, PREDOMINANTLY PMN MODERATE  GRAM POSITIVE COCCI IN PAIRS IN CHAINS FEW YEAST Performed at [facility], [place] [address],  [place], [place] [address]    Culture   Final    FEW METHICILLIN RESISTANT STAPHYLOCOCCUS AUREUS FEW PSEUDOMONAS AERUGINOSA    Report Status 02/10/2020 FINAL  Final   Organism ID, Bacteria METHICILLIN RESISTANT STAPHYLOCOCCUS AUREUS  Final   Organism ID, Bacteria PSEUDOMONAS AERUGINOSA  Final      Susceptibility   Methicillin resistant staphylococcus aureus - MIC*    CIPROFLOXACIN >=8 RESISTANT Resistant     ERYTHROMYCIN >=8 RESISTANT Resistant     GENTAMICIN <=0.5 SENSITIVE Sensitive     OXACILLIN >=4 RESISTANT Resistant     TETRACYCLINE <=1 SENSITIVE Sensitive     VANCOMYCIN 1 SENSITIVE Sensitive     TRIMETH/SULFA <=10 SENSITIVE Sensitive     CLINDAMYCIN RESISTANT Resistant     RIFAMPIN <=0.5 SENSITIVE Sensitive     Inducible Clindamycin POSITIVE Resistant     * FEW METHICILLIN RESISTANT STAPHYLOCOCCUS AUREUS   Pseudomonas aeruginosa - MIC*    CEFTAZIDIME 2 SENSITIVE Sensitive     CIPROFLOXACIN <=0.25 SENSITIVE Sensitive     GENTAMICIN <=1 SENSITIVE Sensitive     IMIPENEM 2 SENSITIVE Sensitive     PIP/TAZO <=4 SENSITIVE Sensitive     CEFEPIME 2 SENSITIVE Sensitive     * FEW PSEUDOMONAS AERUGINOSA     Labs: BNP (last 3 results) No results for input(s): BNP in the last 8760 hours. Basic Metabolic Panel: Recent Labs  Lab 02/06/20 1726 02/07/20 2008 02/08/20 0412 02/09/20 0413 02/10/20 0824 02/10/20 0828  NA 130* 133* 135 132*  --  133*  K 3.8 3.4* 3.6 4.1  --  4.3  CL 95* 91* 92* 93*  --  93*  CO2 25 28 30 31   --  27  GLUCOSE 110* 125* 116* 132*  --  105*  BUN 20 21 27* 27*  --  28*  CREATININE 0.82 0.73 0.70 0.72  --  0.63  CALCIUM 8.9 9.3 9.2 9.0  --  9.4  MG  --  1.9  --   --  1.9  --   PHOS  --   --   --   --   --  3.6   Liver Function Tests: Recent Labs  Lab 02/06/20 1726 02/10/20 0828  AST 30  --   ALT 23  --   ALKPHOS 110  --   BILITOT 1.2  --    PROT 7.4  --   ALBUMIN 3.5 2.9*   Recent Labs  Lab 02/06/20 1726  LIPASE 22   No results for input(s): AMMONIA in the last 168 hours. CBC: Recent Labs  Lab 02/06/20 1636 02/08/20 0412 02/09/20 0413  WBC 11.9* 17.2* 12.1*  NEUTROABS 10.3*  --   --   HGB 10.2* 12.1 10.2*  HCT 31.5* 35.8* 30.0*  MCV 95.5 92.0 92.3  PLT 269 328 293   Cardiac Enzymes: No results for input(s): CKTOTAL, CKMB, CKMBINDEX, TROPONINI in the last 168 hours. BNP: Invalid input(s): POCBNP CBG: Recent Labs  Lab 02/07/20 2108  GLUCAP 141*   D-Dimer No results for input(s): DDIMER in the last 72 hours. Hgb A1c No results for input(s): HGBA1C in the last 72 hours. Lipid Profile No results for input(s): CHOL, HDL, LDLCALC, TRIG, CHOLHDL, LDLDIRECT in the last 72 hours. Thyroid function studies Recent Labs    02/07/20 2008  TSH 13.565*   Anemia work up No results for input(s): VITAMINB12, FOLATE, FERRITIN, TIBC, IRON, RETICCTPCT in the last 72 hours. Urinalysis    Component Value Date/Time   COLORURINE STRAW (A) 02/06/2020  [place] (A) 02/06/2020 1636   LABSPEC 1.016 02/06/2020 1636   PHURINE 7.0 02/06/2020 1636   GLUCOSEU 50 (A) 02/06/2020 1636   HGBUR NEGATIVE 02/06/2020 1636   BILIRUBINUR NEGATIVE 02/06/2020 1636   KETONESUR NEGATIVE 02/06/2020 1636   PROTEINUR NEGATIVE 02/06/2020 1636   NITRITE NEGATIVE 02/06/2020 1636   LEUKOCYTESUR NEGATIVE 02/06/2020 1636   Sepsis Labs Invalid input(s): PROCALCITONIN,  WBC,  LACTICIDVEN Microbiology Recent Results (from the past 240 hour(s))  Blood Culture (routine x 2)     Status: None (Preliminary result)   Collection Time: 02/06/20  4:36 PM   Specimen: BLOOD  Result Value Ref Range Status   Specimen Description BLOOD RAC  Final   Special Requests   Final    BOTTLES DRAWN AEROBIC AND ANAEROBIC Blood Culture adequate volume   Culture   Final    NO GROWTH 4 DAYS Performed at [facility] [address]  [place], [place] [address]    Report Status PENDING  Incomplete  Blood Culture (routine x 2)     Status: None (Preliminary result)   Collection Time: 02/06/20  4:36 PM   Specimen: BLOOD  Result Value Ref Range Status   Specimen Description BLOOD LAC  Final   Special Requests   Final    BOTTLES DRAWN AEROBIC AND ANAEROBIC Blood Culture adequate volume   Culture   Final    NO GROWTH 4 DAYS Performed at [facility], [address], [place], [place] [address]    Report Status PENDING  Incomplete  Urine culture     Status: Abnormal   Collection Time: 02/06/20  4:36 PM   Specimen: In/Out Cath Urine  Result Value Ref Range Status   Specimen Description   Final    IN/OUT CATH URINE Performed at [facility], [address] [place], [place] [address]    Special Requests   Final    NONE Performed at [facility], [address], [place], [place] [address]    Culture (A)  Final    100 COLONIES/mL METHICILLIN RESISTANT STAPHYLOCOCCUS AUREUS   Report Status 02/10/2020 FINAL  Final   Organism ID, Bacteria METHICILLIN RESISTANT STAPHYLOCOCCUS AUREUS (A)  Final      Susceptibility   Methicillin resistant staphylococcus aureus - MIC*    CIPROFLOXACIN >=8 RESISTANT Resistant     GENTAMICIN <=0.5 SENSITIVE Sensitive     NITROFURANTOIN <=16 SENSITIVE Sensitive     OXACILLIN >=4 RESISTANT Resistant     TETRACYCLINE <=1 SENSITIVE Sensitive     VANCOMYCIN <=0.5 SENSITIVE Sensitive     TRIMETH/SULFA <=10 SENSITIVE Sensitive     CLINDAMYCIN <=0.25 SENSITIVE Sensitive     RIFAMPIN <=0.5 SENSITIVE Sensitive     Inducible Clindamycin NEGATIVE Sensitive     * 100 COLONIES/mL METHICILLIN RESISTANT STAPHYLOCOCCUS AUREUS  SARS Coronavirus 2 by RT PCR (hospital order, performed in [place] hospital lab) Nasopharyngeal Nasopharyngeal Swab     Status: None   Collection Time: 02/06/20  6:41 PM   Specimen: Nasopharyngeal Swab  Result Value Ref Range Status   SARS Coronavirus 2  NEGATIVE NEGATIVE Final    Comment: (NOTE) SARS-CoV-2 target nucleic acids are NOT DETECTED.  The SARS-CoV-2 RNA is generally detectable in upper and lower respiratory specimens during the acute phase of infection. The lowest concentration of SARS-CoV-2 viral copies this assay can detect is 250 copies / mL. A negative result does not preclude SARS-CoV-2 infection and should not be used as the sole  basis for treatment or other patient management decisions.  A negative result may occur with improper specimen collection / handling, submission of specimen other than nasopharyngeal swab, presence of viral mutation(s) within the areas targeted by this assay, and inadequate number of viral copies (<250 copies / mL). A negative result must be combined with clinical observations, patient history, and epidemiological information.  Fact Sheet for Patients:   StrictlyIdeas.no  Fact Sheet for Healthcare Providers: BankingDealers.co.za  This test is not yet approved or  cleared by the [place] FDA and has been authorized for detection and/or diagnosis of SARS-CoV-2 by FDA under an Emergency Use Authorization (EUA).  This EUA will remain in effect (meaning this test can be used) for the duration of the COVID-19 declaration under Section 564(b)(1) of the Act, 21 U.S.C. section 360bbb-3(b)(1), unless the authorization is terminated or revoked sooner.  Performed at [facility], [address] [place], [place] [address]   Expectorated sputum assessment w rflx to resp cult     Status: None   Collection Time: 02/07/20  5:30 PM   Specimen: Sputum  Result Value Ref Range Status   Specimen Description SPUTUM  Final   Special Requests NONE  Final   Sputum evaluation   Final    THIS SPECIMEN IS ACCEPTABLE FOR SPUTUM CULTURE Performed at [facility], [address] [place], [place] [address]    Report Status 02/08/2020 FINAL   Final  MRSA PCR Screening     Status: Abnormal   Collection Time: 02/07/20  5:30 PM   Specimen: Nasopharyngeal  Result Value Ref Range Status   MRSA by PCR POSITIVE (A) NEGATIVE Final    Comment:        The GeneXpert MRSA Assay (FDA approved for NASAL specimens only), is one component of a comprehensive MRSA colonization surveillance program. It is not intended to diagnose MRSA infection nor to guide or monitor treatment for MRSA infections. RESULT CALLED TO, READ BACK BY AND VERIFIED WITH: WANETTE MILES @1913  ON 02/07/20 SKL Performed at [facility], [address] [place], [place] [address]   Respiratory Panel by PCR     Status: None   Collection Time: 02/07/20  5:30 PM   Specimen: Nasopharyngeal Swab; Respiratory  Result Value Ref Range Status   Adenovirus NOT DETECTED NOT DETECTED Final   Coronavirus 229E NOT DETECTED NOT DETECTED Final    Comment: (NOTE) The Coronavirus on the Respiratory Panel, DOES NOT test for the novel  Coronavirus (2019 nCoV)    Coronavirus HKU1 NOT DETECTED NOT DETECTED Final   Coronavirus NL63 NOT DETECTED NOT DETECTED Final   Coronavirus OC43 NOT DETECTED NOT DETECTED Final   Metapneumovirus NOT DETECTED NOT DETECTED Final   Rhinovirus / Enterovirus NOT DETECTED NOT DETECTED Final   Influenza A NOT DETECTED NOT DETECTED Final   Influenza B NOT DETECTED NOT DETECTED Final   Parainfluenza Virus 1 NOT DETECTED NOT DETECTED Final   Parainfluenza Virus 2 NOT DETECTED NOT DETECTED Final   Parainfluenza Virus 3 NOT DETECTED NOT DETECTED Final   Parainfluenza Virus 4 NOT DETECTED NOT DETECTED Final   Respiratory Syncytial Virus NOT DETECTED NOT DETECTED Final   Bordetella pertussis NOT DETECTED NOT DETECTED Final   Chlamydophila pneumoniae NOT DETECTED NOT DETECTED Final   Mycoplasma pneumoniae NOT DETECTED NOT DETECTED Final    Comment: Performed at [facility], [place]. [address] [place], [place] [address]  Culture, respiratory      Status: None  Collection Time: 02/07/20  5:30 PM   Specimen: SPU  Result Value Ref Range Status   Specimen Description   Final    SPUTUM Performed at [facility], [address] [place], [place] [address]    Special Requests   Final    NONE Reflexed from [id number] Performed at [facility], [address] [place], [place] [address]    Gram Stain   Final    FEW WBC PRESENT, PREDOMINANTLY PMN MODERATE GRAM POSITIVE COCCI IN PAIRS IN CHAINS FEW YEAST Performed at [facility], [place] [address] [place], [place] [address]    Culture   Final    FEW METHICILLIN RESISTANT STAPHYLOCOCCUS AUREUS FEW PSEUDOMONAS AERUGINOSA    Report Status 02/10/2020 FINAL  Final   Organism ID, Bacteria METHICILLIN RESISTANT STAPHYLOCOCCUS AUREUS  Final   Organism ID, Bacteria PSEUDOMONAS AERUGINOSA  Final      Susceptibility   Methicillin resistant staphylococcus aureus - MIC*    CIPROFLOXACIN >=8 RESISTANT Resistant     ERYTHROMYCIN >=8 RESISTANT Resistant     GENTAMICIN <=0.5 SENSITIVE Sensitive     OXACILLIN >=4 RESISTANT Resistant     TETRACYCLINE <=1 SENSITIVE Sensitive     VANCOMYCIN 1 SENSITIVE Sensitive     TRIMETH/SULFA <=10 SENSITIVE Sensitive     CLINDAMYCIN RESISTANT Resistant     RIFAMPIN <=0.5 SENSITIVE Sensitive     Inducible Clindamycin POSITIVE Resistant     * FEW METHICILLIN RESISTANT STAPHYLOCOCCUS AUREUS   Pseudomonas aeruginosa - MIC*    CEFTAZIDIME 2 SENSITIVE Sensitive     CIPROFLOXACIN <=0.25 SENSITIVE Sensitive     GENTAMICIN <=1 SENSITIVE Sensitive     IMIPENEM 2 SENSITIVE Sensitive     PIP/TAZO <=4 SENSITIVE Sensitive     CEFEPIME 2 SENSITIVE Sensitive     * FEW PSEUDOMONAS AERUGINOSA    Time coordinating discharge: Over 30 minutes  SIGNED:  Lorella Nimrod, MD  [facility] 02/10/2020, 2:33 PM  If 7PM-7AM, please contact night-coverage [email or web address]  This record has been created using Systems analyst. Errors  have been sought and corrected,but may not always be located. Such creation errors do not reflect on the standard of care.

## 2020-02-10 NOTE — Evaluation (Addendum)
Physical Therapy Evaluation Patient Details Name: Crystal Kent MRN: [id number] DOB: [birth year] Today's Date: 02/10/2020   History of Present Illness  presented to ER from urgent care and admitted for management of multifocal aspiration PNA with chronic aspiration; s/p PEG exchange (7/21)  Clinical Impression  Upon evaluation, patient alert and oriented; follows commands and demonstrates good effort with mobility tasks.  Bilat UE/LE strength and ROM grossly symmetrical and WFL; no focal weakness appreciated.  Able to complete bed mobility with indep; sit/stand, basic transfers and gait (400') without assist device, sup/mod indep.  Demonstrates reciprocal stepping pattern, good step height/length; fair cadence and gait speed (10' walk time, 8-9 seconds). Mild sway with head turns, able to self-correct with LE step strategy. Does desat to 83-84% with gait efforts on 2L, requiring approx 2 minutes for recovery >92% on 2L.  RN /MD informed/aware.  Anticipate possible need for home O2 at discharge. Would benefit from continued PT services during remaining hospitalization to promote continued mobility, O2 management; anticipate no skilled PT needs upon discharge.  SaO2 on room air at rest = 85% SaO2 on 2L at rest = 93% SaO2 on 2L air while ambulating = 83%, recovers >92% on 2L within 2 min seated rest and pursed lip breathing        Follow Up Recommendations No PT follow up    Equipment Recommendations       Recommendations for Other Services       Precautions / Restrictions Precautions Precautions: Fall Restrictions Weight Bearing Restrictions: No      Mobility  Bed Mobility Overal bed mobility: Independent                Transfers Overall transfer level: Modified independent Equipment used: None             General transfer comment: good LE strength/control  Ambulation/Gait Ambulation/Gait assistance: Supervision Gait Distance (Feet): 400 Feet Assistive  device: None   Gait velocity: 10' walk time, 8-9 seconds   General Gait Details: reciprocal stepping pattern, good step height/length; fair cadence and gait speed. Mild sway with head turns, able to self-correct with LE step strategy  Stairs            Wheelchair Mobility    Modified Rankin (Stroke Patients Only)       Balance Overall balance assessment: Needs assistance Sitting-balance support: No upper extremity supported;Feet supported Sitting balance-Leahy Scale: Good     Standing balance support: No upper extremity supported Standing balance-Leahy Scale: Good                               Pertinent Vitals/Pain Pain Assessment: No/denies pain    Home Living Family/patient expects to be discharged to:: Private residence Living Arrangements: Spouse/significant other Available Help at Discharge: Family;Available 24 hours/day Type of Home: House Home Access: Stairs to enter   [employer] of Steps: 1 Home Layout: One level        Prior Function Level of Independence: Independent         Comments: Indep with ADLs, household and community mobilization without assist device; no home O2; denies fall history     Hand Dominance        Extremity/Trunk Assessment   Upper Extremity Assessment Upper Extremity Assessment: Overall WFL for tasks assessed    Lower Extremity Assessment Lower Extremity Assessment: Overall WFL for tasks assessed       Communication   Communication:  (  history of oral cancer s/p oral reconstruction)  Cognition Arousal/Alertness: Awake/alert Behavior During Therapy: WFL for tasks assessed/performed Overall Cognitive Status: Within Functional Limits for tasks assessed                                        General Comments      Exercises     Assessment/Plan    PT Assessment Patient needs continued PT services  PT Problem List Decreased strength;Decreased range of motion;Decreased  activity tolerance;Decreased balance;Decreased mobility;Decreased knowledge of use of DME;Decreased safety awareness;Decreased knowledge of precautions;Cardiopulmonary status limiting activity       PT Treatment Interventions DME instruction;Gait training;Therapeutic activities;Functional mobility training;Therapeutic exercise;Balance training;Patient/family education    PT Goals (Current goals can be found in the Care Plan section)  Acute Rehab PT Goals Patient Stated Goal: to return home PT Goal Formulation: With patient/family Time For Goal Achievement: 02/24/20 Potential to Achieve Goals: Good    Frequency Min 2X/week   Barriers to discharge        Co-evaluation               AM-PAC PT "6 Clicks" Mobility  Outcome Measure Help needed turning from your back to your side while in a flat bed without using bedrails?: None Help needed moving from lying on your back to sitting on the side of a flat bed without using bedrails?: None Help needed moving to and from a bed to a chair (including a wheelchair)?: None Help needed standing up from a chair using your arms (e.g., wheelchair or bedside chair)?: None Help needed to walk in hospital room?: None Help needed climbing 3-5 steps with a railing? : None 6 Click Score: 24    End of Session Equipment Utilized During Treatment: Gait belt Activity Tolerance: Patient tolerated treatment well Patient left: in bed;with call bell/phone within reach;with family/visitor present Nurse Communication: Mobility status PT Visit Diagnosis: Muscle weakness (generalized) (M62.81);Difficulty in walking, not elsewhere classified (R26.2)    Time: 5638-7564 PT Time Calculation (min) (ACUTE ONLY): 20 min   Charges:   PT Evaluation $PT Eval Low Complexity: 1 Low         Lamyiah Crawshaw H. Owens Shark, PT, DPT, NCS 02/10/20, 3:05 PM [phone]

## 2020-02-10 NOTE — TOC Transition Note (Signed)
Transition of Care [facility]) - CM/SW Discharge Note   Patient Details  Name: Crystal Kent MRN: [id number] Date of Birth: 06/21/1946  Transition of [facility] CM/SW Contact:  Shelbie Hutching, RN Phone Number: 02/10/2020, 2:33 PM   Clinical Narrative:    Plan for patient to discharge home today with home health services.  Patient will also discharge with a new tube feeding pump and tube feeds.  [employer] is providing the tube feed pump and tube feeds.  Home health will be provided by Kem Kays with [facility] is aware of discharge today.  Patient's husband will provide transportation home.    Final next level of care: [place] Barriers to Discharge: Barriers Resolved   Patient Goals and CMS Choice Patient states their goals for this hospitalization and ongoing recovery are:: For her pneumonia to clear up CMS Medicare.gov Compare Post Acute Care list provided to:: Patient Choice offered to / list presented to : Patient  Discharge Placement                       Discharge Plan and Services   Discharge Planning Services: CM Consult Post Acute Care Choice: Home Health          DME Arranged: Tube feeding, Tube feeding pump DME Agency: [employer] Date DME Agency Contacted: 02/10/20 Time DME Agency Contacted: 1410 Representative spoke with at DME Agency: [place]: RN, PT, Nurse's Aide [place] Agency: [place] Date [place]: 02/10/20 Time [place]: 1433 Representative spoke with at [place]: [place] (SDOH) Interventions     Readmission Risk Interventions No flowsheet data found.

## 2020-02-10 NOTE — Discharge Instructions (Signed)
Based on your TSH we increase the dose of Synthroid.  Please follow-up with your primary care provider and you will need a repeat testing in 4 to 6 weeks.

## 2020-02-10 NOTE — TOC Transition Note (Signed)
Transition of Care [facility]) - CM/SW Discharge Note   Patient Details  Name: Crystal Kent MRN: [id number] Date of Birth: 10-Sep-1945  Transition of Care [facility]) CM/SW Contact:  Shelbie Hutching, RN Phone Number: 02/10/2020, 3:17 PM   Clinical Narrative:     Patient will need oxygen at home.  [employer] will bring oxygen to the room before patient discharges.  The tube feed pump and tube feeds will be delivered to the patient's home.    Final next level of care: [place] Barriers to Discharge: Barriers Resolved   Patient Goals and CMS Choice Patient states their goals for this hospitalization and ongoing recovery are:: For her pneumonia to clear up CMS Medicare.gov Compare Post Acute Care list provided to:: Patient Choice offered to / list presented to : Patient  Discharge Placement                       Discharge Plan and Services   Discharge Planning Services: CM Consult Post Acute Care Choice: Home Health          DME Arranged: Tube feeding, Tube feeding pump DME Agency: [employer] Date DME Agency Contacted: 02/10/20 Time DME Agency Contacted: [phone] Representative spoke with at DME Agency: [place]: RN, PT, Nurse's Aide [place] Agency: [place] Date [place]: 02/10/20 Time [place]: 1433 Representative spoke with at [place]: [place] (SDOH) Interventions     Readmission Risk Interventions No flowsheet data found.

## 2020-02-11 DIAGNOSIS — C01 Malignant neoplasm of base of tongue: Secondary | ICD-10-CM | POA: Diagnosis not present

## 2020-02-11 DIAGNOSIS — K9423 Gastrostomy malfunction: Secondary | ICD-10-CM | POA: Diagnosis not present

## 2020-02-11 DIAGNOSIS — R1312 Dysphagia, oropharyngeal phase: Secondary | ICD-10-CM | POA: Diagnosis not present

## 2020-02-11 LAB — CULTURE, BLOOD (ROUTINE X 2)
Culture: NO GROWTH
Culture: NO GROWTH
Special Requests: ADEQUATE
Special Requests: ADEQUATE

## 2020-02-14 ENCOUNTER — Other Ambulatory Visit: Payer: Self-pay

## 2020-02-14 ENCOUNTER — Encounter: Payer: Self-pay | Admitting: Pulmonary Disease

## 2020-02-14 ENCOUNTER — Ambulatory Visit (INDEPENDENT_AMBULATORY_CARE_PROVIDER_SITE_OTHER): Payer: Medicare HMO | Admitting: Pulmonary Disease

## 2020-02-14 DIAGNOSIS — J189 Pneumonia, unspecified organism: Secondary | ICD-10-CM

## 2020-02-14 NOTE — Progress Notes (Signed)
Patient is an established patient of  Dr. Darlen Round at [facility].  Patient would want to continue following with Dr. Lanney Gins and we encouraged this as she is already established with him.  Patient was recently discharged from [facility] on 10 February 2020.  The patient will follow up with Dr. Lanney Gins.  She had no acute issues today.  Appointment here was made in error.  This visit was canceled.  Renold Don, MD [place] [facility]   *This note was dictated using voice recognition software/Dragon.  Despite best efforts to proofread, errors can occur which can change the meaning.  Any change was purely unintentional.

## 2020-02-15 DIAGNOSIS — R1312 Dysphagia, oropharyngeal phase: Secondary | ICD-10-CM | POA: Diagnosis not present

## 2020-02-15 DIAGNOSIS — C01 Malignant neoplasm of base of tongue: Secondary | ICD-10-CM | POA: Diagnosis not present

## 2020-02-15 DIAGNOSIS — K9423 Gastrostomy malfunction: Secondary | ICD-10-CM | POA: Diagnosis not present

## 2020-02-23 DIAGNOSIS — J189 Pneumonia, unspecified organism: Secondary | ICD-10-CM | POA: Diagnosis not present

## 2020-02-24 ENCOUNTER — Telehealth: Payer: Self-pay | Admitting: Pediatrics

## 2020-02-24 NOTE — Telephone Encounter (Signed)
Copied from [place] [phone]. Topic: General - Inquiry >> Feb 24, 2020  3:56 PM Alease Frame wrote: Reason for CRM: Harlon Flor from [employer] [employer] was calling to confirm pt was referred to family medicine on [id number] from office  Reference [id number] Call back number [phone]

## 2020-03-12 DIAGNOSIS — C01 Malignant neoplasm of base of tongue: Secondary | ICD-10-CM | POA: Diagnosis not present

## 2020-03-12 DIAGNOSIS — K9423 Gastrostomy malfunction: Secondary | ICD-10-CM | POA: Diagnosis not present

## 2020-03-12 DIAGNOSIS — R1312 Dysphagia, oropharyngeal phase: Secondary | ICD-10-CM | POA: Diagnosis not present

## 2020-03-13 DIAGNOSIS — K9423 Gastrostomy malfunction: Secondary | ICD-10-CM | POA: Diagnosis not present

## 2020-03-13 DIAGNOSIS — C01 Malignant neoplasm of base of tongue: Secondary | ICD-10-CM | POA: Diagnosis not present

## 2020-03-13 DIAGNOSIS — R1312 Dysphagia, oropharyngeal phase: Secondary | ICD-10-CM | POA: Diagnosis not present

## 2020-03-15 DIAGNOSIS — I700 Atherosclerosis of aorta: Secondary | ICD-10-CM | POA: Diagnosis not present

## 2020-03-15 DIAGNOSIS — R42 Dizziness and giddiness: Secondary | ICD-10-CM | POA: Diagnosis not present

## 2020-03-15 DIAGNOSIS — R1312 Dysphagia, oropharyngeal phase: Secondary | ICD-10-CM | POA: Diagnosis not present

## 2020-03-15 DIAGNOSIS — R7989 Other specified abnormal findings of blood chemistry: Secondary | ICD-10-CM | POA: Diagnosis not present

## 2020-03-15 DIAGNOSIS — E039 Hypothyroidism, unspecified: Secondary | ICD-10-CM | POA: Diagnosis not present

## 2020-03-15 DIAGNOSIS — J189 Pneumonia, unspecified organism: Secondary | ICD-10-CM | POA: Diagnosis not present

## 2020-03-15 DIAGNOSIS — Z931 Gastrostomy status: Secondary | ICD-10-CM | POA: Diagnosis not present

## 2020-03-15 DIAGNOSIS — E871 Hypo-osmolality and hyponatremia: Secondary | ICD-10-CM | POA: Diagnosis not present

## 2020-03-15 DIAGNOSIS — I10 Essential (primary) hypertension: Secondary | ICD-10-CM | POA: Diagnosis not present

## 2020-04-04 DIAGNOSIS — F54 Psychological and behavioral factors associated with disorders or diseases classified elsewhere: Secondary | ICD-10-CM | POA: Diagnosis not present

## 2020-04-04 DIAGNOSIS — G893 Neoplasm related pain (acute) (chronic): Secondary | ICD-10-CM | POA: Diagnosis not present

## 2020-04-06 DIAGNOSIS — Z5181 Encounter for therapeutic drug level monitoring: Secondary | ICD-10-CM | POA: Diagnosis not present

## 2020-04-06 DIAGNOSIS — G893 Neoplasm related pain (acute) (chronic): Secondary | ICD-10-CM | POA: Diagnosis not present

## 2020-04-06 DIAGNOSIS — Z79891 Long term (current) use of opiate analgesic: Secondary | ICD-10-CM | POA: Diagnosis not present

## 2020-04-11 DIAGNOSIS — K9423 Gastrostomy malfunction: Secondary | ICD-10-CM | POA: Diagnosis not present

## 2020-04-11 DIAGNOSIS — C01 Malignant neoplasm of base of tongue: Secondary | ICD-10-CM | POA: Diagnosis not present

## 2020-04-11 DIAGNOSIS — R1312 Dysphagia, oropharyngeal phase: Secondary | ICD-10-CM | POA: Diagnosis not present

## 2020-04-13 DIAGNOSIS — K9423 Gastrostomy malfunction: Secondary | ICD-10-CM | POA: Diagnosis not present

## 2020-04-13 DIAGNOSIS — C01 Malignant neoplasm of base of tongue: Secondary | ICD-10-CM | POA: Diagnosis not present

## 2020-04-13 DIAGNOSIS — R1312 Dysphagia, oropharyngeal phase: Secondary | ICD-10-CM | POA: Diagnosis not present

## 2020-04-27 ENCOUNTER — Encounter: Payer: Self-pay | Admitting: Gastroenterology

## 2020-04-27 ENCOUNTER — Ambulatory Visit (INDEPENDENT_AMBULATORY_CARE_PROVIDER_SITE_OTHER): Payer: Medicare HMO | Admitting: Gastroenterology

## 2020-04-27 ENCOUNTER — Other Ambulatory Visit: Payer: Self-pay

## 2020-04-27 VITALS — BP 201/104 | HR 103 | Ht 64.0 in | Wt 102.0 lb

## 2020-04-27 DIAGNOSIS — K9423 Gastrostomy malfunction: Secondary | ICD-10-CM | POA: Diagnosis not present

## 2020-04-27 NOTE — Progress Notes (Signed)
Primary Care Physician: Barbaraann Boys, MD  Primary Gastroenterologist:  Dr. Lucilla Lame  Chief Complaint  Patient presents with  . Peg tube replacement    HPI: Crystal Kent is a 74 y.o. female here due to PEG tube malfunction.  The patient reports that she felt like the balloon, that was placed by interventional radiology for a PEG to PEJ conversion, popped.  The patient has not had any feeding for the last 2 days because she was concerned that if the tube ruptured she would be doing her self damage.  The patient and her husband come in with a Mickey button PEG which she has used for many years and would like the tube that is in there now to be replaced.  Past Medical History:  Diagnosis Date  . Acid reflux   . Allergy   . 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 Difficult intubation    Pt reports "small airway"  . Feeding by G-tube ([place])    Nothing by mouth  . Hypertension   . Personal history of chemotherapy 1999  . Personal history of radiation therapy   . Thyroid disease   . Uses prosthesis    plate to cover hole in roof of mouth s/p cancer surgery, doesn't wear anymore  . Vertigo    no episodes for several years    Current Outpatient Medications  Medication Sig Dispense Refill  . acetaminophen (TYLENOL) 500 MG tablet Take 1,000 mg by mouth every 6 (six) hours as needed for pain.    Marland Kitchen acetaminophen-codeine 120-12 MG/5ML solution Take 5 mLs by mouth every 6 (six) hours.    . Acetylcysteine 600 MG CAPS Take 1,200 mg by mouth daily at 6 (six) AM.    . albuterol (PROVENTIL HFA;VENTOLIN HFA) 108 (90 Base) MCG/ACT inhaler Inhale 2 puffs into the lungs every 6 (six) hours as needed for wheezing or shortness of breath. 1 Inhaler 0  . Artificial Saliva (BIOTENE ORALBALANCE DRY MOUTH) GEL by Transmucosal route 4 (four) times daily.     . calcium carbonate (TUMS EX) 750 MG chewable tablet Take 300 mg of elemental by mouth every 2 (two) hours as needed for Heartburn      . fexofenadine (ALLEGRA) 180 MG tablet Take 180 mg by mouth daily as needed for allergies or rhinitis.    Marland Kitchen HYDROcodone-acetaminophen (NORCO/VICODIN) 5-325 MG tablet Take 1 tablet by mouth every 8 (eight) hours as needed.    Marland Kitchen levothyroxine (SYNTHROID) 88 MCG tablet Place 1 tablet (88 mcg total) into feeding tube daily at 6 (six) AM. 30 tablet 1  . meclizine (ANTIVERT) 12.5 MG tablet Place 1 tablet (12.5 mg total) into feeding tube 3 (three) times daily as needed for dizziness. 30 tablet 0  . metoprolol tartrate (LOPRESSOR) 25 mg/10 mL SUSP Place 10 mLs (25 mg total) into feeding tube 2 (two) times daily. 500 mL 0  . nystatin (MYCOSTATIN) 100000 UNIT/ML suspension Take 5 mLs by mouth 2 (two) times daily.    Marland Kitchen omeprazole (PRILOSEC) 20 MG capsule TAKE 1 CAPSULE EVERY DAY (Patient taking differently: Take 20 mg by mouth daily. ) 90 capsule 3  . pregabalin (LYRICA) 50 MG capsule Take 1 capsule (50 mg total) by mouth as directed Take 1 capsule in the morning and 2 capsules at nigh     No current facility-administered medications for this visit.    Allergies as of 04/27/2020 - Review Complete 04/27/2020  Allergen Reaction Noted  . Clavulanic acid  Diarrhea 10/09/2018    ROS:  General: Negative for anorexia, weight loss, fever, chills, fatigue, weakness. ENT: Negative for hoarseness, difficulty swallowing , nasal congestion. CV: Negative for chest pain, angina, palpitations, dyspnea on exertion, peripheral edema.  Respiratory: Negative for dyspnea at rest, dyspnea on exertion, cough, sputum, wheezing.  GI: See history of present illness. GU:  Negative for dysuria, hematuria, urinary incontinence, urinary frequency, nocturnal urination.  Endo: Negative for unusual weight change.    Physical Examination:   BP (!) 201/104   Pulse (!) 103   Ht 5\' 4"  (1.626 m)   Wt 102 lb (46.3 kg)   BMI 17.51 kg/m   General: Well-nourished, well-developed in no acute distress.  Eyes: No icterus.  Conjunctivae pink. Abdomen: The gastrostomy tube was removed and the balloon had ruptured without anything anchoring the feeding tube in place.  The Mickey low-profile PEG was inserted in its place and its balloon was filled with 5 mL of normal saline. Labs:    Imaging Studies: No results found.  Assessment and Plan:   Crystal Kent is a 74 y.o. y/o female who comes in with a malfunctioning PEG tube.  The patient reports that she had pneumonia and was thought to have aspiration of gastric contents.  They would like to go back to the low-profile PEG tube instead of the tube she has in now.  I have explained to the husband and the patient that this can cause her to have aspiration of the gastric contents again and pneumonia.  They insist on having the tube that is in place now removed and the low-profile PEG placed despite me explaining to them the risk of recurrent pneumonia with aspiration and death.  The consented and insisted on having the Low Profile PEG which they have been using for 20 years inserted.  Their wishes were complied with.     Lucilla Lame, MD. [place] [facility]    Note: This dictation was prepared with Dragon dictation along with smaller phrase technology. Any transcriptional errors that result from this process are unintentional.

## 2020-07-31 ENCOUNTER — Other Ambulatory Visit: Payer: Self-pay | Admitting: Internal Medicine

## 2020-08-03 IMAGING — CR DG CHEST 2V
3 series · 3 of 3 positions shown · non-contrast
Comparison: 12/29/2017.  11/18/2017.

CLINICAL DATA: History of it neck cancer.  Cough.

EXAM:
CHEST - 2 VIEW

[chest pa]
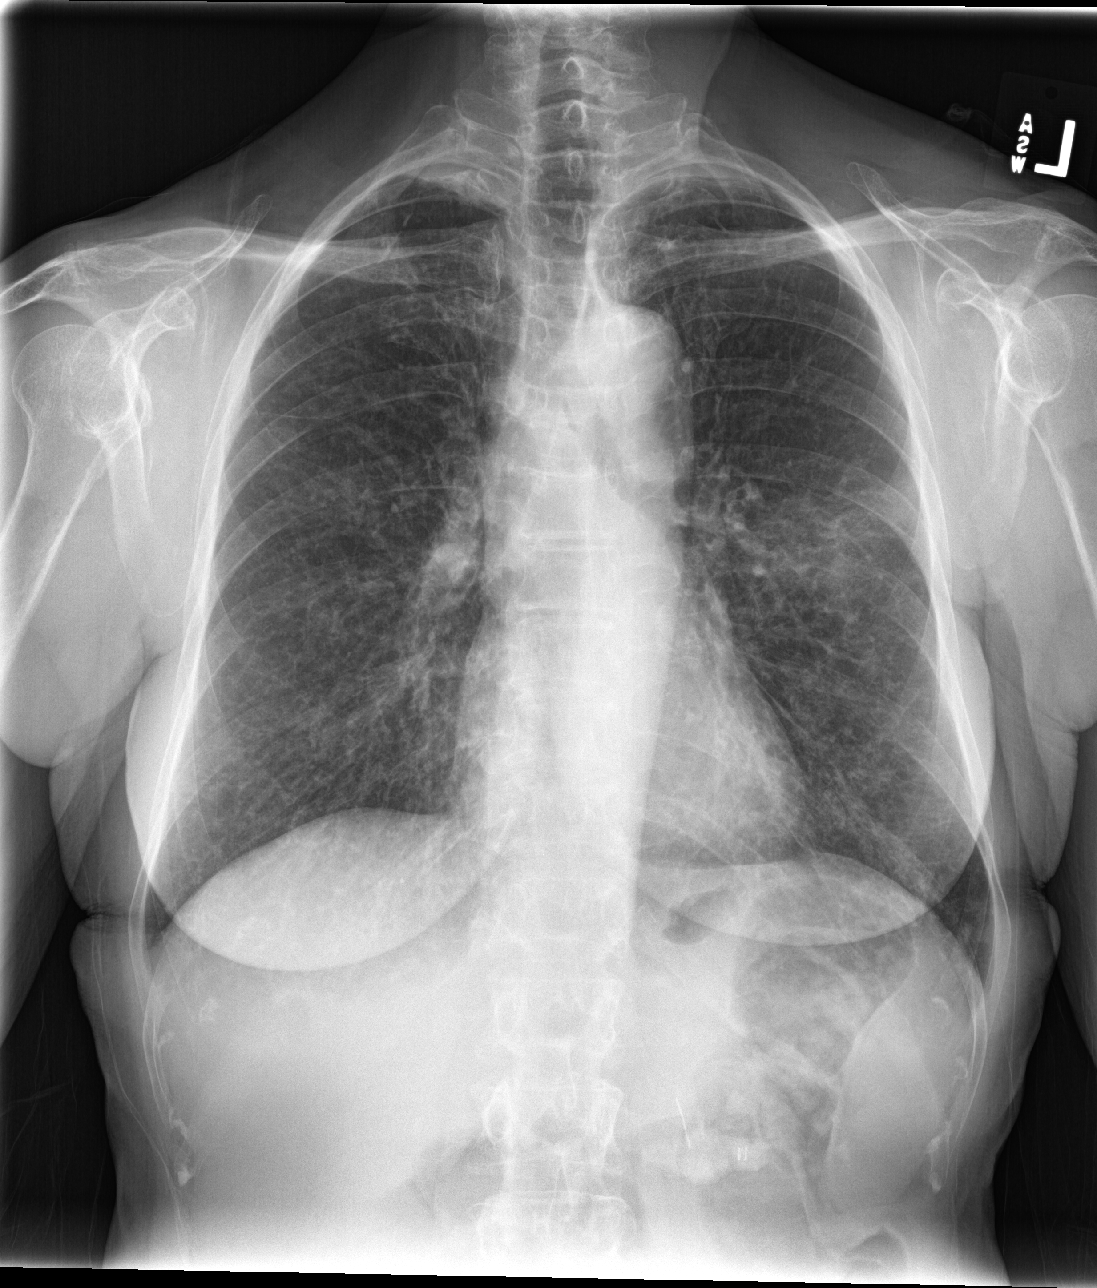

[chest lat (1 of 2)]
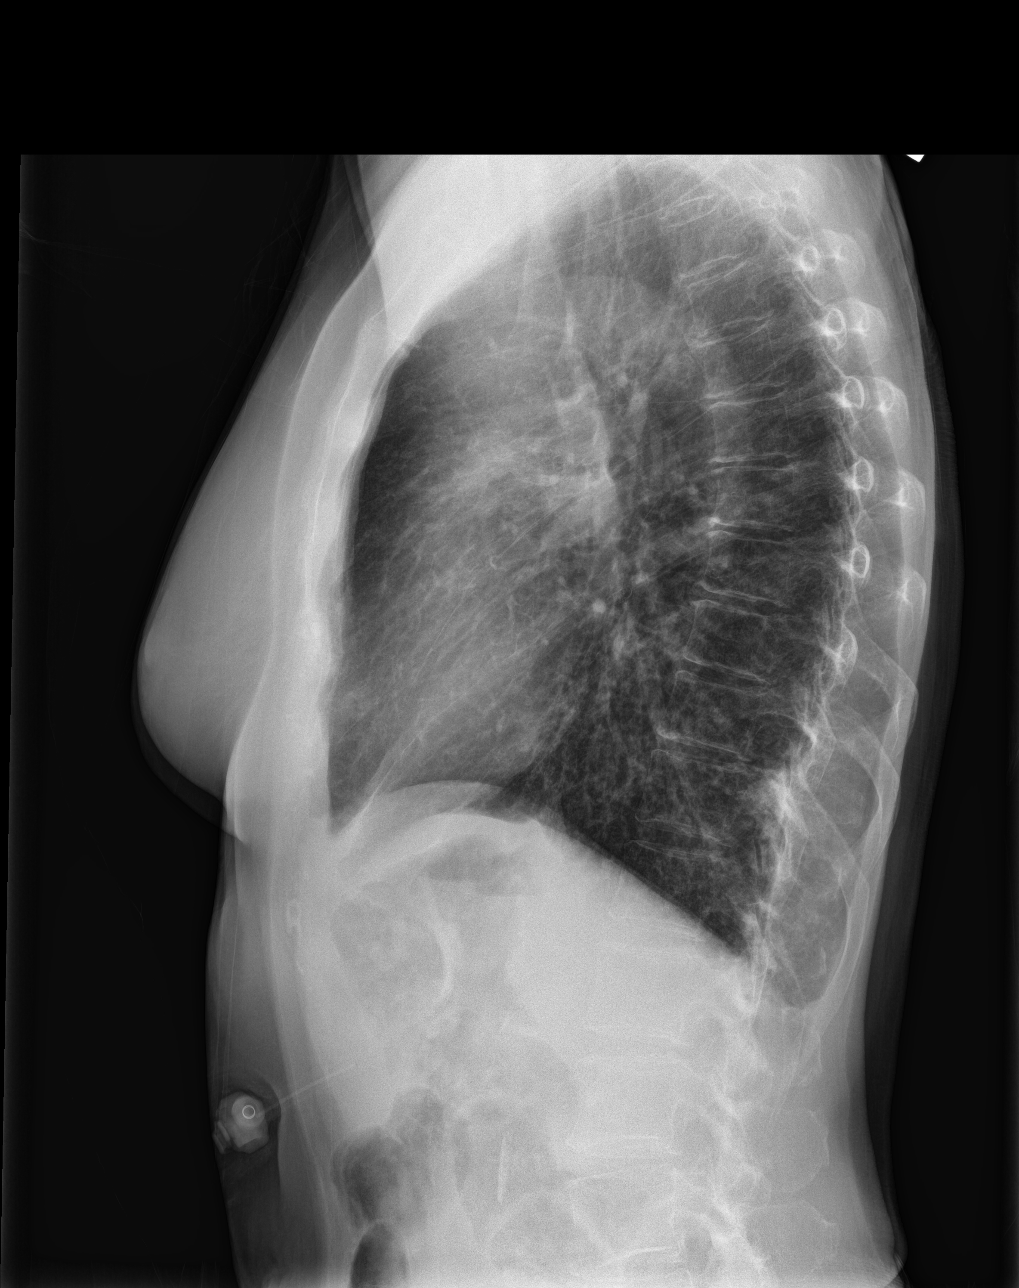

[chest lat (2 of 2)]
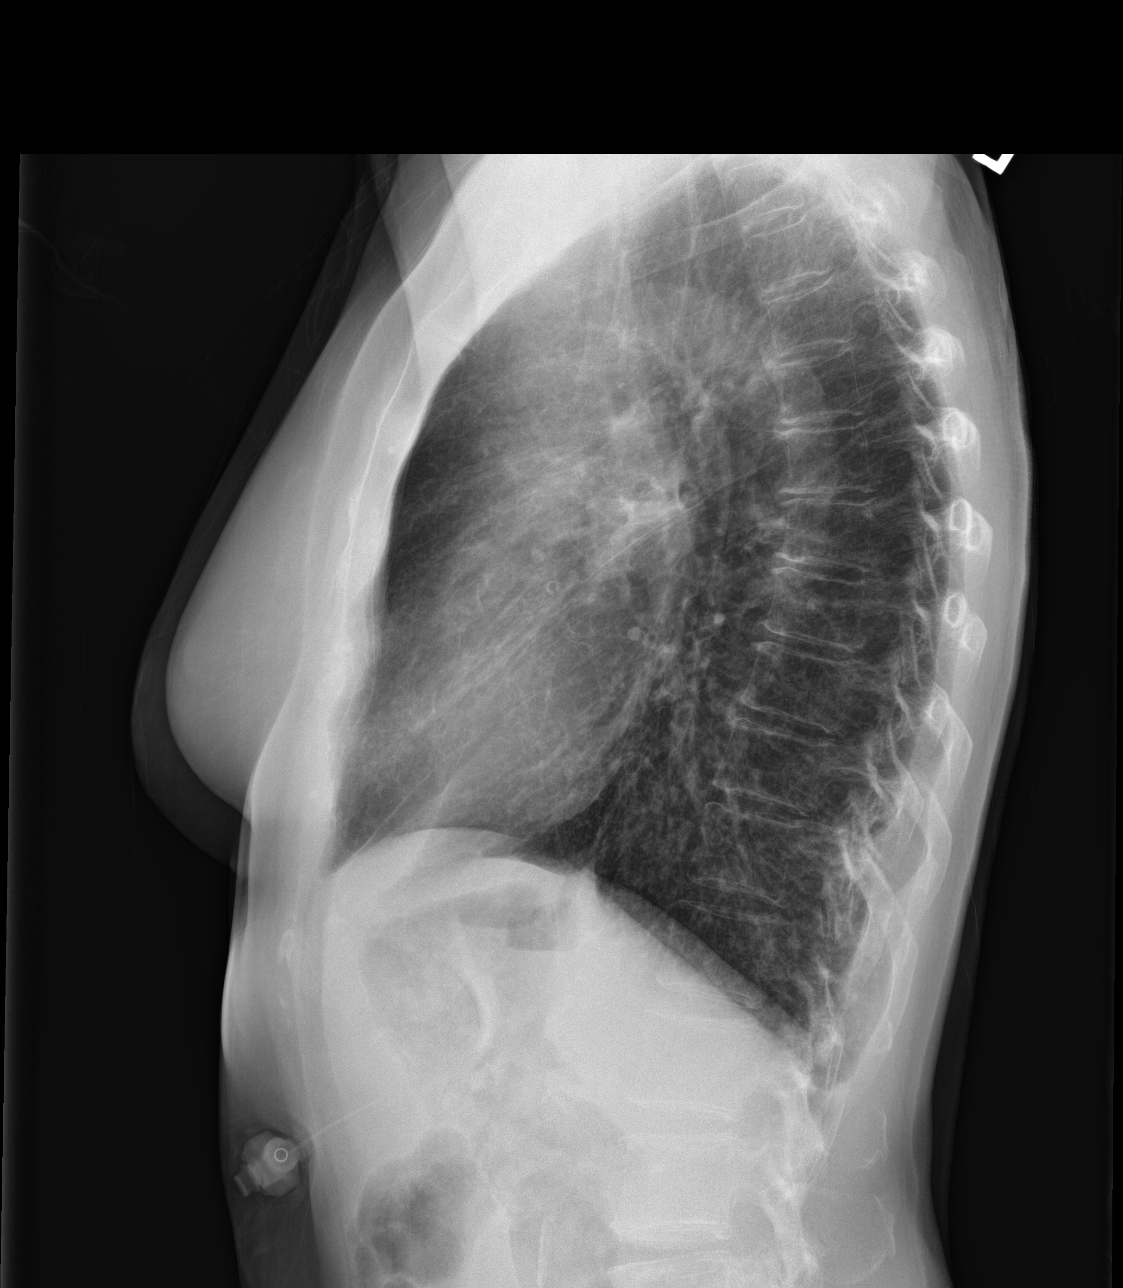

[3 of 3 positions shown; findings below may reference images not displayed]

FINDINGS: Mediastinum and hilar structures are stable. Heart size normal.
Focal alveolar infiltrate in the left mid lung. Diffuse interstitial
prominence noted. And active interstitial lung disease including
pneumonitis/interstitial edema could present this fashion. Other
etiologies of interstitial prominence including interstitial tumor
spread can not be excluded. Tiny right pleural effusion. No
pneumothorax. No acute bony abnormality. No acute bony abnormality
identified gastrostomy tube noted..
IMPRESSION: 1. Focal alveolar infiltrate left mid lung most consistent with
pneumonia.

2. Diffuse bilateral from interstitial prominence noted. And active
interstitial process including pneumonitis/interstitial edema could
present in this fashion. Other etiologies of interstitial prominence
including interstitial tumor spread can not be excluded. Tiny right
pleural effusion.

Close follow-up chest x-rays to demonstrate resolution of these
findings suggested.

## 2020-09-19 DEATH — deceased
